# Patient Record
Sex: Male | Born: 1975
Health system: Southern US, Community
[De-identification: ages and names within clinical notes are randomized; demographics above are authoritative.]

## PROBLEM LIST (undated history)

## (undated) DIAGNOSIS — F419 Anxiety disorder, unspecified: Secondary | ICD-10-CM

## (undated) DIAGNOSIS — F32A Depression, unspecified: Secondary | ICD-10-CM

## (undated) DIAGNOSIS — R454 Irritability and anger: Secondary | ICD-10-CM

## (undated) DIAGNOSIS — K5721 Diverticulitis of large intestine with perforation and abscess with bleeding: Secondary | ICD-10-CM

## (undated) DIAGNOSIS — A6000 Herpesviral infection of urogenital system, unspecified: Secondary | ICD-10-CM

## (undated) DIAGNOSIS — E119 Type 2 diabetes mellitus without complications: Secondary | ICD-10-CM

## (undated) DIAGNOSIS — K5792 Diverticulitis of intestine, part unspecified, without perforation or abscess without bleeding: Secondary | ICD-10-CM

## (undated) DIAGNOSIS — I10 Essential (primary) hypertension: Secondary | ICD-10-CM

## (undated) HISTORY — DX: Diverticulitis of large intestine with perforation and abscess with bleeding: K57.21

## (undated) HISTORY — DX: Herpesviral infection of urogenital system, unspecified: A60.00

## (undated) HISTORY — DX: Irritability and anger: R45.4

---

## 1997-06-03 HISTORY — PX: REFRACTIVE SURGERY: SHX103

## 2005-06-26 ENCOUNTER — Ambulatory Visit: Payer: Self-pay | Admitting: General Surgery

## 2005-07-01 ENCOUNTER — Ambulatory Visit: Payer: Self-pay | Admitting: General Surgery

## 2005-07-25 ENCOUNTER — Ambulatory Visit: Payer: Self-pay | Admitting: General Surgery

## 2006-04-28 ENCOUNTER — Ambulatory Visit: Payer: Self-pay | Admitting: Internal Medicine

## 2007-03-19 ENCOUNTER — Emergency Department: Payer: Self-pay | Admitting: Emergency Medicine

## 2007-04-15 ENCOUNTER — Ambulatory Visit: Payer: Self-pay | Admitting: Gastroenterology

## 2013-10-28 ENCOUNTER — Ambulatory Visit: Payer: Self-pay | Admitting: Nurse Practitioner

## 2013-11-12 DIAGNOSIS — M7550 Bursitis of unspecified shoulder: Secondary | ICD-10-CM | POA: Insufficient documentation

## 2013-11-12 DIAGNOSIS — M7580 Other shoulder lesions, unspecified shoulder: Secondary | ICD-10-CM | POA: Insufficient documentation

## 2014-03-01 ENCOUNTER — Emergency Department: Payer: Self-pay | Admitting: Emergency Medicine

## 2014-03-01 LAB — URINALYSIS, COMPLETE
BACTERIA: NONE SEEN
Bilirubin,UR: NEGATIVE
Blood: NEGATIVE
Glucose,UR: NEGATIVE mg/dL (ref 0–75)
KETONE: NEGATIVE
LEUKOCYTE ESTERASE: NEGATIVE
NITRITE: NEGATIVE
Ph: 7 (ref 4.5–8.0)
Protein: NEGATIVE
RBC,UR: NONE SEEN /HPF (ref 0–5)
SPECIFIC GRAVITY: 1.016 (ref 1.003–1.030)
SQUAMOUS EPITHELIAL: NONE SEEN
WBC UR: NONE SEEN /HPF (ref 0–5)

## 2014-03-01 LAB — CBC WITH DIFFERENTIAL/PLATELET
Basophil #: 0 10*3/uL (ref 0.0–0.1)
Basophil %: 0.4 %
Eosinophil #: 0.1 10*3/uL (ref 0.0–0.7)
Eosinophil %: 1.2 %
HCT: 49.8 % (ref 40.0–52.0)
HGB: 15.9 g/dL (ref 13.0–18.0)
LYMPHS PCT: 12.4 %
Lymphocyte #: 1.4 10*3/uL (ref 1.0–3.6)
MCH: 29.4 pg (ref 26.0–34.0)
MCHC: 32 g/dL (ref 32.0–36.0)
MCV: 92 fL (ref 80–100)
MONO ABS: 1.3 x10 3/mm — AB (ref 0.2–1.0)
MONOS PCT: 11.6 %
NEUTROS ABS: 8.4 10*3/uL — AB (ref 1.4–6.5)
NEUTROS PCT: 74.4 %
Platelet: 338 10*3/uL (ref 150–440)
RBC: 5.42 10*6/uL (ref 4.40–5.90)
RDW: 14.2 % (ref 11.5–14.5)
WBC: 11.3 10*3/uL — ABNORMAL HIGH (ref 3.8–10.6)

## 2014-03-01 LAB — BASIC METABOLIC PANEL
Anion Gap: 7 (ref 7–16)
BUN: 12 mg/dL (ref 7–18)
CALCIUM: 9.1 mg/dL (ref 8.5–10.1)
CHLORIDE: 104 mmol/L (ref 98–107)
CREATININE: 0.65 mg/dL (ref 0.60–1.30)
Co2: 25 mmol/L (ref 21–32)
EGFR (African American): >60
GLUCOSE: 90 mg/dL (ref 65–99)
OSMOLALITY: 271 (ref 275–301)
POTASSIUM: 3.8 mmol/L (ref 3.5–5.1)
Sodium: 136 mmol/L (ref 136–145)

## 2014-03-01 LAB — TROPONIN I

## 2014-07-23 ENCOUNTER — Inpatient Hospital Stay: Payer: Self-pay | Admitting: Internal Medicine

## 2014-09-02 HISTORY — PX: COLONOSCOPY W/ POLYPECTOMY: SHX1380

## 2014-09-22 ENCOUNTER — Ambulatory Visit
Admit: 2014-09-22 | Disposition: A | Discharge status: Home / Self Care | Payer: Self-pay | Attending: Gastroenterology | Admitting: Gastroenterology

## 2014-09-22 LAB — HM COLONOSCOPY

## 2014-10-02 NOTE — Consult Note (Signed)
Brief Consult Note: Diagnosis: LGI bleed.   Comments: Pt currently in angio for embolization. I have not seen him. I've noted his multiple bloody BMs today and tagged RBC scan with splenic flexure positivity, and Hct drop. I have ordered coags and labs and we'll see him in the AM. Hopefully this procedure will be successful.  Electronic Signatures: Consuela Mimes (MD)  (Signed [phone] 17:14)  Authored: Brief Consult Note   Last Updated: 22-Feb-16 17:14 by Consuela Mimes (MD)

## 2014-10-02 NOTE — Consult Note (Signed)
Pt seen and examined. THis is his 4th bout of diverticulitis since 2008. Last colon in 2008 showed sigmoid diverticulosis with possible diverticulitis then. Avoid seeds and nuts. Starting to feel better already. On liquid diet. Still has some low abdominal tenderness. If stable, can try low residue diet by tomorrow. If tolerates solids, patient can be discharged on low residue diet x 1 week plus oral Abx. Needs repeat colonoscopy as outpt in a month or so. Pt should consider elective sigmoid colon resection as opposed to possible emergency colon surgery later in life. Thanks.  Electronic Signatures: Verdie Shire (MD)  (Signed on 20-Feb-16 12:04)  Authored  Last Updated: 20-Feb-16 12:04 by Verdie Shire (MD)

## 2014-10-02 NOTE — H&P (Signed)
PATIENT NAME:  Neil Mcintyre, BRICKLE MR#:  [id number] DATE OF BIRTH:  Nov 01, 1975  DATE OF ADMISSION:  07/23/2014  PRIMARY CARE PHYSICIAN: Derinda Late, MD   REFERRING EMERGENCY ROOM PHYSICIAN: Baird Cancer. Quale, MD  CHIEF COMPLAINT: Blood in the stool.   HISTORY OF PRESENTING ILLNESS: A 39 year old male who has a history of diverticula in the past and had bleeding, but last one was more than a few years ago. States that at 10:00 last night, he started suddenly having bleeding with his stool and he had 3 episodes like that overnight, so decided to come to the Emergency Room. Now he is in the ER with a stable hemoglobin but the episode of bleeding. The ER physician checked his CT scan, which showed diverticulitis. He WBC count is also slightly elevated, so given to hospitalist team for further management. On questioning, he denies any fever or chills. He denies any nausea or vomiting. He had slight discomfort in his abdomen on the left lower side. Denies any pain medications.   REVIEW OF SYSTEMS:  CONSTITUTIONAL: Negative for fever, fatigue, weakness, pain, or weight loss.  EYES: No blurring, double vision, discharge, or redness.  EARS, NOSE, THROAT: No tinnitus, ear pain, or hearing loss.  RESPIRATORY: No cough, wheezing, hemoptysis, or shortness of breath.  CARDIOVASCULAR: No chest pain, orthopnea, edema, arrhythmia, palpitation.  GASTROINTESTINAL: The patient does not have any nausea or vomiting, diarrhea, but had mild abdominal discomfort and some blood in the stool.  GENITOURINARY: No dysuria, hematuria, increased frequency.  ENDOCRINE: No heat or cold intolerance. No excessive sweating.  SKIN: No acne, rashes, or lesions.  MUSCULOSKELETAL: No pain or swelling in the joints.  NEUROLOGICAL: No numbness, weakness, tremor, or vertigo.  PSYCHIATRIC: No anxiety, insomnia, bipolar disorder.   PAST MEDICAL HISTORY:  1.  Hypertension.  2.  Anxiety.  3.  Herpes.   PAST SURGICAL HISTORY: Laser eye  surgery.   SOCIAL HISTORY: He does not smoke. Drinks alcohol socially. No illegal drug use.   FAMILY HISTORY: Unknown, because the patient was adopted.   HOME MEDICATIONS:  1.  Valacyclovir 500 mg oral once a day.  2.  Lorazepam 0.5 mg oral 1-2 tablets 2 times a day as needed.  3.  Escitalopram 10 mg oral tablet once a day.  4.  Amlodipine 5 mg orally once a day.   PHYSICAL EXAMINATION:  VITAL SIGNS: In ER, temperature 98.1; pulse rate is 122 on presentation, but currently 95; respiratory rate 19; blood pressure 175/114; and oxygen saturation 100% on room air.  GENERAL: The patient is fully alert and oriented to time, place, and person. Does not appear in any acute distress.  HEENT: Head and neck atraumatic. Conjunctivae pink. Oral mucosa moist.  NECK: Supple. No JVD. Thyroid nontender.  RESPIRATORY: Bilateral equal and clear air entry. No wheezing or crepitation.  CARDIOVASCULAR: S1, S2 present, regular. No murmur.  ABDOMEN: Soft, nontender. Bowel sounds present. No organomegaly.  SKIN: No acne, rashes, or lesions.  MUSCULOSKELETAL: No pain or swelling in the joints.  NEUROLOGICAL: No numbness, weakness, tremor, or vertigo.  PSYCHIATRIC: No  insomnia, bipolar disorder.  NEUROLOGICAL: Power 5/5. Follows commands. No gross abnormality. Sensation is intact. No tremor or rigidity.  JOINTS: No tenderness or swelling.   IMPORTANT LABORATORY RESULTS: Glucose 133, BUN 15, creatinine 0.73, sodium 141, potassium 4.2, chloride 108, CO2 of 26, calcium 9.1. Total protein 7.3, albumin 3.4, bilirubin 0.4, alkaline phosphate 80, SGOT 24, SGPT 28. WBC 12.8, hemoglobin 15.8, platelet count 272,000, MCV  is 90. CT scan of the abdomen and pelvis with contrast shows acute diverticulitis mild sigmoid colon, adjacent inflamed loop of small bowel may be secondary involved in adjacent inflammation. No abscess. No bowel obstruction. No extra luminal air.   ASSESSMENT AND PLAN: A 39 year old male who has history  of diverticular bleed in the past, came to hospital with complaint of rectal bleeding 3 times.  1.  Acute diverticulitis: We will give Cipro and Flagyl. We will keep him on liquid diet and call gastroenterology consult for further management.  2.  Gastrointestinal bleed: We will follow serial hemoglobin. Currently, hemoglobin is stable, so no need for a blood transfusion. Will continue monitoring. Call gastroenterology for further management.  3.  Hypertension: We will continue amlodipine.  4.  Herpes: Continue valacyclovir.  5.  Anxiety: Continue Xanax on an as-needed basis.   CODE STATUS: Full code.   TOTAL TIME SPENT ON THIS ADMISSION: 50 minutes.     ____________________________ Ceasar Lund Anselm Jungling, MD vgv:bm D: 07/23/2014 05:24:26 ET T: 07/23/2014 06:06:35 ET JOB#: [id number]  cc: Ceasar Lund. Anselm Jungling, MD, <Dictator> Derinda Late, MD Vaughan Basta MD ELECTRONICALLY SIGNED 08/15/2014 12:56

## 2014-10-02 NOTE — Consult Note (Signed)
CHIEF COMPLAINT and HISTORY:  Subjective/Chief Complaint Bleeding per rectum   History of Present Illness Patient with a history of diverticular disease admitted with significant lower GI bleeding.  His Hgb is actually down to 8.7 currently.  he has minimal discomfort.  Has N/V or constipation.  Stools are a little loose.  No inciting events and no alleviating events.  Bleeding scan was done which demonstrated positive bleeding in left colon and splenic flexure area.  I have independently reviewed this scan.  We are consulted for further evaluation and consideratio for embolization.   PAST MEDICAL/SURGICAL HISTORY:  Past Medical History:   Herpes:    anxiety:    hypertension:    diverticulosis:   ALLERGIES:  Allergies:  No Known Allergies:   HOME MEDICATIONS:  Home Medications: Medication Instructions Status  amLODIPine 5 mg oral tablet 1 tab(s) orally once a day Active  LORazepam 0.5 mg oral tablet 1-2 tab(s) orally 2 times a day, As Needed Active  valACYclovir 500 mg oral tablet 1 tab(s) orally once a day Active  Cymbalta 30 mg oral delayed release capsule 1 tab(s) orally once a day Active   Family and Social History:  Family History unknown as he was adopted   Social History negative tobacco, negative Illicit drugs, social ETOH   Review of Systems:  Subjective/Chief Complaint No TIA/stroke/seizure No heat or cold intolerance No dysuria/hematuria No blurry or double vision No tinnitus or ear pain No rashes or ulcer No suicidal ideation or psychosis No signs of easy bruising.  Bleeding per HPI No SOB/DOE, orthopnea, or sputum No palpitations or chest pain No N/V.  Bleeding per HPI No joint pain or joint swelling No fever or chills No unintentional weight loss or gain   Medications/Allergies Reviewed Medications/Allergies reviewed   Physical Exam:  GEN well developed, well nourished, no acute distress   HEENT pink conjunctivae, PERRL, hearing intact to voice    NECK No masses  trachea midline   RESP normal resp effort  no use of accessory muscles   CARD No LE edema  no JVD  tachycardic   VASCULAR ACCESS none   ABD soft  normal BS   GU no superpubic tenderness   LYMPH negative neck, negative axillae   EXTR negative cyanosis/clubbing, negative edema, pedal pulses palpable   SKIN normal to palpation, skin turgor good   NEURO cranial nerves intact, follows commands, motor/sensory function intact   PSYCH alert, A+O to time, place, person, good insight   LABS:  Laboratory Results: Hepatic:    19-Feb-16 23:21, Comprehensive Metabolic Panel  Bilirubin, Total 0.4  Alkaline Phosphatase 80  SGPT (ALT) 28  SGOT (AST) 24  Total Protein, Serum 7.3  Albumin, Serum 3.4  Routine BB:    19-Feb-16 23:21, Crossmatch 1 Unit  Crossmatch Unit 1 Issued  Result(s) reported on 25 Jul 2014 at 03:38PM.    19-Feb-16 23:21, Type and Antibody Screen  ABO Group + Rh Type   O Positive  Antibody Screen NEGATIVE  Result(s) reported on 23 Jul 2014 at 06:33AM.  Routine Micro:    20-Feb-16 06:13, Clostridium Difficile  Micro Text Report   CLOSTRIDIUM DIFFICILE    C.DIFFICILE ANTIGEN       C.DIFFICILE GDH ANTIGEN : NEGATIVE    C.DIFFICILE TOXIN A/B     C.DIFFICILE TOXINS A AND B : NEGATIVE    INTERPRETATION            Negative for C. difficile.      ANTIBIOTIC  Routine  Chem:    19-Feb-16 23:21, Comprehensive Metabolic Panel  Glucose, Serum 133  BUN 15  Creatinine (comp) 0.73  Sodium, Serum 141  Potassium, Serum 4.2  Chloride, Serum 108  CO2, Serum 26  Calcium (Total), Serum 9.1  Osmolality (calc) 284  eGFR (African American) >60  eGFR (Non-African American) >60  eGFR values <22m/min/1.73 m2 may be an indication of chronic  kidney disease (CKD).  Calculated eGFR, using the MRDR Study equation, is useful in   patients with stable renal function.  The eGFR calculation will not be reliable in acutely ill patients  when serum creatinine is  changing rapidly. It is not useful in  patients on dialysis. The eGFR calculation may not be applicable  to patients at the low and high extremes of body sizes, pregnant  women, and vegetarians.  Result Comment   POTASSIUM/BUN/AST - Slight hemolysis, interpret results with   - caution.   Result(s) reported on 22 Jul 2014 at 11:59PM.  Anion Gap 7    21-Feb-16 075:17 Basic Metabolic Panel (w/Total Calcium)  Glucose, Serum 102  BUN 12  Creatinine (comp) 0.75  Sodium, Serum 140  Potassium, Serum 3.8  Chloride, Serum 108  CO2, Serum 28  Calcium (Total), Serum 7.8  Anion Gap 4  Osmolality (calc) 279  eGFR (African American) >60  eGFR (Non-African American) >60  eGFR values <651mmin/1.73 m2 may be an indication of chronic  kidney disease (CKD).  Calculated eGFR, using the MRDR Study equation, is useful in   patients with stable renal function.  The eGFR calculation will not be reliable in acutely ill patients  when serum creatinine is changing rapidly. It is not useful in  patients on dialysis. The eGFR calculation may not be applicable  to patients at the low and high extremes of body sizes, pregnant  women, and vegetarians.    22-Feb-16 0900:17Basic Metabolic Panel (w/Total Calcium)  Glucose, Serum 103  BUN 7  Creatinine (comp) 0.79  Sodium, Serum 140  Potassium, Serum 3.9  Chloride, Serum 105  CO2, Serum 28  Calcium (Total), Serum 8.5  Anion Gap 7  Osmolality (calc) 278  eGFR (African American) >60  eGFR (Non-African American) >60  eGFR values <6095min/1.73 m2 may be an indication of chronic  kidney disease (CKD).  Calculated eGFR, using the MRDR Study equation, is useful in   patients with stable renal function.  The eGFR calculation will not be reliable in acutely ill patients  when serum creatinine is changing rapidly. It is not useful in  patients on dialysis. The eGFR calculation may not be applicable  to patients at the low and high extremes of body sizes,  pregnant  women, and vegetarians.  Routine Hem:    19-Feb-16 23:21, Hemogram, Platelet Count  WBC (CBC) 12.8  RBC (CBC) 5.32  Hemoglobin (CBC) 15.8  Hematocrit (CBC) 47.9  Platelet Count (CBC) 272  Result(s) reported on 22 Jul 2014 at 11:41PM.  MCV 90  MCH 29.7  MCHC 33.0  RDW 14.4    20-Feb-16 11:27, Hemoglobin  Hemoglobin (CBC) 12.2  Result(s) reported on 23 Jul 2014 at 11:45AM.    21-Feb-16 03:44, CBC Profile  WBC (CBC) 11.0  RBC (CBC) 3.24  Hemoglobin (CBC) 9.8  Hematocrit (CBC) 29.7  Platelet Count (CBC) 199  MCV 92  MCH 30.2  MCHC 33.0  RDW 14.3  Neutrophil % 65.9  Lymphocyte % 18.1  Monocyte % 13.5  Eosinophil % 2.1  Basophil % 0.4  Neutrophil # 7.3  Lymphocyte #  2.0  Monocyte # 1.5  Eosinophil # 0.2  Basophil # 0.0  Result(s) reported on 24 Jul 2014 at 04:11AM.    22-Feb-16 03:29, Hemoglobin  Hemoglobin (CBC) 9.6  Result(s) reported on 25 Jul 2014 at 04:11AM.    22-Feb-16 09:40, CBC Profile  WBC (CBC) 9.1  RBC (CBC) 3.34  Hemoglobin (CBC) 9.8  Hematocrit (CBC) 29.9  Platelet Count (CBC) 252  MCV 90  MCH 29.3  MCHC 32.6  RDW 14.1  Neutrophil % 69.5  Lymphocyte % 15.6  Monocyte % 13.0  Eosinophil % 1.5  Basophil % 0.4  Neutrophil # 6.3  Lymphocyte # 1.4  Monocyte # 1.2  Eosinophil # 0.1  Basophil # 0.0  Result(s) reported on 25 Jul 2014 at 09:54AM.    22-Feb-16 11:08, Hemoglobin  Hemoglobin (CBC) 8.7  Result(s) reported on 25 Jul 2014 at 11:27AM.   RADIOLOGY:  Radiology Results: LabUnknown:    28-May-15 14:56, MRI Shoulder Left Without Contrast  PACS Image    20-Feb-16 04:09, CT Abdomen and Pelvis With Contrast  PACS Image    22-Feb-16 14:14, GI Blood Loss Study - Nuc Med  PACS Image  MRI:    28-May-15 14:56, MRI Shoulder Left Without Contrast  MRI Shoulder Left Without Contrast  REASON FOR EXAM:    Possible biceps tendon rupture left  COMMENTS:       PROCEDURE: MR  - MR SHOULDER LT  WO CONTRAST  - Oct 28 2013  2:56PM      CLINICAL DATA:  biceps tendon rupture. Injury 2 months ago lifting  an object.    EXAM:  MRI OF THE LEFT SHOULDER WITHOUT CONTRAST    TECHNIQUE:  Multiplanar, multisequence MR imaging of the shoulder was performed.  No intravenous contrast was administered.  COMPARISON:  None.    FINDINGS:  Rotator cuff: Supraspinatus tendinopathy is present without tear.  Infraspinatus and teres minor tendons appear normal. The  subscapularis tendon appears normal.    Muscles:  Normal.  No atrophy or edema.    Biceps long head: Intact. Insertion on the biceps labral complex  appears normal.    Acromioclavicular Joint: Type 3 acromion. Active AC joint  osteoarthritis is present with a small effusion, appear articular  inflammation, subacromial bursitis and bone marrow edema on both  sides of the joint.    Glenohumeral Joint: Normal.    Labrum:  Normal.    Bones:No destructive osseous lesions.     IMPRESSION:  1. Intact biceps long head tendon.  2. Supraspinatus tendinopathy without tear.  3. Active AC joint osteoarthritis, with bone marrow edema on both  sides of the joint. Subacromial bursitis and type 3 acromion.    Electronically Signed    By: Dereck Ligas M.D.    On: 10/28/2013 15:17         Verified By: Melvyn Novas, M.D.,  CT:    20-Feb-16 04:09, CT Abdomen and Pelvis With Contrast  CT Abdomen and Pelvis With Contrast  REASON FOR EXAM:    (1) LLQ pain; (2) same  COMMENTS:   May transport without cardiac monitor    PROCEDURE: CT  - CT ABDOMEN / PELVIS  W  - Jul 23 2014  4:09AM     CLINICAL DATA:  Rectal bleeding. Abdominal discomfort. Leukocytosis.    EXAM:  CT ABDOMEN AND PELVIS WITH CONTRAST    TECHNIQUE:  Multidetector CT imaging of the abdomen and pelvis was performed  using the standard protocol following bolus administration of  intravenous contrast.  CONTRAST:  100 mL Omnipaque 300 intravenous    COMPARISON:  04/28/2006    FINDINGS:  There  is acute inflammatory change in the mid sigmoid colon with  appearances typical of acute diverticulitis. No extraluminal air is  evident. There is no abscess. There is very extensive diverticulosis  throughout the sigmoid and descending colon. There is a prominent  small bowel loop directly adjacent to the inflamed sigmoid. The  small bowel loop may be secondarily involved in the inflammation  there is no small bowel obstruction. Remainder of the small bowel  appears normal. The appendix is normal.    There is no ascites. There are normal appearances of the liver,  spleen, pancreas, adrenals and kidneys. There is no adenopathy. No  significant abnormalities are evident in the lower chest.     IMPRESSION:  Acute diverticulitis, mid sigmoid colon. Adjacent inflamed loop of  small bowel may be secondarily involved in the adjacent  inflammation. No abscess. No bowel obstruction. No extraluminal air.      Electronically Signed    By: Andreas Newport M.D.  On: 07/23/2014 04:29         Verified By: Andreas Newport, M.D.,  Nuclear Med:    [phone] 14:14, GI Blood Loss Study - Nuc Med  GI Blood Loss Study - Nuc Med  REASON FOR EXAM:    ACTIVE BLEEDING  COMMENTS:       PROCEDURE: NM  - NM GI BLOOD LOSS STUDY  - Jul 25 2014  2:14PM     CLINICAL DATA:  Active GI bleeding.    EXAM:  NUCLEAR MEDICINE GASTROINTESTINAL BLEEDING SCAN    TECHNIQUE:  Sequential abdominal images were obtained following intravenous  administration of Tc-62mlabeled red blood cells.    RADIOPHARMACEUTICALS:  22.0 mCi Tc-980mn-vitro labeled red cells.  COMPARISON:  Abdominal CT 05/23/2015    FINDINGS:  There is accumulation of the radiopharmaceutical in the left upper  quadrant. This activity moves into the left lower abdomen and  appears to be within the splenic flexure and descending colon. There  is expected uptake in the vascular structures and liver. There is  accumulation of the  radiopharmaceutical in the pelvis and this is  thought to represent urinary bladder rather than sigmoid colon.     IMPRESSION:  Positive GI bleeding study. The area bleeding is near the splenic  flexure and proximal descending colon.    Electronically Signed    By: AdMarkus Daft.D.    On: 07/25/2014 14:30         Verified By: ADBurman RiisM.D.,   ASSESSMENT AND PLAN:  Assessment/Admission Diagnosis lower GI bleed with positive bleeding scan, likely diverticular in nature   Plan Bleeding scan was done which demonstrated positive bleeding in left colon and splenic flexure area.  I have independently reviewed this scan.  I have discussed options including observation, embolization, or surgery.  Surgery would be most definitive, but most invasive.  Already anemic and active bleeding, so observation likely not a good option.  discussed risks and benefits of embolization.  Discussed ischemic complications and rebleeding risk.  He is interested in proceeding with embolization and will plan later today.     level 4 consult   Electronic Signatures: DeAlgernon HuxleyMD)  (Signed [phone] [id number])  Authored: Chief Complaint and History, PAST MEDICAL/SURGICAL HISTORY, ALLERGIES, HOME MEDICATIONS, Family and Social History, Review of Systems, Physical Exam, LABS, RADIOLOGY, Assessment and Plan  Last Updated: 22-Feb-16 15:56 by Algernon Huxley (MD)

## 2014-10-02 NOTE — Op Note (Signed)
PATIENT NAME:  Neil Mcintyre, Neil Mcintyre MR#:  [id number] DATE OF BIRTH:  Jun 06, 1975  DATE OF PROCEDURE:  07/25/2014  PREOPERATIVE DIAGNOSIS:  Brisk lower GI bleeding likely from diverticulosis with positive bleeding scan specific to the splenic flexure and left colon.   POSTOPERATIVE DIAGNOSIS:  Brisk lower GI bleeding likely from diverticulosis with positive bleeding scan specific to the splenic flexure and left colon.   PROCEDURES:  1.  Ultrasound guidance for vascular access to right femoral artery.  2.  Catheter placement into third order IMA branch to the splenic flexure.  3.  Aortogram and selective inferior mesenteric artery arteriogram.  4.  Microbead embolization with 300-500 micron polyvinyl alcohol beads to the splenic flexure and left colon.   SURGEON: Algernon Huxley, M.D.   ANESTHESIA: Local with moderate conscious sedation.   ESTIMATED BLOOD LOSS: 25 mL   CONTRAST: 50 mL Visipaque    INDICATION FOR PROCEDURE: This is a 39 year old gentleman who we were consulted on today for brisk lower GI bleeding. He had a bleeding scan which was positive for the splenic flexure and proximal left colon. We discussed with him options including observation, surgery and embolization. He preferred to have an embolization. Risks and benefits including ischemia, recurrent bleeding and other problems were discussed and informed consent was obtained.   DESCRIPTION OF PROCEDURE: The patient is brought to the vascular suite. Groins were shaved and prepped, and a sterile surgical field was created. The right femoral artery was visualized with ultrasound and found to be widely patent. It was then accessed under direct ultrasound guidance without difficulty with a Seldinger needle. A J-wire and 5 French sheath were placed. Pigtail catheter was placed into the aorta and RAO and steep LAO projection were required as it ended up being a very steep LAO projection that allowed [place] to see the origin of the IMA.  Given his  left colon and splenic flexure location this was the artery in question. A VS-1 catheter was used to selectively cannulate the IMA and selective imaging was then performed. This showed primary branches of the IMA going to the rectosigmoid area, sigmoid, left colon off of the left colic branch was a very superior branch going to the splenic flexure. This was essentially a third order branch of the IMA. I then used a Progreat microcatheter to navigate through the primary branch, secondary branch and into the left colic branch and up into the tertiary branch being the splenic flexure branch. Imaging was performed which showed direct opacification of the splenic flexure with collateralization of the proximal left colon and the distal transverse colon. In this location approximately 2 mL of 300-500 micron polyvinyl alcohol beads were instilled and completion angiogram showed the main vessels to be open with less brisk flow into the area after embolization. I then removed the diagnostic catheters and placed j wire. After the femoral sheath was removed StarClose closure device was used in the usual fashion with excellent hemostatic result. The patient tolerated the procedure well and was taken to the recovery room in stable condition.    ____________________________ Algernon Huxley, MD jsd:mc D: 07/25/2014 17:53:02 ET T: 07/26/2014 09:44:39 ET JOB#: [id number]  cc: Algernon Huxley, MD, <Dictator> Algernon Huxley MD ELECTRONICALLY SIGNED 08/11/2014 10:34

## 2014-10-02 NOTE — Consult Note (Signed)
Chief Complaint:  Subjective/Chief Complaint Some bleeding yesterday and this AM. Clinically looks ok. NO abdomial pain. However, significant drop in hgb.   VITAL SIGNS/ANCILLARY NOTES: **Vital Signs.:   21-Feb-16 08:21  Vital Signs Type Q 8hr  Temperature Temperature (F) 97.9  Celsius 36.6  Temperature Source oral  Pulse Pulse 79  Respirations Respirations 18  Systolic BP Systolic BP 229  Diastolic BP (mmHg) Diastolic BP (mmHg) 92  Mean BP 112  Pulse Ox % Pulse Ox % 97  Pulse Ox Activity Level  At rest  Oxygen Delivery Room Air/ 21 %   Brief Assessment:  GEN no acute distress   Cardiac Regular   Respiratory clear BS   Gastrointestinal Normal   Lab Results: Routine Chem:  21-Feb-16 03:44   Glucose, Serum  102  BUN 12  Creatinine (comp) 0.75  Sodium, Serum 140  Potassium, Serum 3.8  Chloride, Serum  108  CO2, Serum 28  Calcium (Total), Serum  7.8  Anion Gap  4  Osmolality (calc) 279  eGFR (African American) >60  eGFR (Non-African American) >60 (eGFR values <35m/min/1.73 m2 may be an indication of chronic kidney disease (CKD). Calculated eGFR, using the MRDR Study equation, is useful in  patients with stable renal function. The eGFR calculation will not be reliable in acutely ill patients when serum creatinine is changing rapidly. It is not useful in patients on dialysis. The eGFR calculation may not be applicable to patients at the low and high extremes of body sizes, pregnant women, and vegetarians.)  Routine Hem:  21-Feb-16 03:44   WBC (CBC)  11.0  RBC (CBC)  3.24  Hemoglobin (CBC)  9.8  Hematocrit (CBC)  29.7  Platelet Count (CBC) 199  MCV 92  MCH 30.2  MCHC 33.0  RDW 14.3  Neutrophil % 65.9  Lymphocyte % 18.1  Monocyte % 13.5  Eosinophil % 2.1  Basophil % 0.4  Neutrophil #  7.3  Lymphocyte # 2.0  Monocyte #  1.5  Eosinophil # 0.2  Basophil # 0.0 (Result(s) reported on 24 Jul 2014 at 04:11AM.)   Assessment/Plan:  Assessment/Plan:   Assessment Diverticulitis. Drop in hgb either from hydration and/or further rectal bleeding.   Plan Hold off advancing diet to low residue diet. Moniter hgb for next 24 hrs. If bleeding does stop, then can try low residue diet tomorrow. Pt not a candidate for urgent colonoscopy. Will check back tomorrow. thanks   Electronic Signatures: OVerdie Shire(MD)  (Signed 21-Feb-16 10:52)  Authored: Chief Complaint, VITAL SIGNS/ANCILLARY NOTES, Brief Assessment, Lab Results, Assessment/Plan   Last Updated: 21-Feb-16 10:52 by OVerdie Shire(MD)

## 2014-10-02 NOTE — Consult Note (Signed)
PATIENT NAME:  Neil Mcintyre, Neil Mcintyre MR#:  [id number] DATE OF BIRTH:  09/13/1975  DATE OF CONSULTATION:  07/23/2014  REFERRING PHYSICIAN:   CONSULTING PHYSICIAN:  Lupita Dawn. Candace Cruise, MD  REASON FOR REFERRAL: Recurrent diverticulitis.   DESCRIPTION: The patient is a 39 year old, white male, who presents with rectal bleeding and abdominal pain. A CT scan showed evidence of acute diverticulitis. Therefore, the patient was brought in for antibiotic coverage and monitoring.   According to the patient, this is his fourth episode of diverticulitis. His first episode was in 2008 when he was seen by Dr. Sonny Masters. He had a colonoscopy that showed sigmoid diverticulosis with some evidence of diverticulitis. Ever since then, he has been avoiding seeds and nuts. Unfortunately, he keeps on having recurrent diverticulitis. Fortunately, for the patient, the patient is feeling better already. He is currently on a liquid diet and tolerating that so far.   REVIEW OF SYSTEMS: Please refer to the review of symptoms dictated on admission. There are no significant changes.   PAST MEDICAL HISTORY: Notable for anxiety, herpes and hypertension.   PAST SURGICAL HISTORY: Laser surgery for eyes.   SOCIAL HISTORY: There is no tobacco. The patient does drink alcohol socially.   FAMILY HISTORY: Negative since the patient is unknown because he was adopted.   MEDICATIONS AT HOME: Include Lorazepam as needed, amlodipine 5 mg daily, valacyclovir 500 mg daily.   PHYSICAL EXAMINATION:  GENERAL: The patient is in no acute distress.  VITAL SIGNS: His vital signs are stable. He is afebrile.  HEAD AND NECK: Within normal limits.  CARDIAC: Regular rhythm and rate without murmur.  LUNGS: Clear bilaterally.  ABDOMEN: Showed normoactive bowel sounds, soft. There is mild tenderness in the lower abdomen. He has good bowel sounds. There is no rebound or guarding.  EXTREMITIES: Showed no clubbing, cyanosis, or edema.  SKIN: Negative.  NEUROLOGICAL:  Examination is nonfocal.   LABORATORIES: White count is 12.8, hemoglobin 15.8. Electrolytes are all within normal limits. Liver enzymes are within normal limits.   CT scan of the abdomen showed acute inflammatory process around sigmoid colon consistent with typical acute diverticulitis.   ASSESSMENT AND PLAN: A patient with recurrent diverticulitis. He is doing better on antibiotics. If he stays stable, we will try a low residue diet by tomorrow. If he can tolerate a low residue diet, the patient can be discharged to home on a low residue diet for the next week or so. He can also continue oral antibiotics for at least a 7-day duration. Because this keeps on happening, the patient should consider elective surgical resection to prevent another episode like this since the patient is so young, at this time. This may prevent acute emergency surgery if he ever develops diverticulitis with complications.   Thank you for the referral.    ____________________________ Lupita Dawn. Candace Cruise, MD pyo:JT D: 07/24/2014 08:26:57 ET T: 07/24/2014 11:09:26 ET JOB#: [id number]  cc: Lupita Dawn. Candace Cruise, MD, <Dictator> Lupita Dawn Eldon Zietlow MD ELECTRONICALLY SIGNED 07/27/2014 10:51

## 2014-10-02 NOTE — Discharge Summary (Signed)
PATIENT NAME:  Neil Mcintyre, Neil Mcintyre MR#:  [id number] DATE OF BIRTH:  [birth year]  DATE OF ADMISSION:  07/23/2014 DATE OF DISCHARGE:  07/28/2014  ADMITTING PHYSICIAN: Vaughan Basta, MD  DISCHARGING PHYSICIAN: [name], MD  PRIMARY CARE PHYSICIAN: Dr. Baldemar Lenis  CONSULTANTS IN HOSPITAL:  1.  Gastroenterology - Dr. Candace Cruise. 2.  Vascular - Dr. Lucky Cowboy.  3.  Surgery - Dr. Leanora Cover.   DISCHARGE DIAGNOSES: 1.  Acute anemia requiring 3 units of packed RBC transfusion secondary to gastrointestinal bleed. 2.  Lower gastrointestinal bleed with positive GI bleeding scan, status post embolization of splenic branch of inferior mesenteric artery. 3.  Hypertension.  4.  Acute sigmoid colon diverticulitis.  DISCHARGE HOME MEDICATIONS:  1.  Amlodipine 5 mg p.o. daily.  2.  Ativan 0.5 mg 1 to 2 tablets orally twice a day as needed.  3.  Valacyclovir 500 mg p.o. daily, finished a course as recommended.  4.  Cymbalta 30 mg p.o. daily.  5.  Flagyl 500 mg p.o. q. 8 hours for 5 more days.  6.  Ciprofloxacin 500 mg p.o. b.i.d. for 5 more days.  7.  Ferrous sulfate 325 mg p.o. b.i.d.   DISCHARGE DIET: Regular, low residue.  DISCHARGE ACTIVITY: As tolerated.    FOLLOWUP INSTRUCTIONS: 1.  Gastroenterology follow-up in 2 to 3 weeks.  2.  PCP follow-up in 1 week.  3.  Advised to eat iron rich foods.  DIAGNOSTIC DATA: Prior to discharge: WBC 15.4, hemoglobin 9.6, hematocrit 29.3, platelet count 289,000.   Sodium 137, potassium 3.3, chloride 103, bicarb 29, BUN 6, creatinine 0.75, glucose 88 and calcium 8.6. INR 1.2.   GI bleeding scan done on July 25, 2014 showing positive study. Area of bleeding is near the splenic flexure and proximal descending colon.  Baseline hemoglobin on admission seems to be around 15. Clostridium difficile study is negative.  CT of the abdomen and pelvis is showing acute diverticulitis in the mid sigmoid colon. Inflamed loop of small bowel may be secondarily involved in  the adjacent inflammation, but no abscess seen.   BRIEF HOSPITAL COURSE: Neil Mcintyre is a 39 year old pleasant Caucasian male with past medical history significant for hypertension, recurrent diverticulitis episodes, at least 4 including this one, presents to the hospital secondary to abdominal pain and bloody stools.  1.  Acute recurrent diverticulitis presenting with abdominal pain as well as bloody stools. Started on Cipro and Flagyl. His bleeding has spontaneously resolved. The patient started to improve until he started having ongoing bloody diarrhea again. He is being discharged on Cipro and Flagyl to finish off his course.  2.  Lower GI bleed with active bleeding. The patient actually had a syncopal episode, hypotension, transported to critical care unit, had a bleeding scan done which showed positive study with bleeding in the splenic flexure area. Seen by vascular and had embolization of the splenic branch of the IMA done with improvement in bleeding. He needed a total of 3 units of packed RBCs transfusion this admission for his acute anemia. Again, as mentioned, hemoglobin has steadily dropped from 15 to 10 to 7, and he is stable at 9.6 at the time of discharge.  3.  Hypertension. His Norvasc has been restarted prior to discharge.  His course has been otherwise uneventful in the hospital.   DISCHARGE CONDITION: Stable.   DISCHARGE DISPOSITION: Home.   TIME SPENT ON DISCHARGE: 40 minutes.   ____________________________ [name], MD rk:sb D: 07/28/2014 15:30:08 ET T: 07/29/2014 15:08:54 ET JOB#: [id number]  cc:  [name], MD, <Dictator> Derinda Late, MD [name] MD ELECTRONICALLY SIGNED 08/18/2014 17:46

## 2014-10-02 NOTE — Consult Note (Signed)
Chief Complaint:  Subjective/Chief Complaint No further bleeding. Started full liquid diet. No abdominal pain.   VITAL SIGNS/ANCILLARY NOTES: **Vital Signs.:   24-Feb-16 05:15  Vital Signs Type Routine  Temperature Temperature (F) 98.2  Celsius 36.7  Temperature Source oral  Pulse Pulse 102  Respirations Respirations 20  Systolic BP Systolic BP 235  Diastolic BP (mmHg) Diastolic BP (mmHg) 77  Mean BP 573  Systolic BP Systolic BP 220  Diastolic BP (mmHg) Diastolic BP (mmHg) 79  Systolic BP Systolic BP 254  Diastolic BP (mmHg) Diastolic BP (mmHg) 78  Systolic BP Systolic BP 77  Diastolic BP (mmHg) Diastolic BP (mmHg) 54  Pulse Ox % Pulse Ox % 100  Oxygen Delivery Room Air/ 21 %   Brief Assessment:  GEN no acute distress   Cardiac Regular   Respiratory clear BS   Gastrointestinal Normal   Lab Results:  Routine Chem:  23-Feb-16 06:30   Glucose, Serum  128  BUN 10  Creatinine (comp) 0.81  Sodium, Serum 140  Potassium, Serum 3.5  Chloride, Serum 107  CO2, Serum 26  Calcium (Total), Serum  7.8  Anion Gap 7  Osmolality (calc) 280  eGFR (African American) >60  eGFR (Non-African American) >60 (eGFR values <26m/min/1.73 m2 may be an indication of chronic kidney disease (CKD). Calculated eGFR, using the MRDR Study equation, is useful in  patients with stable renal function. The eGFR calculation will not be reliable in acutely ill patients when serum creatinine is changing rapidly. It is not useful in patients on dialysis. The eGFR calculation may not be applicable to patients at the low and high extremes of body sizes, pregnant women, and vegetarians.)  Result Comment Differential - SMEAR SCANNED  Result(s) reported on 26 Jul 2014 at 08:15AM.  Routine Coag:  23-Feb-16 06:30   Prothrombin  16.0 (11.4-15.0 NOTE: New Reference Range  07/01/14)  INR 1.3 (INR reference interval applies to patients on anticoagulant therapy. A single INR therapeutic range for coumarins  is not optimal for all indications; however, the suggested range for most indications is 2.0 - 3.0. Exceptions to the INR Reference Range may include: Prosthetic heart valves, acute myocardial infarction, prevention of myocardial infarction, and combinations of aspirin and anticoagulant. The need for a higher or lower target INR must be assessed individually. Reference: The Pharmacology and Management of the Vitamin K  antagonists: the seventh ACCP Conference on Antithrombotic and Thrombolytic Therapy. CYHCWC.3762Sept:126 (3suppl): [id number] A HCT value >55% may artifactually increase the PT.  In one study,  the increase was an average of 25%. Reference:  "Effect on Routine and Special Coagulation Testing Values of Citrate Anticoagulant Adjustment in Patients with High HCT Values." American Journal of Clinical Pathology 2006;126:400-405.)  Activated PTT (APTT) 28.8 (A HCT value >55% may artifactually increase the APTT. In one study, the increase was an average of 19%. Reference: "Effect on Routine and Special Coagulation Testing Values of Citrate Anticoagulant Adjustment in Patients with High HCT Values." American Journal of Clinical Pathology 2006;126:400-405.)  Fibrinogen 335 (A HCT value >55% may artifactually increase the Fibrinogen.  In one study, the increase was an average of 10%. Reference:  "Effect of Routine and Special Coagulation Testing Values of Citrate Anticoagulant Adjustment in Patients with High HCT Values." American Journal of Clinical Pathology 2006;126:400-405.)  Routine Hem:  23-Feb-16 06:30   WBC (CBC)  15.9  RBC (CBC)  2.62  Hemoglobin (CBC)  7.6  Hematocrit (CBC)  23.2  Platelet Count (CBC) 211  MCV 89  MCH 28.9  MCHC 32.6  RDW  15.0  Neutrophil % 75.4  Lymphocyte % 9.5  Monocyte % 14.3  Eosinophil % 0.7  Basophil % 0.1  Neutrophil #  12.0  Lymphocyte # 1.5  Monocyte #  2.3  Eosinophil # 0.1  Basophil # 0.0  24-Feb-16 05:03   WBC (CBC)  16.4   RBC (CBC)  2.69  Hemoglobin (CBC)  7.7  Hematocrit (CBC)  23.8  Platelet Count (CBC) 231 (Result(s) reported on 27 Jul 2014 at [facility].)  MCV 88  MCH 28.8  MCHC 32.5  RDW  15.5  Segmented Neutrophils 79  Lymphocytes 8  Monocytes 11  Eosinophil 2  NRBC 1  Diff Comment 1 ANISOCYTOSIS  Diff Comment 2 POLYCHROMASIA  Diff Comment 3 PLTS VARIED IN SIZE  Result(s) reported on 27 Jul 2014 at [facility]   Assessment/Plan:  Assessment/Plan:  Assessment Diverticulitis and diverticular bleeding. Stable now.   Plan If tolerates full liquid diet today, can advance to low residue diet. If tolerates solids, ok for discharge on low residue diet for few more days, then high fiber diet. Abx for 7-10 days course. Also, start Fe supplements.  I will be out of town starting tomorrow. If there are issues, contact GI on call. Have patient f/u with me as outpt. Will schedule elective colon as outpt in one month. thanks.   Electronic Signatures: Verdie Shire (MD)  (Signed 24-Feb-16 11:07)  Authored: Chief Complaint, VITAL SIGNS/ANCILLARY NOTES, Brief Assessment, Lab Results, Assessment/Plan   Last Updated: 24-Feb-16 11:07 by Verdie Shire (MD)

## 2014-10-02 NOTE — Consult Note (Signed)
Chief Complaint:  Subjective/Chief Complaint Acute LGI bleeding earlier with drop in hgb and BP, resulting in loss of consciousness. Bleeding scan positive. Still passing some blood. Pt now in ICU.   VITAL SIGNS/ANCILLARY NOTES: **Vital Signs.:   22-Feb-16 15:21  Vital Signs Type Pre-Blood  Temperature Temperature (F) 98.4  Celsius 36.8  Temperature Source oral  Pulse Pulse 143  Respirations Respirations 24  Systolic BP Systolic BP 144  Diastolic BP (mmHg) Diastolic BP (mmHg) 80  Mean BP 102  Pulse Ox % Pulse Ox % 100  Oxygen Delivery Room Air/ 21 %   Brief Assessment:  GEN no acute distress   Cardiac Regular   Respiratory clear BS   Gastrointestinal Normal   Lab Results: Routine Chem:  22-Feb-16 09:40   Glucose, Serum  103  BUN 7  Creatinine (comp) 0.79  Sodium, Serum 140  Potassium, Serum 3.9  Chloride, Serum 105  CO2, Serum 28  Calcium (Total), Serum 8.5  Anion Gap 7  Osmolality (calc) 278  eGFR (African American) >60  eGFR (Non-African American) >60 (eGFR values <22m/min/1.73 m2 may be an indication of chronic kidney disease (CKD). Calculated eGFR, using the MRDR Study equation, is useful in  patients with stable renal function. The eGFR calculation will not be reliable in acutely ill patients when serum creatinine is changing rapidly. It is not useful in patients on dialysis. The eGFR calculation may not be applicable to patients at the low and high extremes of body sizes, pregnant women, and vegetarians.)  Routine Hem:  22-Feb-16 09:40   Hemoglobin (CBC)  9.8  WBC (CBC) 9.1  RBC (CBC)  3.34  Hematocrit (CBC)  29.9  Platelet Count (CBC) 252  MCV 90  MCH 29.3  MCHC 32.6  RDW 14.1  Neutrophil % 69.5  Lymphocyte % 15.6  Monocyte % 13.0  Eosinophil % 1.5  Basophil % 0.4  Neutrophil # 6.3  Lymphocyte # 1.4  Monocyte #  1.2  Eosinophil # 0.1  Basophil # 0.0 (Result(s) reported on 25 Jul 2014 at 09:54AM.)   Radiology Results: Nuclear Med:   22-Feb-16 14:14, GI Blood Loss Study - Nuc Med  GI Blood Loss Study - Nuc Med   REASON FOR EXAM:    ACTIVE BLEEDING  COMMENTS:       PROCEDURE: NM  - NM GI BLOOD LOSS STUDY  - Jul 25 2014  2:14PM     CLINICAL DATA:  Active GI bleeding.    EXAM:  NUCLEAR MEDICINE GASTROINTESTINAL BLEEDING SCAN    TECHNIQUE:  Sequential abdominal images were obtained following intravenous  administration of Tc-962mabeled red blood cells.    RADIOPHARMACEUTICALS:  22.0 mCi Tc-9985m-vitro labeled red cells.  COMPARISON:  Abdominal CT 05/23/2015    FINDINGS:  There is accumulation of the radiopharmaceutical in the left upper  quadrant. This activity moves into the left lower abdomen and  appears to be within the splenic flexure and descending colon. There  is expected uptake in the vascular structures and liver. There is  accumulation of the radiopharmaceutical in the pelvis and this is  thought to represent urinary bladder rather than sigmoid colon.     IMPRESSION:  Positive GI bleeding study. The area bleeding is near the splenic  flexure and proximal descending colon.    Electronically Signed    By: AdaMarkus DaftD.    On: 07/25/2014 14:30         Verified By: ADABurman Riis.D.,   Assessment/Plan:  Assessment/Plan:  Assessment Diverticulitis with diverticular bleed.   Plan For angiography with possible embolization later. Continue Abx. thanks   Electronic Signatures: Verdie Shire (MD)  (Signed [phone] 15:34)  Authored: Chief Complaint, VITAL SIGNS/ANCILLARY NOTES, Brief Assessment, Lab Results, Radiology Results, Assessment/Plan   Last Updated: 22-Feb-16 15:34 by Verdie Shire (MD)

## 2014-11-03 ENCOUNTER — Encounter: Payer: Self-pay | Admitting: Emergency Medicine

## 2014-11-03 ENCOUNTER — Emergency Department: Payer: Worker's Compensation

## 2014-11-03 ENCOUNTER — Emergency Department
Admission: EM | Admit: 2014-11-03 | Discharge: 2014-11-03 | Disposition: A | Discharge status: Home / Self Care | Payer: Worker's Compensation | Attending: Emergency Medicine | Admitting: Emergency Medicine

## 2014-11-03 DIAGNOSIS — I10 Essential (primary) hypertension: Secondary | ICD-10-CM | POA: Insufficient documentation

## 2014-11-03 DIAGNOSIS — Y9389 Activity, other specified: Secondary | ICD-10-CM | POA: Diagnosis not present

## 2014-11-03 DIAGNOSIS — S99921A Unspecified injury of right foot, initial encounter: Secondary | ICD-10-CM | POA: Diagnosis present

## 2014-11-03 DIAGNOSIS — Z87891 Personal history of nicotine dependence: Secondary | ICD-10-CM | POA: Diagnosis not present

## 2014-11-03 DIAGNOSIS — W231XXA Caught, crushed, jammed, or pinched between stationary objects, initial encounter: Secondary | ICD-10-CM | POA: Diagnosis not present

## 2014-11-03 DIAGNOSIS — S9031XA Contusion of right foot, initial encounter: Secondary | ICD-10-CM

## 2014-11-03 DIAGNOSIS — Y9289 Other specified places as the place of occurrence of the external cause: Secondary | ICD-10-CM | POA: Diagnosis not present

## 2014-11-03 DIAGNOSIS — Y990 Civilian activity done for income or pay: Secondary | ICD-10-CM | POA: Insufficient documentation

## 2014-11-03 HISTORY — DX: Essential (primary) hypertension: I10

## 2014-11-03 MED ORDER — IBUPROFEN 800 MG PO TABS
800.0000 mg | ORAL_TABLET | Freq: Once | ORAL | Status: AC
  Administered 2014-11-03: 800 mg via ORAL

## 2014-11-03 MED ORDER — TRAMADOL HCL 50 MG PO TABS
50.0000 mg | ORAL_TABLET | Freq: Four times a day (QID) | ORAL | Status: DC | PRN
Start: 2014-11-03 — End: 2015-05-09

## 2014-11-03 MED ORDER — OXYCODONE-ACETAMINOPHEN 5-325 MG PO TABS
1.0000 | ORAL_TABLET | Freq: Once | ORAL | Status: AC
  Administered 2014-11-03: 1 via ORAL

## 2014-11-03 MED ORDER — IBUPROFEN 800 MG PO TABS
ORAL_TABLET | ORAL | Status: AC
  Filled 2014-11-03: qty 1

## 2014-11-03 MED ORDER — IBUPROFEN 800 MG PO TABS: 800.0000 mg | ORAL_TABLET | Freq: Three times a day (TID) | ORAL | Status: DC | PRN

## 2014-11-03 MED ORDER — OXYCODONE-ACETAMINOPHEN 5-325 MG PO TABS
ORAL_TABLET | ORAL | Status: AC
  Administered 2014-11-03: 1 via ORAL
  Filled 2014-11-03: qty 1

## 2014-11-03 NOTE — ED Notes (Signed)
Pt informed to return if any life threatening symptoms occur.  

## 2014-11-03 NOTE — Discharge Instructions (Signed)
Advised 3 day BP check due to elevate Blood Pressure today.

## 2014-11-03 NOTE — ED Notes (Signed)
Pt arrived to the ED for complaints of right foot pain after sustaining an injury at work when a loaded pallet ran over his foot. Pt sensation and circulation are intact, range of motion is limited on affected foot. Pt is AOx4 in no apparent distress complaining of 7/10 pain.

## 2014-11-03 NOTE — ED Provider Notes (Signed)
[facility] Emergency Department Provider Note  ____________________________________________  Time seen: Approximately 8:15 PM  I have reviewed the triage vital signs and the nursing notes.   HISTORY  Chief Complaint Foot Injury    HPI Neil Mcintyre is a 39 y.o. male patient complaining of right foot pain secondary to a pallor ran over his foot at work prior to arrival. Patient states pain increases with ambulation. Patient denies any loss of sensation or loss of motion. Patient is rating his pain as a 7/10.   Past Medical History  Diagnosis Date  . Hypertension     There are no active problems to display for this patient.   Past Surgical History  Procedure Laterality Date  . Colonoscopy w/ polypectomy      Current Outpatient Rx  Name  Route  Sig  Dispense  Refill  . ibuprofen (ADVIL,MOTRIN) 800 MG tablet   Oral   Take 1 tablet (800 mg total) by mouth every 8 (eight) hours as needed for moderate pain.   15 tablet   0   . traMADol (ULTRAM) 50 MG tablet   Oral   Take 1 tablet (50 mg total) by mouth every 6 (six) hours as needed for moderate pain.   12 tablet   0     Allergies Review of patient's allergies indicates no known allergies.  History reviewed. No pertinent family history.  Social History History  Substance Use Topics  . Smoking status: Former Research scientist (life sciences)  . Smokeless tobacco: Not on file  . Alcohol Use: 0.6 oz/week    1 Cans of beer per week    Review of Systems Constitutional: No fever/chills Eyes: No visual changes. ENT: No sore throat. Cardiovascular: Denies chest pain. Respiratory: Denies shortness of breath. Gastrointestinal: No abdominal pain.  No nausea, no vomiting.  No diarrhea.  No constipation. Genitourinary: Negative for dysuria. Musculoskeletal: Right foot pain Skin: Negative for rash. Neurological: Negative for headaches, focal weakness or numbness. Endocrine:Hypertension 10-point ROS otherwise  negative.  ____________________________________________   PHYSICAL EXAM:  VITAL SIGNS: ED Triage Vitals  Enc Vitals Group     BP 11/03/14 1915 165/106 mmHg     Pulse Rate 11/03/14 1915 93     Resp 11/03/14 1915 18     Temp 11/03/14 1915 98 F (36.7 C)     Temp Source 11/03/14 1915 Oral     SpO2 11/03/14 1915 99 %     Weight 11/03/14 1915 235 lb (106.595 kg)     Height 11/03/14 1915 5\' 6"  (1.676 m)     Head Cir --      Peak Flow --      Pain Score 11/03/14 1916 7     Pain Loc --      Pain Edu? --      Excl. in [place]? --     Constitutional: Alert and oriented. Well appearing and in no acute distress. Eyes: Conjunctivae are normal. PERRL. EOMI. Head: Atraumatic. Nose: No congestion/rhinnorhea. Mouth/Throat: Mucous membranes are moist.  Oropharynx non-erythematous. Neck: No stridor. No deformity nontender palpation free nuchal range of motion. Hematological/Lymphatic/Immunilogical: No cervical lymphadenopathy. Cardiovascular: Normal rate, regular rhythm. Grossly normal heart sounds.  Good peripheral circulation. Elevated blood pressure Respiratory: Normal respiratory effort.  No retractions. Lungs CTAB. Gastrointestinal: Soft and nontender. No distention. No abdominal bruits. No CVA tenderness. Musculoskeletal: No deformity of the right foot neurovascularly intact free nuchal rigidity range of motion. Patient is tender palpation dorsal and lateral aspect of the right foot.  Neurologic:  Normal speech and language. No gross focal neurologic deficits are appreciated. Speech is normal. Atypical gait favoring the right foot. Skin:  Skin is warm, dry and intact. No rash noted. Psychiatric: Mood and affect are normal. Speech and behavior are normal.  ____________________________________________   LABS (all labs ordered are listed, but only abnormal results are displayed)  Labs Reviewed - No data to  display ____________________________________________  EKG   ____________________________________________  RADIOLOGY  No acute findings ____________________________________________   PROCEDURES  Procedure(s) performed: None  Critical Care performed: No  ____________________________________________   INITIAL IMPRESSION / ASSESSMENT AND PLAN / ED COURSE  Pertinent labs & imaging results that were available during my care of the patient were reviewed by me and considered in my medical decision making (see chart for details).  Foot contusion ____________________________________________   FINAL CLINICAL IMPRESSION(S) / ED DIAGNOSES  Final diagnoses:  Foot contusion, right, initial encounter      Sable Feil, PA-C 11/03/14 2021  Wandra Arthurs, MD 11/03/14 2222

## 2015-02-21 DIAGNOSIS — F419 Anxiety disorder, unspecified: Secondary | ICD-10-CM | POA: Insufficient documentation

## 2015-02-21 DIAGNOSIS — I10 Essential (primary) hypertension: Secondary | ICD-10-CM | POA: Insufficient documentation

## 2015-05-09 ENCOUNTER — Encounter: Payer: Self-pay | Admitting: Medical Oncology

## 2015-05-09 ENCOUNTER — Emergency Department: Payer: 59

## 2015-05-09 ENCOUNTER — Inpatient Hospital Stay
Admission: EM | Admit: 2015-05-09 | Discharge: 2015-05-12 | DRG: 392 | Disposition: A | Discharge status: Home / Self Care | Payer: 59 | Attending: Surgery | Admitting: Surgery

## 2015-05-09 DIAGNOSIS — I10 Essential (primary) hypertension: Secondary | ICD-10-CM | POA: Diagnosis present

## 2015-05-09 DIAGNOSIS — K5720 Diverticulitis of large intestine with perforation and abscess without bleeding: Secondary | ICD-10-CM | POA: Diagnosis not present

## 2015-05-09 DIAGNOSIS — Z79899 Other long term (current) drug therapy: Secondary | ICD-10-CM | POA: Diagnosis not present

## 2015-05-09 DIAGNOSIS — Z87891 Personal history of nicotine dependence: Secondary | ICD-10-CM

## 2015-05-09 DIAGNOSIS — K5721 Diverticulitis of large intestine with perforation and abscess with bleeding: Secondary | ICD-10-CM | POA: Diagnosis not present

## 2015-05-09 LAB — COMPREHENSIVE METABOLIC PANEL
ALK PHOS: 54 U/L (ref 38–126)
ALT: 18 U/L (ref 17–63)
AST: 19 U/L (ref 15–41)
Albumin: 3.6 g/dL (ref 3.5–5.0)
Anion gap: 8 (ref 5–15)
BUN: 17 mg/dL (ref 6–20)
CHLORIDE: 98 mmol/L — AB (ref 101–111)
CO2: 27 mmol/L (ref 22–32)
CREATININE: 1.06 mg/dL (ref 0.61–1.24)
Calcium: 8.6 mg/dL — ABNORMAL LOW (ref 8.9–10.3)
GFR calc non Af Amer: >60 mL/min (ref >60)
Glucose, Bld: 159 mg/dL — ABNORMAL HIGH (ref 65–99)
Potassium: 2.9 mmol/L — CL (ref 3.5–5.1)
Sodium: 133 mmol/L — ABNORMAL LOW (ref 135–145)
Total Bilirubin: 1.2 mg/dL (ref 0.3–1.2)
Total Protein: 7.2 g/dL (ref 6.5–8.1)

## 2015-05-09 LAB — URINALYSIS COMPLETE WITH MICROSCOPIC (ARMC ONLY)
BILIRUBIN URINE: NEGATIVE
Bacteria, UA: NONE SEEN
GLUCOSE, UA: NEGATIVE mg/dL
Hgb urine dipstick: NEGATIVE
KETONES UR: NEGATIVE mg/dL
Leukocytes, UA: NEGATIVE
Nitrite: NEGATIVE
Protein, ur: NEGATIVE mg/dL
Specific Gravity, Urine: 1.025 (ref 1.005–1.030)
Squamous Epithelial / LPF: NONE SEEN
pH: 6 (ref 5.0–8.0)

## 2015-05-09 LAB — CBC
HCT: 43.3 % (ref 40.0–52.0)
HEMOGLOBIN: 13.9 g/dL (ref 13.0–18.0)
MCH: 28.4 pg (ref 26.0–34.0)
MCHC: 32.2 g/dL (ref 32.0–36.0)
MCV: 88.3 fL (ref 80.0–100.0)
PLATELETS: 277 10*3/uL (ref 150–440)
RBC: 4.91 MIL/uL (ref 4.40–5.90)
RDW: 14.6 % — ABNORMAL HIGH (ref 11.5–14.5)
WBC: 11.1 10*3/uL — ABNORMAL HIGH (ref 3.8–10.6)

## 2015-05-09 LAB — LIPASE, BLOOD: LIPASE: 24 U/L (ref 11–51)

## 2015-05-09 MED ORDER — ONDANSETRON HCL 4 MG/2ML IJ SOLN: 4.0000 mg | Freq: Four times a day (QID) | INTRAMUSCULAR | Status: DC | PRN

## 2015-05-09 MED ORDER — MORPHINE SULFATE (PF) 4 MG/ML IV SOLN
4.0000 mg | Freq: Once | INTRAVENOUS | Status: AC
  Administered 2015-05-09: 4 mg via INTRAVENOUS
  Filled 2015-05-09: qty 1

## 2015-05-09 MED ORDER — MORPHINE SULFATE (PF) 4 MG/ML IV SOLN: 4.0000 mg | INTRAVENOUS | Status: DC | PRN

## 2015-05-09 MED ORDER — IOHEXOL 350 MG/ML SOLN
100.0000 mL | Freq: Once | INTRAVENOUS | Status: AC | PRN
  Administered 2015-05-09: 100 mL via INTRAVENOUS

## 2015-05-09 MED ORDER — POTASSIUM CHLORIDE CRYS ER 20 MEQ PO TBCR
40.0000 meq | EXTENDED_RELEASE_TABLET | Freq: Once | ORAL | Status: AC
  Administered 2015-05-09: 40 meq via ORAL

## 2015-05-09 MED ORDER — PIPERACILLIN-TAZOBACTAM 3.375 G IVPB 30 MIN: 3.3750 g | Freq: Once | INTRAVENOUS | Status: DC

## 2015-05-09 MED ORDER — AMLODIPINE BESYLATE 5 MG PO TABS
5.0000 mg | ORAL_TABLET | Freq: Every day | ORAL | Status: DC
  Administered 2015-05-10 – 2015-05-12 (×3): 5 mg via ORAL
  Filled 2015-05-09 (×3): qty 1

## 2015-05-09 MED ORDER — DEXTROSE-NACL 5-0.9 % IV SOLN
INTRAVENOUS | Status: DC
  Administered 2015-05-09 – 2015-05-10 (×3): via INTRAVENOUS
  Administered 2015-05-11: 1000 mL via INTRAVENOUS
  Administered 2015-05-11 – 2015-05-12 (×3): via INTRAVENOUS

## 2015-05-09 MED ORDER — LOSARTAN POTASSIUM-HCTZ 100-25 MG PO TABS: 1.0000 | ORAL_TABLET | Freq: Every day | ORAL | Status: DC

## 2015-05-09 MED ORDER — HEPARIN SODIUM (PORCINE) 5000 UNIT/ML IJ SOLN
5000.0000 [IU] | Freq: Three times a day (TID) | INTRAMUSCULAR | Status: DC
  Administered 2015-05-09 – 2015-05-12 (×8): 5000 [IU] via SUBCUTANEOUS
  Filled 2015-05-09 (×8): qty 1

## 2015-05-09 MED ORDER — LOSARTAN POTASSIUM 50 MG PO TABS
100.0000 mg | ORAL_TABLET | Freq: Every day | ORAL | Status: DC
  Administered 2015-05-10 – 2015-05-12 (×3): 100 mg via ORAL
  Filled 2015-05-09 (×3): qty 2

## 2015-05-09 MED ORDER — ONDANSETRON HCL 4 MG PO TABS: 4.0000 mg | ORAL_TABLET | Freq: Four times a day (QID) | ORAL | Status: DC | PRN

## 2015-05-09 MED ORDER — LORAZEPAM 0.5 MG PO TABS
0.5000 mg | ORAL_TABLET | Freq: Every day | ORAL | Status: DC | PRN
  Administered 2015-05-09 – 2015-05-11 (×3): 0.5 mg via ORAL
  Filled 2015-05-09 (×4): qty 1

## 2015-05-09 MED ORDER — HYDROCHLOROTHIAZIDE 25 MG PO TABS
25.0000 mg | ORAL_TABLET | Freq: Every day | ORAL | Status: DC
  Administered 2015-05-10 – 2015-05-12 (×3): 25 mg via ORAL
  Filled 2015-05-09 (×3): qty 1

## 2015-05-09 MED ORDER — ONDANSETRON HCL 4 MG/2ML IJ SOLN
4.0000 mg | Freq: Once | INTRAMUSCULAR | Status: AC
  Administered 2015-05-09: 4 mg via INTRAVENOUS
  Filled 2015-05-09: qty 2

## 2015-05-09 MED ORDER — POTASSIUM CHLORIDE CRYS ER 20 MEQ PO TBCR
EXTENDED_RELEASE_TABLET | ORAL | Status: AC
  Administered 2015-05-09: 40 meq via ORAL
  Filled 2015-05-09: qty 2

## 2015-05-09 MED ORDER — BUPROPION HCL ER (XL) 150 MG PO TB24
150.0000 mg | ORAL_TABLET | Freq: Every day | ORAL | Status: DC
  Administered 2015-05-10 – 2015-05-12 (×3): 150 mg via ORAL
  Filled 2015-05-09 (×3): qty 1

## 2015-05-09 MED ORDER — PIPERACILLIN-TAZOBACTAM 3.375 G IVPB
3.3750 g | Freq: Three times a day (TID) | INTRAVENOUS | Status: DC
  Administered 2015-05-09 – 2015-05-12 (×8): 3.375 g via INTRAVENOUS
  Filled 2015-05-09 (×10): qty 50

## 2015-05-09 MED ORDER — OXYCODONE HCL 5 MG PO TABS
5.0000 mg | ORAL_TABLET | ORAL | Status: DC | PRN
  Administered 2015-05-09 – 2015-05-11 (×2): 5 mg via ORAL
  Filled 2015-05-09 (×2): qty 1

## 2015-05-09 MED ORDER — PIPERACILLIN-TAZOBACTAM 3.375 G IVPB
3.3750 g | Freq: Once | INTRAVENOUS | Status: AC
  Administered 2015-05-09: 3.375 g via INTRAVENOUS
  Filled 2015-05-09: qty 50

## 2015-05-09 MED ORDER — IOHEXOL 240 MG/ML SOLN
25.0000 mL | Freq: Once | INTRAMUSCULAR | Status: AC | PRN
  Administered 2015-05-09: 25 mL via ORAL

## 2015-05-09 MED ORDER — VALACYCLOVIR HCL 500 MG PO TABS
500.0000 mg | ORAL_TABLET | Freq: Every day | ORAL | Status: DC
  Administered 2015-05-10 – 2015-05-12 (×3): 500 mg via ORAL
  Filled 2015-05-09 (×3): qty 1

## 2015-05-09 NOTE — ED Notes (Signed)
Pt arrives with complaint of lower abd pain, states he saw his PCP yesterday and was told he had a high WBC and to come to the ER if pain persisted, MD at bedside, tendernes to lower abd on both sides, pt awake and alert, denies any nausea or vomiting

## 2015-05-09 NOTE — ED Notes (Signed)
Dr. Corky Downs notified of critical k-dur of 2.9

## 2015-05-09 NOTE — ED Notes (Signed)
Surgeon at bedside.  

## 2015-05-09 NOTE — ED Notes (Signed)
Patient transported to CT 

## 2015-05-09 NOTE — ED Notes (Signed)
Pt reports 2 nights ago he began having lower abd pain, went to pcp yesterday bc he was also having chills with the pain, blood work was done and pt was told that his WBC was elevated and if the pain continued to come to er. Reports nausea. No vomiting/diarrhea.

## 2015-05-09 NOTE — H&P (Signed)
Neil Mcintyre is an 39 y.o. male.    Chief Complaint: Lower abdominal pain  HPI: This a patient with a history of recurrent and episodic abdominal pain with a diagnosis of acute diverticulitis. In the past he required hospitalization with embolization for bleeding and symptomatic anemia. He's had no bleeding this time but has ongoing left lower and right lower quadrant and suprapubic pain with nausea but no emesis he's had no melena or hematochezia. He denies fevers or chills. He saw his physician yesterday where labs were drawn and he was notified today to come to the emergency room because his white blood cell count was elevated. He has been off and on antibiotics in the past sometimes outpatient and once admitted to the hospital. He denies dysuria.  Past Medical History  Diagnosis Date  . Hypertension     Past Surgical History  Procedure Laterality Date  . Colonoscopy w/ polypectomy      No family history on file. Social History:  reports that he has quit smoking. He does not have any smokeless tobacco history on file. He reports that he drinks about 0.6 oz of alcohol per week. He reports that he does not use illicit drugs.  Allergies: No Known Allergies   (Not in a hospital admission)   Review of Systems  Constitutional: Negative for fever, chills and diaphoresis.  HENT: Negative.   Eyes: Negative.   Respiratory: Negative.   Cardiovascular: Negative.   Gastrointestinal: Positive for abdominal pain. Negative for heartburn, nausea, vomiting, diarrhea, constipation, blood in stool and melena.  Genitourinary: Negative.   Musculoskeletal: Negative.   Skin: Negative.   Neurological: Negative.   Endo/Heme/Allergies: Negative.   Psychiatric/Behavioral: Negative.      Physical Exam:  BP 127/74 mmHg  Pulse 101  Temp(Src) 98 F (36.7 C) (Oral)  Resp 19  Ht '5\' 6"'  (1.676 m)  Wt 243 lb (110.224 kg)  BMI 39.24 kg/m2  SpO2 97%  Physical Exam  Constitutional: He is  oriented to person, place, and time and well-developed, well-nourished, and in no distress. No distress.  HENT:  Head: Normocephalic and atraumatic.  Eyes: Pupils are equal, round, and reactive to light. Right eye exhibits no discharge. Left eye exhibits no discharge. No scleral icterus.  Neck: Normal range of motion. Neck supple.  Cardiovascular: Normal rate, regular rhythm and normal heart sounds.   Pulmonary/Chest: Effort normal and breath sounds normal. No respiratory distress. He has no wheezes. He has no rales.  Abdominal: Soft. He exhibits no distension. There is tenderness. There is no rebound and no guarding.  Tender maximally left lower quadrant but also in right lower quadrant and suprapubic area to lesser degrees. No peritoneal signs.  Genitourinary: Penis normal.  Musculoskeletal: Normal range of motion. He exhibits no edema.  Neurological: He is alert and oriented to person, place, and time.  Skin: Skin is warm and dry. He is not diaphoretic. No erythema.  Multiple tattoos  Psychiatric: Affect and judgment normal.  Vitals reviewed.       Results for orders placed or performed during the hospital encounter of 05/09/15 (from the past 48 hour(s))  Lipase, blood     Status: None   Collection Time: 05/09/15  7:28 AM  Result Value Ref Range   Lipase 24 11 - 51 U/L  Comprehensive metabolic panel     Status: Abnormal   Collection Time: 05/09/15  7:28 AM  Result Value Ref Range   Sodium 133 (L) 135 - 145 mmol/L  Potassium 2.9 (LL) 3.5 - 5.1 mmol/L    Comment: CRITICAL RESULT CALLED TO, READ BACK BY AND VERIFIED WITH OLIVIA BROOMER AT 4680 05/09/15 DAS    Chloride 98 (L) 101 - 111 mmol/L   CO2 27 22 - 32 mmol/L   Glucose, Bld 159 (H) 65 - 99 mg/dL   BUN 17 6 - 20 mg/dL   Creatinine, Ser 1.06 0.61 - 1.24 mg/dL   Calcium 8.6 (L) 8.9 - 10.3 mg/dL   Total Protein 7.2 6.5 - 8.1 g/dL   Albumin 3.6 3.5 - 5.0 g/dL   AST 19 15 - 41 U/L   ALT 18 17 - 63 U/L   Alkaline  Phosphatase 54 38 - 126 U/L   Total Bilirubin 1.2 0.3 - 1.2 mg/dL   GFR calc non Af Amer >60 >60 mL/min   GFR calc Af Amer >60 >60 mL/min    Comment: (NOTE) The eGFR has been calculated using the CKD EPI equation. This calculation has not been validated in all clinical situations. eGFR's persistently <60 mL/min signify possible Chronic Kidney Disease.    Anion gap 8 5 - 15  CBC     Status: Abnormal   Collection Time: 05/09/15  7:28 AM  Result Value Ref Range   WBC 11.1 (H) 3.8 - 10.6 K/uL   RBC 4.91 4.40 - 5.90 MIL/uL   Hemoglobin 13.9 13.0 - 18.0 g/dL   HCT 43.3 40.0 - 52.0 %   MCV 88.3 80.0 - 100.0 fL   MCH 28.4 26.0 - 34.0 pg   MCHC 32.2 32.0 - 36.0 g/dL   RDW 14.6 (H) 11.5 - 14.5 %   Platelets 277 150 - 440 K/uL   Ct Abdomen Pelvis W Contrast  05/09/2015  CLINICAL DATA:  Lower abdominal pain with chills. Elevated white blood cell count. Nausea. History of diverticulitis. EXAM: CT ABDOMEN AND PELVIS WITH CONTRAST TECHNIQUE: Multidetector CT imaging of the abdomen and pelvis was performed using the standard protocol following bolus administration of intravenous contrast. Oral contrast was also administered. CONTRAST:  123m OMNIPAQUE IOHEXOL 350 MG/ML SOLN COMPARISON:  July 23, 2014 FINDINGS: Lower chest:  Lung bases are clear. Hepatobiliary: Liver is prominent, measuring 20.2 cm in length. No focal liver lesions are identified. Gallbladder wall is not appreciably thickened. There is no biliary duct dilatation. Pancreas: There is no pancreatic mass or inflammation. Spleen: No splenic lesions are identified. Adrenals/Urinary Tract: Adrenals appear normal bilaterally. Kidneys bilaterally show no mass or hydronephrosis on either side. There is no renal or ureteral calculus on either side. The urinary bladder is midline with the wall thickness within normal limits given the degree of bladder distention. Stomach/Bowel: There is extensive inflammatory change in the mid sigmoid colon  traversing across the midline and toward the right pelvis. There is diffuse wall thickening in this area with several irregular diverticula and surrounding mesenteric stranding. There is no frank abscess. There are at least one and possibly two tiny foci of extraluminal air in the right upper pelvis consistent with a mild degree of microperforation in the area of diverticulitis. Elsewhere, there are multiple diverticula in the proximal to mid descending colon without inflammation. No bowel obstruction. No demonstrable portal venous air. Vascular/Lymphatic: Aorta is nonaneurysmal. The major mesenteric vessels appear intact. No vascular lesions are appreciable. There is no appreciable adenopathy in the abdomen or pelvis. Reproductive: Prostate and seminal vesicles are normal in size and contour. Other: Appendix appears normal. There is stranding in the nearby mesenteric, felt to be  due to changes from the nearby diverticulitis as opposed to inflammation of the appendix itself. No abscess is seen in the abdomen or pelvis. There is no demonstrable ascites. Musculoskeletal: There are no blastic or lytic bone lesions. No intramuscular or abdominal wall lesions. IMPRESSION: Diverticulitis in the mid sigmoid colon. This diverticulitis is in the same location as prior diverticulitis but is more extensive compared to prior study. There is minimal evidence of microperforation in the right upper pelvis. No frank abscess appreciable. There is stranding near the appendix, but the appendix is felt to be normal; the stranding is felt to be due to the nearby diverticulitis which extends well to the right of midline. No bowel obstruction. No abscess elsewhere in the abdomen or pelvis. No renal or ureteral calculus.  No hydronephrosis. Prominent liver without focal lesion. Critical Value/emergent results were called by telephone at the time of interpretation on 05/09/2015 at 9:16 am to Dr. Lavonia Drafts , who verbally acknowledged  these results. Electronically Signed   By: Lowella Grip III M.D.   On: 05/09/2015 09:16     Assessment/Plan  CT scan is personally reviewed. Prior history was reviewed. Has recurrent diverticulitis with microperforation and requires hospitalization and IV antibiotics at this point. I discussed this with the emergency room physician as well as the patient and his family they were understanding and in agreement with this plan.  Florene Glen, MD, FACS

## 2015-05-09 NOTE — ED Provider Notes (Signed)
[facility] Emergency Department Provider Note  ____________________________________________  Time seen: On arrival  I have reviewed the triage vital signs and the nursing notes.   HISTORY  Chief Complaint Abdominal Pain and Abnormal Lab    HPI Neil Mcintyre is a 39 y.o. male who presents with lower abdominal pain for approximately 2-3 days. Saw his primary care provider yesterday who drew labs and started him on antibiotics. Does have a history of diverticulitis in the past which required hospitalization. He denies fevers chills. He reports his pain has been constant and is moderate to severe and crampy in the lower abdomen. He has been taking ciprofloxacin as prescribed. PCP called him and told him that he had an elevated white blood cell count as well     Past Medical History  Diagnosis Date  . Hypertension     There are no active problems to display for this patient.   Past Surgical History  Procedure Laterality Date  . Colonoscopy w/ polypectomy      Current Outpatient Rx  Name  Route  Sig  Dispense  Refill  . ibuprofen (ADVIL,MOTRIN) 800 MG tablet   Oral   Take 1 tablet (800 mg total) by mouth every 8 (eight) hours as needed for moderate pain.   15 tablet   0   . traMADol (ULTRAM) 50 MG tablet   Oral   Take 1 tablet (50 mg total) by mouth every 6 (six) hours as needed for moderate pain.   12 tablet   0     Allergies Review of patient's allergies indicates no known allergies.  No family history on file.  Social History Social History  Substance Use Topics  . Smoking status: Former Research scientist (life sciences)  . Smokeless tobacco: None  . Alcohol Use: 0.6 oz/week    1 Cans of beer per week    Review of Systems  Constitutional: Negative for fever. Eyes: Negative for visual changes. ENT: Negative for sore throat Cardiovascular: Negative for chest pain. Respiratory: Negative for shortness of breath. Gastrointestinal: No  vomiting Genitourinary: Negative for dysuria. Musculoskeletal: Negative for back pain. Skin: Negative for rash. Neurological: Negative for headaches Psychiatric: Mild anxiety    ____________________________________________   PHYSICAL EXAM:  VITAL SIGNS: ED Triage Vitals  Enc Vitals Group     BP 05/09/15 0713 147/69 mmHg     Pulse Rate 05/09/15 0713 122     Resp 05/09/15 0713 20     Temp 05/09/15 0713 98 F (36.7 C)     Temp Source 05/09/15 0713 Oral     SpO2 05/09/15 0713 96 %     Weight 05/09/15 0713 243 lb (110.224 kg)     Height 05/09/15 0713 5\' 6"  (1.676 m)     Head Cir --      Peak Flow --      Pain Score 05/09/15 0714 7     Pain Loc --      Pain Edu? --      Excl. in [place]? --      Constitutional: Alert and oriented. Well appearing and in no distress. Eyes: Conjunctivae are normal.  ENT   Head: Normocephalic and atraumatic.   Mouth/Throat: Mucous membranes are moist. Cardiovascular: Normal rate, regular rhythm. Normal and symmetric distal pulses are present in all extremities. No murmurs, rubs, or gallops. Respiratory: Normal respiratory effort without tachypnea nor retractions. Breath sounds are clear and equal bilaterally.  Gastrointestinal: Mild to moderate tenderness to palpation left lower quadrant and right  lower quadrant.. No distention. There is no CVA tenderness. Stool is brown but guaiac positive Genitourinary: deferred Musculoskeletal: Nontender with normal range of motion in all extremities. No lower extremity tenderness nor edema. Neurologic:  Normal speech and language. No gross focal neurologic deficits are appreciated. Skin:  Skin is warm, dry and intact. No rash noted. Psychiatric: Mood and affect are normal. Patient exhibits appropriate insight and judgment.  ____________________________________________    LABS (pertinent positives/negatives)  Labs Reviewed  LIPASE, BLOOD  COMPREHENSIVE METABOLIC PANEL  CBC  URINALYSIS COMPLETEWITH  MICROSCOPIC ([place])    ____________________________________________   EKG  None  ____________________________________________    RADIOLOGY I have personally reviewed any xrays that were ordered on this patient: Called by radiologist Dr. Jasmine December and notified of extensive diverticulitis with microperforation  ____________________________________________   PROCEDURES  Procedure(s) performed: none  Critical Care performed: yes  CRITICAL CARE Performed by: Lavonia Drafts   Total critical care time:30 minutes  Critical care time was exclusive of separately billable procedures and treating other patients.  Critical care was necessary to treat or prevent imminent or life-threatening deterioration.  Critical care was time spent personally by me on the following activities: development of treatment plan with patient and/or surrogate as well as nursing, discussions with consultants, evaluation of patient's response to treatment, examination of patient, obtaining history from patient or surrogate, ordering and performing treatments and interventions, ordering and review of laboratory studies, ordering and review of radiographic studies, pulse oximetry and re-evaluation of patient's condition.   ____________________________________________   INITIAL IMPRESSION / ASSESSMENT AND PLAN / ED COURSE  Pertinent labs & imaging results that were available during my care of the patient were reviewed by me and considered in my medical decision making (see chart for details).  Patient presents with tachycardia and reported outpatient elevated white blood cell count. He is tender in the lower abdomen possibly right greater than left. Differential includes appendicitis, diverticulitis, UTI nonspecific abdominal pain. We will give fluids, IV morphine and Zofran and obtain CT abdomen pelvis and reevaluate  Patient does have a history of diverticulosis that was severe with some extensive  bleeding that required embolization by vascular surgery but he denies bloody stools today and his hemoglobin is normal.  ----------------------------------------- 9:24 AM on 05/09/2015 -----------------------------------------  Called by radiologist to be notified of microperforation from diverticulitis. Zosyn IV ordered and I will contact general surgery  ----------------------------------------- 9:28 AM on 05/09/2015 -----------------------------------------  Discussed with Dr. Burt Knack who will admit the patient ____________________________________________   FINAL CLINICAL IMPRESSION(S) / ED DIAGNOSES  Final diagnoses:  Diverticulitis of large intestine with perforation with bleeding     Lavonia Drafts, MD 05/09/15 [phone]

## 2015-05-09 NOTE — ED Notes (Signed)
Attempted to call report to floor and they stated "can you given [place] a few more minutes, the bed has only been approved for 1 minute and then nurse is in a room", will call back in 5 minutes

## 2015-05-10 DIAGNOSIS — K5721 Diverticulitis of large intestine with perforation and abscess with bleeding: Secondary | ICD-10-CM

## 2015-05-10 LAB — BASIC METABOLIC PANEL
Anion gap: 6 (ref 5–15)
BUN: 15 mg/dL (ref 6–20)
CALCIUM: 8.3 mg/dL — AB (ref 8.9–10.3)
CO2: 29 mmol/L (ref 22–32)
CREATININE: 1.03 mg/dL (ref 0.61–1.24)
Chloride: 101 mmol/L (ref 101–111)
GFR calc Af Amer: >60 mL/min (ref >60)
GFR calc non Af Amer: >60 mL/min (ref >60)
GLUCOSE: 115 mg/dL — AB (ref 65–99)
POTASSIUM: 3 mmol/L — AB (ref 3.5–5.1)
SODIUM: 136 mmol/L (ref 135–145)

## 2015-05-10 LAB — CBC
HCT: 40.6 % (ref 40.0–52.0)
Hemoglobin: 13.4 g/dL (ref 13.0–18.0)
MCH: 29.6 pg (ref 26.0–34.0)
MCHC: 33.1 g/dL (ref 32.0–36.0)
MCV: 89.3 fL (ref 80.0–100.0)
PLATELETS: 245 10*3/uL (ref 150–440)
RBC: 4.54 MIL/uL (ref 4.40–5.90)
RDW: 14.9 % — ABNORMAL HIGH (ref 11.5–14.5)
WBC: 8 10*3/uL (ref 3.8–10.6)

## 2015-05-10 MED ORDER — POTASSIUM CHLORIDE CRYS ER 20 MEQ PO TBCR
20.0000 meq | EXTENDED_RELEASE_TABLET | Freq: Two times a day (BID) | ORAL | Status: DC
  Administered 2015-05-10 – 2015-05-12 (×4): 20 meq via ORAL
  Filled 2015-05-10 (×4): qty 1

## 2015-05-10 NOTE — Progress Notes (Signed)
CC:  Left  lower quadrant pain Subjective: She was admitted with left lower quadrant pain and a diagnosis of recurrent diverticulitis. He feels better today on IV antibiotics has no nausea or vomiting and less pain. No fevers or chills.  Objective: Vital signs in last 24 hours: Temp:  [97.5 F (36.4 C)-98.7 F (37.1 C)] 97.5 F (36.4 C) (12/07 0426) Pulse Rate:  [78-101] 78 (12/07 0426) Resp:  [18-19] 18 (12/07 0426) BP: (118-141)/(66-84) 129/66 mmHg (12/07 0426) SpO2:  [97 %-99 %] 98 % (12/07 0426) Weight:  [239 lb 6.4 oz (108.591 kg)] 239 lb 6.4 oz (108.591 kg) (12/06 1231) Last BM Date: 05/09/15  Intake/Output from previous day: 12/06 0701 - 12/07 0700 In: 2591 [P.O.:1580; I.V.:1011] Out: 900 [Urine:900] Intake/Output this shift: Total I/O In: 1706 [P.O.:360; I.V.:1278; IV Piggyback:68] Out: 0   Physical exam:  Much more comfortable appearing.  Abdomen is soft and minimally tender in the left lower quadrant without peritoneal signs nontender calves  Lab Results: CBC   Recent Labs  05/09/15 0728 05/10/15 0607  WBC 11.1* 8.0  HGB 13.9 13.4  HCT 43.3 40.6  PLT 277 245   BMET  Recent Labs  05/09/15 0728 05/10/15 0607  NA 133* 136  K 2.9* 3.0*  CL 98* 101  CO2 27 29  GLUCOSE 159* 115*  BUN 17 15  CREATININE 1.06 1.03  CALCIUM 8.6* 8.3*   PT/INR No results for input(s): LABPROT, INR in the last 72 hours. ABG No results for input(s): PHART, HCO3 in the last 72 hours.  Invalid input(s): PCO2, PO2  Studies/Results: Ct Abdomen Pelvis W Contrast  05/09/2015  CLINICAL DATA:  Lower abdominal pain with chills. Elevated white blood cell count. Nausea. History of diverticulitis. EXAM: CT ABDOMEN AND PELVIS WITH CONTRAST TECHNIQUE: Multidetector CT imaging of the abdomen and pelvis was performed using the standard protocol following bolus administration of intravenous contrast. Oral contrast was also administered. CONTRAST:  127mL OMNIPAQUE IOHEXOL 350 MG/ML SOLN  COMPARISON:  July 23, 2014 FINDINGS: Lower chest:  Lung bases are clear. Hepatobiliary: Liver is prominent, measuring 20.2 cm in length. No focal liver lesions are identified. Gallbladder wall is not appreciably thickened. There is no biliary duct dilatation. Pancreas: There is no pancreatic mass or inflammation. Spleen: No splenic lesions are identified. Adrenals/Urinary Tract: Adrenals appear normal bilaterally. Kidneys bilaterally show no mass or hydronephrosis on either side. There is no renal or ureteral calculus on either side. The urinary bladder is midline with the wall thickness within normal limits given the degree of bladder distention. Stomach/Bowel: There is extensive inflammatory change in the mid sigmoid colon traversing across the midline and toward the right pelvis. There is diffuse wall thickening in this area with several irregular diverticula and surrounding mesenteric stranding. There is no frank abscess. There are at least one and possibly two tiny foci of extraluminal air in the right upper pelvis consistent with a mild degree of microperforation in the area of diverticulitis. Elsewhere, there are multiple diverticula in the proximal to mid descending colon without inflammation. No bowel obstruction. No demonstrable portal venous air. Vascular/Lymphatic: Aorta is nonaneurysmal. The major mesenteric vessels appear intact. No vascular lesions are appreciable. There is no appreciable adenopathy in the abdomen or pelvis. Reproductive: Prostate and seminal vesicles are normal in size and contour. Other: Appendix appears normal. There is stranding in the nearby mesenteric, felt to be due to changes from the nearby diverticulitis as opposed to inflammation of the appendix itself. No abscess is seen  in the abdomen or pelvis. There is no demonstrable ascites. Musculoskeletal: There are no blastic or lytic bone lesions. No intramuscular or abdominal wall lesions. IMPRESSION: Diverticulitis in the  mid sigmoid colon. This diverticulitis is in the same location as prior diverticulitis but is more extensive compared to prior study. There is minimal evidence of microperforation in the right upper pelvis. No frank abscess appreciable. There is stranding near the appendix, but the appendix is felt to be normal; the stranding is felt to be due to the nearby diverticulitis which extends well to the right of midline. No bowel obstruction. No abscess elsewhere in the abdomen or pelvis. No renal or ureteral calculus.  No hydronephrosis. Prominent liver without focal lesion. Critical Value/emergent results were called by telephone at the time of interpretation on 05/09/2015 at 9:16 am to Dr. Lavonia Drafts , who verbally acknowledged these results. Electronically Signed   By: Lowella Grip III M.D.   On: 05/09/2015 09:16    Anti-infectives: Anti-infectives    Start     Dose/Rate Route Frequency Ordered Stop   05/09/15 2200  piperacillin-tazobactam (ZOSYN) IVPB 3.375 g     3.375 g 12.5 mL/hr over 240 Minutes Intravenous 3 times per day 05/09/15 1145     05/09/15 1200  piperacillin-tazobactam (ZOSYN) IVPB 3.375 g     3.375 g 100 mL/hr over 30 Minutes Intravenous  Once 05/09/15 1134     05/09/15 1145  valACYclovir (VALTREX) tablet 500 mg     500 mg Oral Daily 05/09/15 1135     05/09/15 0930  piperacillin-tazobactam (ZOSYN) IVPB 3.375 g     3.375 g 12.5 mL/hr over 240 Minutes Intravenous  Once 05/09/15 0917 05/09/15 1018      Assessment/Plan:  Overall improving on IV antibiotics this is a recurrent episode and he will require colon resection at some point.  Plan would be to continue IV antibiotics to avoid a colostomy via emergent surgery this is discussed with he and his wife and nursing. He is tolerating a clear liquid diet.  Florene Glen, MD, FACS  05/10/2015

## 2015-05-11 LAB — CBC WITH DIFFERENTIAL/PLATELET
BASOS ABS: 0 10*3/uL (ref 0–0.1)
BASOS PCT: 1 %
EOS PCT: 4 %
Eosinophils Absolute: 0.3 10*3/uL (ref 0–0.7)
HCT: 42.4 % (ref 40.0–52.0)
Hemoglobin: 14.3 g/dL (ref 13.0–18.0)
Lymphocytes Relative: 18 %
Lymphs Abs: 1.2 10*3/uL (ref 1.0–3.6)
MCH: 29.9 pg (ref 26.0–34.0)
MCHC: 33.7 g/dL (ref 32.0–36.0)
MCV: 88.8 fL (ref 80.0–100.0)
MONO ABS: 1.4 10*3/uL — AB (ref 0.2–1.0)
Monocytes Relative: 21 %
Neutro Abs: 3.8 10*3/uL (ref 1.4–6.5)
Neutrophils Relative %: 56 %
PLATELETS: 280 10*3/uL (ref 150–440)
RBC: 4.77 MIL/uL (ref 4.40–5.90)
RDW: 14.5 % (ref 11.5–14.5)
WBC: 6.7 10*3/uL (ref 3.8–10.6)

## 2015-05-11 LAB — BASIC METABOLIC PANEL
ANION GAP: 5 (ref 5–15)
BUN: 8 mg/dL (ref 6–20)
CALCIUM: 8.7 mg/dL — AB (ref 8.9–10.3)
CO2: 28 mmol/L (ref 22–32)
Chloride: 105 mmol/L (ref 101–111)
Creatinine, Ser: 0.76 mg/dL (ref 0.61–1.24)
GLUCOSE: 99 mg/dL (ref 65–99)
Potassium: 3.3 mmol/L — ABNORMAL LOW (ref 3.5–5.1)
SODIUM: 138 mmol/L (ref 135–145)

## 2015-05-11 NOTE — Progress Notes (Signed)
CC: Left lower quadrant pain Subjective: Patient admitted and being treated for acute diverticulitis he is slightly improved today. He is tolerating a clear liquid diet and would like to advance. Having bowel movements.  Objective: Vital signs in last 24 hours: Temp:  [97.3 F (36.3 C)-98.3 F (36.8 C)] 98.1 F (36.7 C) (12/08 1209) Pulse Rate:  [72-91] 90 (12/08 1209) Resp:  [16-20] 16 (12/08 0456) BP: (107-137)/(51-78) 135/78 mmHg (12/08 1209) SpO2:  [95 %-100 %] 95 % (12/08 1209) Last BM Date: 05/10/15  Intake/Output from previous day: 12/07 0701 - 12/08 0700 In: 6050.1 [P.O.:1840; I.V.:4111.1; IV Piggyback:99] Out: 2876 [Urine:2875; Stool:1] Intake/Output this shift: Total I/O In: 240 [P.O.:240] Out: -   Physical exam:  Comfortable alert oriented soft nontender abdomen with the exception of some minimal tenderness in the left lower quadrant with no peritoneal signs overall improved. Nontender calves  Lab Results: CBC   Recent Labs  05/10/15 0607 05/11/15 0512  WBC 8.0 6.7  HGB 13.4 14.3  HCT 40.6 42.4  PLT 245 280   BMET  Recent Labs  05/10/15 0607 05/11/15 0512  NA 136 138  K 3.0* 3.3*  CL 101 105  CO2 29 28  GLUCOSE 115* 99  BUN 15 8  CREATININE 1.03 0.76  CALCIUM 8.3* 8.7*   PT/INR No results for input(s): LABPROT, INR in the last 72 hours. ABG No results for input(s): PHART, HCO3 in the last 72 hours.  Invalid input(s): PCO2, PO2  Studies/Results: No results found.  Anti-infectives: Anti-infectives    Start     Dose/Rate Route Frequency Ordered Stop   05/09/15 2200  piperacillin-tazobactam (ZOSYN) IVPB 3.375 g     3.375 g 12.5 mL/hr over 240 Minutes Intravenous 3 times per day 05/09/15 1145     05/09/15 1200  piperacillin-tazobactam (ZOSYN) IVPB 3.375 g     3.375 g 100 mL/hr over 30 Minutes Intravenous  Once 05/09/15 1134     05/09/15 1145  valACYclovir (VALTREX) tablet 500 mg     500 mg Oral Daily 05/09/15 1135     05/09/15 0930   piperacillin-tazobactam (ZOSYN) IVPB 3.375 g     3.375 g 12.5 mL/hr over 240 Minutes Intravenous  Once 05/09/15 0917 05/09/15 1018      Assessment/Plan:  Continues to improve albeit slowly. Continue IV antibiotics possibly discharge tomorrow.  Florene Glen, MD, FACS  05/11/2015

## 2015-05-12 ENCOUNTER — Telehealth: Payer: Self-pay | Admitting: Surgery

## 2015-05-12 MED ORDER — OXYCODONE HCL 5 MG PO TABS: 5.0000 mg | ORAL_TABLET | ORAL | Status: DC | PRN

## 2015-05-12 MED ORDER — POTASSIUM CHLORIDE CRYS ER 20 MEQ PO TBCR: 20.0000 meq | EXTENDED_RELEASE_TABLET | Freq: Two times a day (BID) | ORAL | Status: DC

## 2015-05-12 MED ORDER — METRONIDAZOLE 500 MG PO TABS: 500.0000 mg | ORAL_TABLET | Freq: Three times a day (TID) | ORAL | Status: DC

## 2015-05-12 NOTE — Discharge Instructions (Signed)
Resume all home medications Take Cipro and Flagyl as prescribed Resume normal activities Follow-up with Dr. Burt Knack in 10 days

## 2015-05-12 NOTE — Discharge Summary (Signed)
Physician Discharge Summary  Patient ID: Neil Mcintyre MRN: [id number] DOB/AGE: 1976/01/27 39 y.o.  Admit date: 05/09/2015 Discharge date: 05/12/2015   Discharge Diagnoses:  Active Problems:   Diverticulitis large intestine   Diverticulitis of large intestine with perforation with bleeding   Proceduresnone  Hospital Course: This patient with recurrent acute diverticulitis on this episode he had no bleeding however. Have signs of microperforation. While in the hospital on IV antibiotics he improved considerably and is to be switched today to oral antibiotics and will be discharged on a full liquid diet follow-up in my office in 10 days he will resume home medications his job is considerable physical activity and will likely not be able to go to work this week unless he feels much improved.  Consults: none  Disposition: 01-Home or Self Care     Medication List    TAKE these medications        amLODipine 5 MG tablet  Commonly known as:  NORVASC  Take 1 tablet by mouth daily.     buPROPion 150 MG 24 hr tablet  Commonly known as:  WELLBUTRIN XL  Take 1 tablet by mouth daily.     ciprofloxacin 500 MG tablet  Commonly known as:  CIPRO  Take 1 tablet by mouth 2 (two) times daily.     LORazepam 0.5 MG tablet  Commonly known as:  ATIVAN  Take 1 tablet by mouth daily as needed.     losartan-hydrochlorothiazide 100-25 MG tablet  Commonly known as:  HYZAAR  Take 1 tablet by mouth daily.     metroNIDAZOLE 500 MG tablet  Commonly known as:  FLAGYL  Take 1 tablet (500 mg total) by mouth 3 (three) times daily.     oxyCODONE 5 MG immediate release tablet  Commonly known as:  Oxy IR/ROXICODONE  Take 1 tablet (5 mg total) by mouth every 4 (four) hours as needed for moderate pain.     potassium chloride SA 20 MEQ tablet  Commonly known as:  K-DUR,KLOR-CON  Take 1 tablet (20 mEq total) by mouth 2 (two) times daily.     valACYclovir 500 MG tablet  Commonly known as:  VALTREX   Take 1 tablet by mouth daily.           Follow-up Information    Follow up with Phoebe Perch, MD In 10 days.   Specialty:  Surgery   Why:  follow up   Contact information:   [address] [place] [address] [phone]       Florene Glen, MD, FACS

## 2015-05-12 NOTE — Progress Notes (Signed)
05/12/2015 11:21 AM  BP 141/82 mmHg  Pulse 80  Temp(Src) 97.5 F (36.4 C) (Oral)  Resp 18  Ht 5\' 6"  (1.676 m)  Wt 108.591 kg (239 lb 6.4 oz)  BMI 38.66 kg/m2  SpO2 100% Patient discharged per MD orders. Discharge instructions reviewed with patient and patient verbalized understanding. IV's removed per policy. Prescriptions discussed and given to patient. Patient instructed on how to pick up work note from Dr. Antionette Char office. Patient understanding. Discharged via wheelchair escorted by auxilary.  Almedia Balls, RN

## 2015-05-12 NOTE — Telephone Encounter (Signed)
Spoke with patient when he called in and explained to him that he does not have a diet restriction. Pt verbalizes understanding of this at this time. Answered all questions to patient's satisfaction. Will call back with any further problems.

## 2015-05-12 NOTE — Progress Notes (Signed)
Patient A/O, no noted distress. Denies pain. Patient tolerated meds. Administered prn (anxiety) regimen, effective. Staff will continue to monitor and meet needs.

## 2015-05-12 NOTE — Telephone Encounter (Signed)
Patient's work was typed, printed and left at the front desk. Patient was notified.

## 2015-05-12 NOTE — Progress Notes (Signed)
CC: Acute diverticulitis Subjective: Patient feels better today he's tolerating a full liquid diet. Fevers or chills no nausea or vomiting  Objective: Vital signs in last 24 hours: Temp:  [97.5 F (36.4 C)-98.2 F (36.8 C)] 97.5 F (36.4 C) (12/09 [id number]) Pulse Rate:  [79-90] 80 (12/09 0611) Resp:  [16-18] 18 (12/09 0611) BP: (135-147)/(77-82) 141/82 mmHg (12/09 0611) SpO2:  [95 %-100 %] 100 % (12/09 0611) Last BM Date: 05/11/15  Intake/Output from previous day: 12/08 0701 - 12/09 0700 In: 5533.5 [P.O.:840; I.V.:4543.5; IV Piggyback:150] Out: 800 [Urine:800] Intake/Output this shift:    Physical exam:  Abdomen is soft and minimally tender in the left lower quadrant with no peritoneal signs much improved over prior exam. Calves are nontender No icterus no jaundice.  Lab Results: CBC   Recent Labs  05/10/15 0607 05/11/15 0512  WBC 8.0 6.7  HGB 13.4 14.3  HCT 40.6 42.4  PLT 245 280   BMET  Recent Labs  05/10/15 0607 05/11/15 0512  NA 136 138  K 3.0* 3.3*  CL 101 105  CO2 29 28  GLUCOSE 115* 99  BUN 15 8  CREATININE 1.03 0.76  CALCIUM 8.3* 8.7*   PT/INR No results for input(s): LABPROT, INR in the last 72 hours. ABG No results for input(s): PHART, HCO3 in the last 72 hours.  Invalid input(s): PCO2, PO2  Studies/Results: No results found.  Anti-infectives: Anti-infectives    Start     Dose/Rate Route Frequency Ordered Stop   05/12/15 0000  metroNIDAZOLE (FLAGYL) 500 MG tablet     500 mg Oral 3 times daily 05/12/15 0810     05/09/15 2200  piperacillin-tazobactam (ZOSYN) IVPB 3.375 g     3.375 g 12.5 mL/hr over 240 Minutes Intravenous 3 times per day 05/09/15 1145     05/09/15 1200  piperacillin-tazobactam (ZOSYN) IVPB 3.375 g     3.375 g 100 mL/hr over 30 Minutes Intravenous  Once 05/09/15 1134     05/09/15 1145  valACYclovir (VALTREX) tablet 500 mg     500 mg Oral Daily 05/09/15 1135     05/09/15 0930  piperacillin-tazobactam (ZOSYN) IVPB 3.375  g     3.375 g 12.5 mL/hr over 240 Minutes Intravenous  Once 05/09/15 [id number] 05/09/15 1018      Assessment/Plan:   Patient doing very well will change to oral antibiotics and discharged today to follow-up in my office in 10 days he'll be sent home on Cipro and Flagyl he was taking Cipro preoperatively and I will add Flagyl and asked him to stay on until seen in the office in 10 days.  Florene Glen, MD, FACS  05/12/2015

## 2015-05-12 NOTE — Telephone Encounter (Signed)
Patient called and said he was being released from the hospital today and needs a return to work note/restriction. He knows he will pick it up in the [place] office. The hospital told him that we would write this for him. They called [name] earlier.

## 2015-05-24 ENCOUNTER — Encounter: Payer: Self-pay | Admitting: General Surgery

## 2015-05-24 ENCOUNTER — Ambulatory Visit (INDEPENDENT_AMBULATORY_CARE_PROVIDER_SITE_OTHER): Payer: 59 | Admitting: General Surgery

## 2015-05-24 ENCOUNTER — Telehealth: Payer: Self-pay

## 2015-05-24 VITALS — BP 157/86 | HR 100 | Ht 66.0 in | Wt 250.0 lb

## 2015-05-24 DIAGNOSIS — K5721 Diverticulitis of large intestine with perforation and abscess with bleeding: Secondary | ICD-10-CM

## 2015-05-24 MED ORDER — METRONIDAZOLE 500 MG PO TABS: 500.0000 mg | ORAL_TABLET | Freq: Three times a day (TID) | ORAL | Status: DC

## 2015-05-24 MED ORDER — CIPROFLOXACIN HCL 500 MG PO TABS: 500.0000 mg | ORAL_TABLET | Freq: Two times a day (BID) | ORAL | Status: DC

## 2015-05-24 NOTE — Telephone Encounter (Signed)
Patient's Disability Certificate and medical records were faxed to [phone] per patient's request.

## 2015-05-24 NOTE — Progress Notes (Signed)
Outpatient Surgical Follow Up  05/24/2015  CARR MATTINSON is an 39 y.o. male.   Chief Complaint  Patient presents with  . Follow-up    ED/Diverticulitis-Perforated    HPI: 39 year old male returns to clinic for follow-up of recent hospitalization for perforated diverticulitis. Patient continues on home oral antibiotics. He states that he has been pain free. He denies any fevers, chills, nausea, vomiting, diarrhea, constipation, chest pain, shortness of breath. Patient states this is been his fifth or sixth course of diverticulitis and he has petrified that it will reoccur. Of note patient's wife is also very pregnant with expected arrival of her next child the first or second week of February. Patient's previous diverticulitis attacks have been complicated. He's had numerous admissions for antibiotics and on admission earlier this year he required angioembolization of bleeding. Patient has no complaints and is currently and has returned to his usual baseline state of health.  Past Medical History  Diagnosis Date  . Hypertension    multiple bouts of diverticulitis.  Past Surgical History  Procedure Laterality Date  . Colonoscopy w/ polypectomy      Family History  Problem Relation Age of Onset  . Adopted: Yes    Social History:  reports that he quit smoking about 2 years ago. He does not have any smokeless tobacco history on file. He reports that he drinks about 0.6 oz of alcohol per week. He reports that he does not use illicit drugs.  Allergies: No Known Allergies  Medications reviewed.    ROS  A multipoint review of systems was completed, all pertinent positives and negatives are documented within the history of present illness and the remainder were negative.  BP 157/86 mmHg  Pulse 100  Ht 5\' 6"  (1.676 m)  Wt 113.399 kg (250 lb)  BMI 40.37 kg/m2  Physical Exam  Gen.: No acute distress Chest: Clear to auscultation Heart: Tachycardic but without murmur Abdomen:  Large, soft, nontender, nondistended Extremities: Moves all chemise well without any palpable edema.   No results found for this or any previous visit (from the past 48 hour(s)). No results found.  Assessment/Plan:  1. Diverticulitis of large intestine with perforation with bleeding 39 year old male with recurrent diverticulitis. Given the multiple episodes of diverticulitis discussed with patient the indication for an elective sigmoid colectomy. Patient is wife voiced understanding and both desire to proceed with surgery before another attack occurred. Complicating this is that his wife is expecting the birth of her next child at the approximate time that he could be ready for surgery. Discussed that it should be okay for him to wait until March or April of next year for surgery to allow his wife to recover from her planned C-section. We will provide with a paper prescription for Cipro and Flagyl so the patient can immediately restart antibiotics should he feel another flare happening prior to his follow-up. We will see patient back again next month to continue conversation about the planned surgery with anticipation of an elective sigmoid colectomy being performed in March or April 2017.     [name], MD FACS General Surgeon  05/24/2015,11:05 AM

## 2015-05-24 NOTE — Patient Instructions (Signed)
I will need you to stay regular. This includes for you to drink plenty of water, eat plenty of fiber, and take Miralax.  If you have a flare up, I need you to give [place] a call and immediately start taking the antibiotics that were provided for you.

## 2015-05-28 ENCOUNTER — Encounter: Payer: Self-pay | Admitting: Emergency Medicine

## 2015-05-28 ENCOUNTER — Observation Stay
Admission: EM | Admit: 2015-05-28 | Discharge: 2015-05-29 | Disposition: A | Discharge status: Home / Self Care | Payer: 59 | Attending: Internal Medicine | Admitting: Internal Medicine

## 2015-05-28 DIAGNOSIS — I10 Essential (primary) hypertension: Secondary | ICD-10-CM | POA: Diagnosis not present

## 2015-05-28 DIAGNOSIS — K625 Hemorrhage of anus and rectum: Secondary | ICD-10-CM | POA: Diagnosis present

## 2015-05-28 DIAGNOSIS — M7550 Bursitis of unspecified shoulder: Secondary | ICD-10-CM | POA: Diagnosis not present

## 2015-05-28 DIAGNOSIS — K5731 Diverticulosis of large intestine without perforation or abscess with bleeding: Secondary | ICD-10-CM

## 2015-05-28 DIAGNOSIS — Z87898 Personal history of other specified conditions: Secondary | ICD-10-CM | POA: Diagnosis not present

## 2015-05-28 DIAGNOSIS — F419 Anxiety disorder, unspecified: Secondary | ICD-10-CM | POA: Diagnosis present

## 2015-05-28 DIAGNOSIS — Z86010 Personal history of colonic polyps: Secondary | ICD-10-CM | POA: Diagnosis not present

## 2015-05-28 DIAGNOSIS — Z79899 Other long term (current) drug therapy: Secondary | ICD-10-CM | POA: Diagnosis not present

## 2015-05-28 DIAGNOSIS — F329 Major depressive disorder, single episode, unspecified: Secondary | ICD-10-CM | POA: Insufficient documentation

## 2015-05-28 DIAGNOSIS — R1030 Lower abdominal pain, unspecified: Secondary | ICD-10-CM | POA: Insufficient documentation

## 2015-05-28 DIAGNOSIS — K5732 Diverticulitis of large intestine without perforation or abscess without bleeding: Secondary | ICD-10-CM | POA: Diagnosis not present

## 2015-05-28 DIAGNOSIS — Z87891 Personal history of nicotine dependence: Secondary | ICD-10-CM | POA: Diagnosis not present

## 2015-05-28 DIAGNOSIS — K5721 Diverticulitis of large intestine with perforation and abscess with bleeding: Secondary | ICD-10-CM | POA: Diagnosis present

## 2015-05-28 HISTORY — DX: Diverticulitis of intestine, part unspecified, without perforation or abscess without bleeding: K57.92

## 2015-05-28 LAB — CBC
HCT: 47.5 % (ref 40.0–52.0)
Hemoglobin: 15.9 g/dL (ref 13.0–18.0)
MCH: 29.7 pg (ref 26.0–34.0)
MCHC: 33.4 g/dL (ref 32.0–36.0)
MCV: 88.9 fL (ref 80.0–100.0)
PLATELETS: 346 10*3/uL (ref 150–440)
RBC: 5.34 MIL/uL (ref 4.40–5.90)
RDW: 15.8 % — AB (ref 11.5–14.5)
WBC: 12.2 10*3/uL — AB (ref 3.8–10.6)

## 2015-05-28 LAB — URINALYSIS COMPLETE WITH MICROSCOPIC (ARMC ONLY)
Bacteria, UA: NONE SEEN
Bilirubin Urine: NEGATIVE
GLUCOSE, UA: NEGATIVE mg/dL
Hgb urine dipstick: NEGATIVE
KETONES UR: NEGATIVE mg/dL
LEUKOCYTES UA: NEGATIVE
NITRITE: NEGATIVE
PROTEIN: NEGATIVE mg/dL
SPECIFIC GRAVITY, URINE: 1.017 (ref 1.005–1.030)
Squamous Epithelial / LPF: NONE SEEN
pH: 6 (ref 5.0–8.0)

## 2015-05-28 LAB — COMPREHENSIVE METABOLIC PANEL
ALT: 22 U/L (ref 17–63)
AST: 17 U/L (ref 15–41)
Albumin: 4.2 g/dL (ref 3.5–5.0)
Alkaline Phosphatase: 55 U/L (ref 38–126)
Anion gap: 8 (ref 5–15)
BILIRUBIN TOTAL: 0.7 mg/dL (ref 0.3–1.2)
BUN: 17 mg/dL (ref 6–20)
CO2: 26 mmol/L (ref 22–32)
CREATININE: 0.68 mg/dL (ref 0.61–1.24)
Calcium: 9.6 mg/dL (ref 8.9–10.3)
Chloride: 101 mmol/L (ref 101–111)
Glucose, Bld: 104 mg/dL — ABNORMAL HIGH (ref 65–99)
POTASSIUM: 3.9 mmol/L (ref 3.5–5.1)
Sodium: 135 mmol/L (ref 135–145)
TOTAL PROTEIN: 7.8 g/dL (ref 6.5–8.1)

## 2015-05-28 LAB — LIPASE, BLOOD: LIPASE: 31 U/L (ref 11–51)

## 2015-05-28 NOTE — ED Notes (Signed)
pt was admitted on approx the 11th of december and dx with diverticulitis followed up with surgeron was instructed to come to the ER if there were any changes prior to surgery. pt reports bright red blood after a bowel movement on the toilet paper also reports lower abdominal pain

## 2015-05-29 DIAGNOSIS — K5721 Diverticulitis of large intestine with perforation and abscess with bleeding: Secondary | ICD-10-CM

## 2015-05-29 DIAGNOSIS — K625 Hemorrhage of anus and rectum: Secondary | ICD-10-CM | POA: Diagnosis not present

## 2015-05-29 LAB — CBC
HCT: 46.7 % (ref 40.0–52.0)
HEMOGLOBIN: 15.6 g/dL (ref 13.0–18.0)
MCH: 29.5 pg (ref 26.0–34.0)
MCHC: 33.3 g/dL (ref 32.0–36.0)
MCV: 88.6 fL (ref 80.0–100.0)
PLATELETS: 338 10*3/uL (ref 150–440)
RBC: 5.27 MIL/uL (ref 4.40–5.90)
RDW: 15.3 % — ABNORMAL HIGH (ref 11.5–14.5)
WBC: 11.6 10*3/uL — ABNORMAL HIGH (ref 3.8–10.6)

## 2015-05-29 MED ORDER — ACETAMINOPHEN 325 MG PO TABS: 650.0000 mg | ORAL_TABLET | Freq: Four times a day (QID) | ORAL | Status: DC | PRN

## 2015-05-29 MED ORDER — METRONIDAZOLE 500 MG PO TABS
500.0000 mg | ORAL_TABLET | Freq: Three times a day (TID) | ORAL | Status: DC
  Administered 2015-05-29 (×2): 500 mg via ORAL
  Filled 2015-05-29 (×3): qty 1

## 2015-05-29 MED ORDER — ONDANSETRON HCL 4 MG PO TABS: 4.0000 mg | ORAL_TABLET | Freq: Four times a day (QID) | ORAL | Status: DC | PRN

## 2015-05-29 MED ORDER — LOSARTAN POTASSIUM-HCTZ 100-25 MG PO TABS: 1.0000 | ORAL_TABLET | Freq: Every day | ORAL | Status: DC

## 2015-05-29 MED ORDER — AMLODIPINE BESYLATE 5 MG PO TABS
5.0000 mg | ORAL_TABLET | Freq: Every day | ORAL | Status: DC
  Administered 2015-05-29: 5 mg via ORAL
  Filled 2015-05-29: qty 1

## 2015-05-29 MED ORDER — CIPROFLOXACIN HCL 250 MG PO TABS
750.0000 mg | ORAL_TABLET | Freq: Two times a day (BID) | ORAL | Status: DC
  Administered 2015-05-29: 750 mg via ORAL
  Filled 2015-05-29 (×2): qty 1

## 2015-05-29 MED ORDER — LOSARTAN POTASSIUM 50 MG PO TABS
100.0000 mg | ORAL_TABLET | Freq: Every day | ORAL | Status: DC
  Administered 2015-05-29: 100 mg via ORAL
  Filled 2015-05-29: qty 2

## 2015-05-29 MED ORDER — BUPROPION HCL ER (XL) 150 MG PO TB24
150.0000 mg | ORAL_TABLET | Freq: Every day | ORAL | Status: DC
  Administered 2015-05-29: 150 mg via ORAL
  Filled 2015-05-29: qty 1

## 2015-05-29 MED ORDER — HYDROCHLOROTHIAZIDE 25 MG PO TABS
25.0000 mg | ORAL_TABLET | Freq: Every day | ORAL | Status: DC
  Administered 2015-05-29: 25 mg via ORAL
  Filled 2015-05-29: qty 1

## 2015-05-29 MED ORDER — ACETAMINOPHEN 650 MG RE SUPP: 650.0000 mg | Freq: Four times a day (QID) | RECTAL | Status: DC | PRN

## 2015-05-29 MED ORDER — SODIUM CHLORIDE 0.9 % IV BOLUS (SEPSIS)
500.0000 mL | Freq: Once | INTRAVENOUS | Status: AC
  Administered 2015-05-29: 500 mL via INTRAVENOUS

## 2015-05-29 MED ORDER — VALACYCLOVIR HCL 500 MG PO TABS
500.0000 mg | ORAL_TABLET | Freq: Every day | ORAL | Status: DC
  Administered 2015-05-29: 500 mg via ORAL
  Filled 2015-05-29: qty 1

## 2015-05-29 MED ORDER — LORAZEPAM 0.5 MG PO TABS: 0.5000 mg | ORAL_TABLET | Freq: Four times a day (QID) | ORAL | Status: DC | PRN

## 2015-05-29 MED ORDER — OXYCODONE HCL 5 MG PO TABS: 5.0000 mg | ORAL_TABLET | ORAL | Status: DC | PRN

## 2015-05-29 MED ORDER — ONDANSETRON HCL 4 MG/2ML IJ SOLN
4.0000 mg | Freq: Four times a day (QID) | INTRAMUSCULAR | Status: DC | PRN
Start: 2015-05-29 — End: 2015-05-29

## 2015-05-29 NOTE — Progress Notes (Signed)
[facility] NAME: Neil Mcintyre    MR#:  [id number]  DATE OF BIRTH:  09/14/1975  SUBJECTIVE:  CHIEF COMPLAINT:   Chief Complaint  Patient presents with  . Rectal Bleeding    pt was admitted on approx the 11th of december and dx with diverticulitis followed up with surgeron was instructed to come to the ER if there were any changes prior to surgery. pt reports bright red blood after a bowel movement on the toilet paper also reports lower abdominal pain   -Patient with prior history of recurrent diverticulitis and also diverticulosis presented to the hospital secondary to rectal bleed. -Hasn't had any bowel movement since admission. So no further bleeding noted. -Hemoglobin has remained stable so far.  REVIEW OF SYSTEMS:  Review of Systems  Constitutional: Negative for fever and chills.  HENT: Negative for ear discharge, ear pain and tinnitus.   Respiratory: Negative for cough, shortness of breath and wheezing.   Cardiovascular: Negative for chest pain, palpitations, orthopnea and leg swelling.  Gastrointestinal: Positive for abdominal pain and blood in stool. Negative for nausea, vomiting, diarrhea and constipation.  Genitourinary: Negative for dysuria.  Musculoskeletal: Negative for myalgias and neck pain.  Neurological: Negative for dizziness, sensory change, speech change, focal weakness, seizures, weakness and headaches.  Endo/Heme/Allergies: Does not bruise/bleed easily.  Psychiatric/Behavioral: Negative for depression.    DRUG ALLERGIES:  No Known Allergies  VITALS:  Blood pressure 147/93, pulse 96, temperature 97.1 F (36.2 C), temperature source Oral, resp. rate 17, height 5' 6.25" (1.683 m), weight 112.492 kg (248 lb), SpO2 96 %.  PHYSICAL EXAMINATION:  Physical Exam  GENERAL:  39 y.o.-year-old patient lying in the bed with no acute distress.  EYES: Pupils equal, round, reactive to light and accommodation. No  scleral icterus. Extraocular muscles intact.  HEENT: Head atraumatic, normocephalic. Oropharynx and nasopharynx clear.  NECK:  Supple, no jugular venous distention. No thyroid enlargement, no tenderness.  LUNGS: Normal breath sounds bilaterally, no wheezing, rales,rhonchi or crepitation. No use of accessory muscles of respiration.  CARDIOVASCULAR: S1, S2 normal. No murmurs, rubs, or gallops.  ABDOMEN: Soft, mild discomfort on palpation in the left lower quadrant and also minimal and right lower quadrant. No guarding or rigidity noted., nondistended. Bowel sounds present. No organomegaly or mass.  EXTREMITIES: No pedal edema, cyanosis, or clubbing.  NEUROLOGIC: Cranial nerves II through XII are intact. Muscle strength 5/5 in all extremities. Sensation intact. Gait not checked.  PSYCHIATRIC: The patient is alert and oriented x 3.  SKIN: No obvious rash, lesion, or ulcer.    LABORATORY PANEL:   CBC  Recent Labs Lab 05/28/15 1957  WBC 12.2*  HGB 15.9  HCT 47.5  PLT 346   ------------------------------------------------------------------------------------------------------------------  Chemistries   Recent Labs Lab 05/28/15 1957  NA 135  K 3.9  CL 101  CO2 26  GLUCOSE 104*  BUN 17  CREATININE 0.68  CALCIUM 9.6  AST 17  ALT 22  ALKPHOS 55  BILITOT 0.7   ------------------------------------------------------------------------------------------------------------------  Cardiac Enzymes No results for input(s): TROPONINI in the last 168 hours. ------------------------------------------------------------------------------------------------------------------  RADIOLOGY:  No results found.  EKG:   Orders placed or performed in visit on 03/01/14  . EKG 12-Lead    ASSESSMENT AND PLAN:   39y/oM with PMH of HTN, recurrent diverticulitis and rectal bleed due to diverticulosis in the past presented to the hospital secondary to rectal bleed.  #1 rectal bleed-could be  hemorrhoidal bleed versus diverticular  bleed. No further bowel movements in the hospital. -Hemoglobin has remained stable yesterday. recheck another hemoglobin today. -If stable, advance diet and he should be able to go home later today. -Surgical consult as patient has been concerned about his recurrent diverticular bleeds and also diverticulitis. He has a elective colectomy scheduled in 2 months.  #2 recurrent diverticulitis-has Cipro and Flagyl as outpatient for 7 days as needed for his lower abdominal pain and tenderness. -Follow up with [facility] as needed. -No elevated white count at this time. If able to tolerate diet, should be able to go home. -Surgical consult requested.  #3 hypertension-continue Norvasc, losartan and hydrochlorothiazide.  #4 depression-continue Wellbutrin  Possible discharge later today if able to tolerate diet.    All the records are reviewed and case discussed with Care Management/Social Workerr. Management plans discussed with the patient, family and they are in agreement.  CODE STATUS: Full Code  TOTAL TIME TAKING CARE OF THIS PATIENT: 37 minutes.   POSSIBLE D/C IN 1-2 DAYS, DEPENDING ON CLINICAL CONDITION.   [name] M.D on 05/29/2015 at 1:08 PM  Between 7am to 6pm - Pager - [phone]  After 6pm go to [email or web address] - password [email or web address] [facility]  Office  [phone]  CC: Primary care physician; BABAOFF, Caryl Bis, MD

## 2015-05-29 NOTE — H&P (Signed)
Neil Mcintyre is an 39 y.o. male.   Chief Complaint: Rectal bleeding and abdominal pain HPI: 39 yr old male well known to [name] with history of diverticular bleed(~17yrprior) and diverticulitis(2 weeks ago).  Patient states that on the 12/23 he began seeing some blood on the toilet paper after wiping.  Patient and wife state that they noticed it worsening the next day and that there was some blood in the toilet bowl. He denies seeing clots, melena, blood on outside of stool, lightheadedness, dizziness or palpatations.  Patient states that he has not being drinking as much water as normal and then began having some cramping abdominal pain as well shortly before the bleeding episode.  He states his diet has been a little different with the holidays and that he still has a hard stool about twice a day.  He states that the abdominal pain has now resolved and denies any fever, chills, nausea or vomiting, diarrhea, dysuria or hematuria.   Past Medical History  Diagnosis Date  . Hypertension   . Diverticulitis   . Herpes     Past Surgical History  Procedure Laterality Date  . Colonoscopy w/ polypectomy    . Laparoscopic abdominal exploration      Family History  Problem Relation Age of Onset  . Adopted: Yes   Social History:  reports that he quit smoking about 2 years ago. He does not have any smokeless tobacco history on file. He reports that he drinks about 0.6 oz of alcohol per week. He reports that he does not use illicit drugs.  Allergies: No Known Allergies  Medications Prior to Admission  Medication Sig Dispense Refill  . amLODipine (NORVASC) 5 MG tablet Take 1 tablet by mouth daily.    .Marland KitchenbuPROPion (WELLBUTRIN XL) 150 MG 24 hr tablet Take 1 tablet by mouth daily.    .Marland KitchenLORazepam (ATIVAN) 0.5 MG tablet Take 1 tablet by mouth daily as needed.    .Marland Kitchenlosartan-hydrochlorothiazide (HYZAAR) 100-25 MG tablet Take 1 tablet by mouth daily.  2  . oxyCODONE (OXY IR/ROXICODONE) 5 MG immediate  release tablet Take 1 tablet (5 mg total) by mouth every 4 (four) hours as needed for moderate pain. 30 tablet 0  . potassium chloride SA (K-DUR,KLOR-CON) 20 MEQ tablet Take 1 tablet (20 mEq total) by mouth 2 (two) times daily. 30 tablet 1  . valACYclovir (VALTREX) 500 MG tablet Take 1 tablet by mouth daily.  11  . ciprofloxacin (CIPRO) 500 MG tablet Take 1 tablet (500 mg total) by mouth 2 (two) times daily. (Patient not taking: Reported on 05/29/2015) 30 tablet 1  . metroNIDAZOLE (FLAGYL) 500 MG tablet Take 1 tablet (500 mg total) by mouth 3 (three) times daily. (Patient not taking: Reported on 05/29/2015) 30 tablet 1    Results for orders placed or performed during the hospital encounter of 05/28/15 (from the past 48 hour(s))  Lipase, blood     Status: None   Collection Time: 05/28/15  7:57 PM  Result Value Ref Range   Lipase 31 11 - 51 U/L  Comprehensive metabolic panel     Status: Abnormal   Collection Time: 05/28/15  7:57 PM  Result Value Ref Range   Sodium 135 135 - 145 mmol/L   Potassium 3.9 3.5 - 5.1 mmol/L   Chloride 101 101 - 111 mmol/L   CO2 26 22 - 32 mmol/L   Glucose, Bld 104 (H) 65 - 99 mg/dL   BUN 17 6 - 20  mg/dL   Creatinine, Ser 0.68 0.61 - 1.24 mg/dL   Calcium 9.6 8.9 - 10.3 mg/dL   Total Protein 7.8 6.5 - 8.1 g/dL   Albumin 4.2 3.5 - 5.0 g/dL   AST 17 15 - 41 U/L   ALT 22 17 - 63 U/L   Alkaline Phosphatase 55 38 - 126 U/L   Total Bilirubin 0.7 0.3 - 1.2 mg/dL   GFR calc non Af Amer >60 >60 mL/min   GFR calc Af Amer >60 >60 mL/min    Comment: (NOTE) The eGFR has been calculated using the CKD EPI equation. This calculation has not been validated in all clinical situations. eGFR's persistently <60 mL/min signify possible Chronic Kidney Disease.    Anion gap 8 5 - 15  CBC     Status: Abnormal   Collection Time: 05/28/15  7:57 PM  Result Value Ref Range   WBC 12.2 (H) 3.8 - 10.6 K/uL   RBC 5.34 4.40 - 5.90 MIL/uL   Hemoglobin 15.9 13.0 - 18.0 g/dL   HCT 47.5  40.0 - 52.0 %   MCV 88.9 80.0 - 100.0 fL   MCH 29.7 26.0 - 34.0 pg   MCHC 33.4 32.0 - 36.0 g/dL   RDW 15.8 (H) 11.5 - 14.5 %   Platelets 346 150 - 440 K/uL  Urinalysis complete, with microscopic ([facility] only)     Status: Abnormal   Collection Time: 05/28/15  7:57 PM  Result Value Ref Range   Color, Urine YELLOW (A) YELLOW   APPearance CLEAR (A) CLEAR   Glucose, UA NEGATIVE NEGATIVE mg/dL   Bilirubin Urine NEGATIVE NEGATIVE   Ketones, ur NEGATIVE NEGATIVE mg/dL   Specific Gravity, Urine 1.017 1.005 - 1.030   Hgb urine dipstick NEGATIVE NEGATIVE   pH 6.0 5.0 - 8.0   Protein, ur NEGATIVE NEGATIVE mg/dL   Nitrite NEGATIVE NEGATIVE   Leukocytes, UA NEGATIVE NEGATIVE   RBC / HPF 0-5 0 - 5 RBC/hpf   WBC, UA 0-5 0 - 5 WBC/hpf   Bacteria, UA NONE SEEN NONE SEEN   Squamous Epithelial / LPF NONE SEEN NONE SEEN   Mucous PRESENT    Hyaline Casts, UA PRESENT   CBC     Status: Abnormal   Collection Time: 05/29/15  1:28 PM  Result Value Ref Range   WBC 11.6 (H) 3.8 - 10.6 K/uL   RBC 5.27 4.40 - 5.90 MIL/uL   Hemoglobin 15.6 13.0 - 18.0 g/dL   HCT 46.7 40.0 - 52.0 %   MCV 88.6 80.0 - 100.0 fL   MCH 29.5 26.0 - 34.0 pg   MCHC 33.3 32.0 - 36.0 g/dL   RDW 15.3 (H) 11.5 - 14.5 %   Platelets 338 150 - 440 K/uL   No results found.  Review of Systems  Constitutional: Positive for malaise/fatigue. Negative for fever, chills, weight loss and diaphoresis.  HENT: Negative for congestion and sore throat.   Respiratory: Negative for cough, sputum production and wheezing.   Cardiovascular: Negative for chest pain, palpitations and leg swelling.  Gastrointestinal: Positive for abdominal pain, constipation and blood in stool. Negative for heartburn, nausea, vomiting, diarrhea and melena.  Genitourinary: Negative for dysuria, urgency, frequency, hematuria and flank pain.  Musculoskeletal: Negative for myalgias, back pain, joint pain and neck pain.  Skin: Negative for itching and rash.  Neurological:  Negative for dizziness, tremors, loss of consciousness and weakness.  Psychiatric/Behavioral: Negative for depression. The patient is not nervous/anxious.   All other systems reviewed and  are negative.   Blood pressure 147/93, pulse 96, temperature 97.1 F (36.2 C), temperature source Oral, resp. rate 17, height 5' 6.25" (1.683 m), weight 248 lb (112.492 kg), SpO2 96 %. Physical Exam  Vitals reviewed. Constitutional: He is oriented to person, place, and time. He appears well-developed and well-nourished. No distress.  HENT:  Head: Normocephalic and atraumatic.  Right Ear: External ear normal.  Left Ear: External ear normal.  Nose: Nose normal.  Mouth/Throat: Oropharynx is clear and moist. No oropharyngeal exudate.  Eyes: Conjunctivae and EOM are normal. Pupils are equal, round, and reactive to light. No scleral icterus.  Neck: Normal range of motion. Neck supple. No tracheal deviation present.  Cardiovascular: Normal rate, regular rhythm, normal heart sounds and intact distal pulses.  Exam reveals no gallop and no friction rub.   No murmur heard. Respiratory: Effort normal and breath sounds normal. No respiratory distress. He has no wheezes.  GI: Soft. Bowel sounds are normal. He exhibits no distension. There is no tenderness. There is no rebound.  Obese, non-distended, non-tender on exam at this time  Genitourinary:  Rectal exam: good anal wink, palpable internal hemorrhoid, small, no blood, good tone  Musculoskeletal: Normal range of motion. He exhibits no edema or tenderness.  Neurological: He is alert and oriented to person, place, and time. No cranial nerve deficit.  Skin: Skin is warm and dry. No rash noted. No erythema. No pallor.  Psychiatric: He has a normal mood and affect. His behavior is normal. Judgment and thought content normal.     Assessment/Plan 39 yr old male with known diverticular disease with some rectal bleeding, could either be from recurrent flair of  diverticulitis or from irritation to hemorrhoid from constipation.  He does not have any acute blood loss symptoms and Hgb 15.  Will place on increased water intake, fiber intake and stool softeners, will have him complete a week course of Cipro/flagyl and return to see myself, Dr. Burt Knack or Dr. Adonis Huguenin in 2 weeks to ensure improvement in symptoms.  He was told to expect some mild bleeding which should improve but if he notices an increase in bleeding, clots or develops dizziness or weakness to call the office.  Goal would still be to let the diverticulitis resolve and remove his colon in about 3 months.    Lavona Mound Shahida Schnackenberg 05/29/2015, 3:42 PM

## 2015-05-29 NOTE — Discharge Summary (Signed)
[place] [facility] [place] NAME: Neil Mcintyre    MR#:  [id number]  DATE OF BIRTH:  June 08, 1975  DATE OF ADMISSION:  05/28/2015 ADMITTING PHYSICIAN: Lance Coon, MD  DATE OF DISCHARGE: 05/29/15  PRIMARY CARE PHYSICIAN: BABAOFF, MARC E, MD    ADMISSION DIAGNOSIS:  Rectal bleeding [K62.5] Diverticulosis of large intestine with hemorrhage [K57.31]  DISCHARGE DIAGNOSIS:  Principal Problem:   BRBPR (bright red blood per rectum) Active Problems:   Diverticulitis of large intestine with perforation with bleeding   Anxiety   Benign essential HTN   SECONDARY DIAGNOSIS:   Past Medical History  Diagnosis Date  . Hypertension   . Diverticulitis   . Herpes     HOSPITAL COURSE:   39y/oM with PMH of HTN, recurrent diverticulitis and rectal bleed due to diverticulosis in the past presented to the hospital secondary to rectal bleed.  #1 Rectal bleed-could be hemorrhoidal bleed versus diverticular bleed. No further bleeding now. -Hemoglobin has remained stable. . -tolerating soft diet and encouraged to drink plenty of fluids. And fibre diet. -Surgical consult appreciated and no intervention now. He has a elective colectomy scheduled in 2 months.  #2 recurrent diverticulitis-has Cipro and Flagyl as outpatient for 7 days as needed for his lower abdominal pain and tenderness. -Follow up with [facility] as needed. -No elevated white count at this time. Tolerating diet..  #3 hypertension-continue Norvasc, losartan and hydrochlorothiazide.  #4 depression-continue Wellbutrin   Discharge home today.   DISCHARGE CONDITIONS:   Stable  CONSULTS OBTAINED:  Treatment Team:  Hubbard Robinson, MD  DRUG ALLERGIES:  No Known Allergies  DISCHARGE MEDICATIONS:   Current Discharge Medication List    CONTINUE these medications which have NOT CHANGED   Details  amLODipine (NORVASC) 5 MG tablet Take 1 tablet by mouth daily.    buPROPion (WELLBUTRIN XL) 150 MG 24 hr tablet Take 1 tablet by mouth daily.    LORazepam (ATIVAN) 0.5 MG tablet Take 1 tablet by mouth daily as needed.    losartan-hydrochlorothiazide (HYZAAR) 100-25 MG tablet Take 1 tablet by mouth daily. Refills: 2    oxyCODONE (OXY IR/ROXICODONE) 5 MG immediate release tablet Take 1 tablet (5 mg total) by mouth every 4 (four) hours as needed for moderate pain. Qty: 30 tablet, Refills: 0    potassium chloride SA (K-DUR,KLOR-CON) 20 MEQ tablet Take 1 tablet (20 mEq total) by mouth 2 (two) times daily. Qty: 30 tablet, Refills: 1    valACYclovir (VALTREX) 500 MG tablet Take 1 tablet by mouth daily. Refills: 11    ciprofloxacin (CIPRO) 500 MG tablet Take 1 tablet (500 mg total) by mouth 2 (two) times daily. Qty: 30 tablet, Refills: 1    metroNIDAZOLE (FLAGYL) 500 MG tablet Take 1 tablet (500 mg total) by mouth 3 (three) times daily. Qty: 30 tablet, Refills: 1         DISCHARGE INSTRUCTIONS:   1. PCP follow up in 2 weeks  If you experience worsening of your admission symptoms, develop shortness of breath, life threatening emergency, suicidal or homicidal thoughts you must seek medical attention immediately by calling 911 or calling your MD immediately  if symptoms less severe.  You Must read complete instructions/literature along with all the possible adverse reactions/side effects for all the Medicines you take and that have been prescribed to you. Take any new Medicines after you have completely understood and accept all the possible adverse reactions/side effects.   Please note  You  were cared for by a hospitalist during your hospital stay. If you have any questions about your discharge medications or the care you received while you were in the hospital after you are discharged, you can call the unit and asked to speak with the hospitalist on call if the hospitalist that took care of you is not available. Once you are discharged, your primary  care physician will handle any further medical issues. Please note that NO REFILLS for any discharge medications will be authorized once you are discharged, as it is imperative that you return to your primary care physician (or establish a relationship with a primary care physician if you do not have one) for your aftercare needs so that they can reassess your need for medications and monitor your lab values.    Today   CHIEF COMPLAINT:   Chief Complaint  Patient presents with  . Rectal Bleeding    pt was admitted on approx the 11th of december and dx with diverticulitis followed up with surgeron was instructed to come to the ER if there were any changes prior to surgery. pt reports bright red blood after a bowel movement on the toilet paper also reports lower abdominal pain     VITAL SIGNS:  Blood pressure 147/93, pulse 96, temperature 97.1 F (36.2 C), temperature source Oral, resp. rate 17, height 5' 6.25" (1.683 m), weight 112.492 kg (248 lb), SpO2 96 %.  I/O:   Intake/Output Summary (Last 24 hours) at 05/29/15 1502 Last data filed at 05/29/15 1300  Gross per 24 hour  Intake    720 ml  Output      0 ml  Net    720 ml    PHYSICAL EXAMINATION:   Physical Exam  GENERAL: 39 y.o.-year-old patient lying in the bed with no acute distress.  EYES: Pupils equal, round, reactive to light and accommodation. No scleral icterus. Extraocular muscles intact.  HEENT: Head atraumatic, normocephalic. Oropharynx and nasopharynx clear.  NECK: Supple, no jugular venous distention. No thyroid enlargement, no tenderness.  LUNGS: Normal breath sounds bilaterally, no wheezing, rales,rhonchi or crepitation. No use of accessory muscles of respiration.  CARDIOVASCULAR: S1, S2 normal. No murmurs, rubs, or gallops.  ABDOMEN: Soft, mild discomfort on palpation in the left lower quadrant and also minimal and right lower quadrant. No guarding or rigidity noted., nondistended. Bowel sounds present.  No organomegaly or mass.  EXTREMITIES: No pedal edema, cyanosis, or clubbing.  NEUROLOGIC: Cranial nerves II through XII are intact. Muscle strength 5/5 in all extremities. Sensation intact. Gait not checked.  PSYCHIATRIC: The patient is alert and oriented x 3.  SKIN: No obvious rash, lesion, or ulcer.  DATA REVIEW:   CBC  Recent Labs Lab 05/29/15 1328  WBC 11.6*  HGB 15.6  HCT 46.7  PLT 338    Chemistries   Recent Labs Lab 05/28/15 1957  NA 135  K 3.9  CL 101  CO2 26  GLUCOSE 104*  BUN 17  CREATININE 0.68  CALCIUM 9.6  AST 17  ALT 22  ALKPHOS 55  BILITOT 0.7    Cardiac Enzymes No results for input(s): TROPONINI in the last 168 hours.  Microbiology Results  No results found for this or any previous visit.  RADIOLOGY:  No results found.  EKG:   Orders placed or performed in visit on 03/01/14  . EKG 12-Lead      Management plans discussed with the patient, family and they are in agreement.  CODE STATUS:  Code Status Orders        Start     Ordered   05/29/15 0354  Full code   Continuous     05/29/15 0353      TOTAL TIME TAKING CARE OF THIS PATIENT: 37 minutes.    [name] M.D on 05/29/2015 at 3:02 PM  Between 7am to 6pm - Pager - [phone]  After 6pm go to [email or web address] - password [email or web address] [facility]  Office  [phone]  CC: Primary care physician; BABAOFF, Caryl Bis, MD

## 2015-05-29 NOTE — H&P (Signed)
[place] [facility] [place] NAME: Neil Mcintyre    MR#:  [id number]  DATE OF BIRTH:  Sep 15, 1975  DATE OF ADMISSION:  05/28/2015  PRIMARY CARE PHYSICIAN: BABAOFF, Caryl Bis, MD   REQUESTING/REFERRING PHYSICIAN: Dahlia Client, M.D.  CHIEF COMPLAINT:   Chief Complaint  Patient presents with  . Rectal Bleeding    pt was admitted on approx the 11th of december and dx with diverticulitis followed up with surgeron was instructed to come to the ER if there were any changes prior to surgery. pt reports bright red blood after a bowel movement on the toilet paper also reports lower abdominal pain    HISTORY OF PRESENT ILLNESS:  Neil Mcintyre  is a 39 y.o. male who presents with bright red blood per rectum. Patient states that this is not a large volume, and that his bleeding has been mostly when he wipes. He has a recurring history of diverticulitis with perforation. He has been followed by general surgery, specifically Dr. Burt Knack. He reports that he was told in his most recent admission that he likely needs surgical intervention for this. Surgery for colectomy was scheduled tentatively for an elective basis for March, 2017. However, the patient states that he was told by his surgeon that if he felt anything was "off" that he should come back in to be evaluated. He states that he has had some abdominal pain, and is felt generally unwell, similar to prior episodes of his diverticulitis. He's also had this bleeding per rectum, and so decided to come be evaluated. Hemoglobin is stable, patient is hemodynamically stable, hospitalists were called for admission.  PAST MEDICAL HISTORY:   Past Medical History  Diagnosis Date  . Hypertension   . Diverticulitis   . Herpes     PAST SURGICAL HISTORY:   Past Surgical History  Procedure Laterality Date  . Colonoscopy w/ polypectomy    . Laparoscopic abdominal exploration      SOCIAL HISTORY:   Social History   Substance Use Topics  . Smoking status: Former Smoker    Quit date: 05/23/2013  . Smokeless tobacco: Not on file  . Alcohol Use: 0.6 oz/week    1 Cans of beer per week     Comment: socially    FAMILY HISTORY:   Family History  Problem Relation Age of Onset  . Adopted: Yes    DRUG ALLERGIES:  No Known Allergies  MEDICATIONS AT HOME:   Prior to Admission medications   Medication Sig Start Date End Date Taking? Authorizing Provider  amLODipine (NORVASC) 5 MG tablet Take 1 tablet by mouth daily. 02/13/15 02/13/16 Yes Historical Provider, MD  buPROPion (WELLBUTRIN XL) 150 MG 24 hr tablet Take 1 tablet by mouth daily. 02/16/15  Yes Historical Provider, MD  LORazepam (ATIVAN) 0.5 MG tablet Take 1 tablet by mouth daily as needed. 04/07/15  Yes Historical Provider, MD  losartan-hydrochlorothiazide (HYZAAR) 100-25 MG tablet Take 1 tablet by mouth daily. 04/17/15  Yes Historical Provider, MD  oxyCODONE (OXY IR/ROXICODONE) 5 MG immediate release tablet Take 1 tablet (5 mg total) by mouth every 4 (four) hours as needed for moderate pain. 05/12/15  Yes Florene Glen, MD  potassium chloride SA (K-DUR,KLOR-CON) 20 MEQ tablet Take 1 tablet (20 mEq total) by mouth 2 (two) times daily. 05/12/15  Yes Florene Glen, MD  valACYclovir (VALTREX) 500 MG tablet Take 1 tablet by mouth daily. 04/10/15  Yes Historical Provider, MD  ciprofloxacin (CIPRO) 500 MG tablet  Take 1 tablet (500 mg total) by mouth 2 (two) times daily. Patient not taking: Reported on 05/29/2015 05/24/15   [name], MD  metroNIDAZOLE (FLAGYL) 500 MG tablet Take 1 tablet (500 mg total) by mouth 3 (three) times daily. Patient not taking: Reported on 05/29/2015 05/24/15   [name], MD    REVIEW OF SYSTEMS:  Review of Systems  Constitutional: Negative for fever, chills, weight loss and malaise/fatigue.  HENT: Negative for ear pain, hearing loss and tinnitus.   Eyes: Negative for blurred vision, double vision, pain and  redness.  Respiratory: Negative for cough, hemoptysis and shortness of breath.   Cardiovascular: Negative for chest pain, palpitations, orthopnea and leg swelling.  Gastrointestinal: Positive for abdominal pain. Negative for nausea, vomiting, diarrhea and constipation.       Bleeding per rectum, see history of present illness  Genitourinary: Negative for dysuria, frequency and hematuria.  Musculoskeletal: Negative for back pain, joint pain and neck pain.  Skin:       No acne, rash, or lesions  Neurological: Negative for dizziness, tremors, focal weakness and weakness.  Endo/Heme/Allergies: Negative for polydipsia. Does not bruise/bleed easily.  Psychiatric/Behavioral: Negative for depression. The patient is not nervous/anxious and does not have insomnia.      VITAL SIGNS:   Filed Vitals:   05/29/15 0000 05/29/15 0030 05/29/15 0100 05/29/15 0121  BP: 132/81 91/48 126/62 137/82  Pulse:    77  Temp:      TempSrc:      Resp:    16  Height:      Weight:      SpO2:    99%   Wt Readings from Last 3 Encounters:  05/28/15 112.492 kg (248 lb)  05/24/15 113.399 kg (250 lb)  05/09/15 108.591 kg (239 lb 6.4 oz)    PHYSICAL EXAMINATION:  Physical Exam  Vitals reviewed. Constitutional: He is oriented to person, place, and time. He appears well-developed and well-nourished. No distress.  HENT:  Head: Normocephalic and atraumatic.  Mouth/Throat: Oropharynx is clear and moist.  Eyes: Conjunctivae and EOM are normal. Pupils are equal, round, and reactive to light. No scleral icterus.  Neck: Normal range of motion. Neck supple. No JVD present. No thyromegaly present.  Cardiovascular: Normal rate, regular rhythm and intact distal pulses.  Exam reveals no gallop and no friction rub.   No murmur heard. Respiratory: Effort normal and breath sounds normal. No respiratory distress. He has no wheezes. He has no rales.  GI: Soft. Bowel sounds are normal. He exhibits no distension. There is  tenderness (mild, lower quadrants).  Musculoskeletal: Normal range of motion. He exhibits no edema.  No arthritis, no gout  Lymphadenopathy:    He has no cervical adenopathy.  Neurological: He is alert and oriented to person, place, and time. No cranial nerve deficit.  No dysarthria, no aphasia  Skin: Skin is warm and dry. No rash noted. No erythema.  Psychiatric: He has a normal mood and affect. His behavior is normal. Judgment and thought content normal.    LABORATORY PANEL:   CBC  Recent Labs Lab 05/28/15 1957  WBC 12.2*  HGB 15.9  HCT 47.5  PLT 346   ------------------------------------------------------------------------------------------------------------------  Chemistries   Recent Labs Lab 05/28/15 1957  NA 135  K 3.9  CL 101  CO2 26  GLUCOSE 104*  BUN 17  CREATININE 0.68  CALCIUM 9.6  AST 17  ALT 22  ALKPHOS 55  BILITOT 0.7   ------------------------------------------------------------------------------------------------------------------  Cardiac Enzymes No results  for input(s): TROPONINI in the last 168 hours. ------------------------------------------------------------------------------------------------------------------  RADIOLOGY:  No results found.  EKG:   Orders placed or performed in visit on 03/01/14  . EKG 12-Lead    IMPRESSION AND PLAN:  Principal Problem:   BRBPR (bright red blood per rectum) - not reported as large volume. Hemoglobin is stable. Continue to monitor, other treatment as below. Active Problems:   Diverticulitis of large intestine with perforation with bleeding - history of perforation the past with significant bleeding due to diverticulitis. No imaging proven diverticulitis this episode, though clinically symptoms are similar to prior episodes. Patient was given outpatient prescriptions for Cipro and Flagyl to fill if he felt like his diverticulitis was flaring up. We will start Cipro and Flagyl while here, we'll get a  surgery consult for further evaluation and recommendations.   Anxiety - continue home dose anxiolytics   Benign essential HTN - continue home antihypertensives  All the records are reviewed and case discussed with ED provider. Management plans discussed with the patient and/or family.  DVT PROPHYLAXIS: Mechanical only  GI PROPHYLAXIS: None  ADMISSION STATUS: Observation  CODE STATUS: Full  TOTAL TIME TAKING CARE OF THIS PATIENT: 35 minutes.    Chantae Soo FIELDING 05/29/2015, 3:09 AM  [facility]  Office  [phone]  CC: Primary care physician; Marcello Fennel, MD

## 2015-05-29 NOTE — ED Provider Notes (Signed)
[facility] Emergency Department Provider Note  ____________________________________________  Time seen: Approximately 2336 AM  I have reviewed the triage vital signs and the nursing notes.   HISTORY  Chief Complaint Rectal Bleeding    HPI Neil Mcintyre is a 39 y.o. male who comes into the hospital today with bright red blood per rectum and stomach cramps. The patient reports that he started having some stomach cramps and blood in his stool yesterday. The patient was here 2 weeks ago with stomach cramps and diagnosed with diverticulitis. The patient reports that he has a surgery scheduled to remove his: Due to the episodes of diverticulosis. He reports though that the surgery to be done by Dr. Burt Knack is not scheduled until March. The patient reports that he was told to come in if the pain was worse or if he had blood in his stool. He reports that it was not blood and the toilet but was blood when he wiped. He reports that he has not had a good bowel movement since he was admitted to the hospital but he denies any straining or pushing to have the stool out. He reports that a year ago he had blood in the stool and almost died due to the amount of blood. She reports that this blood is not as much as previous but he was concerned. The patient rates his pain at this time a 7 out of 10 in intensity. He is having some lower abdominal cramping on both sides.   Past Medical History  Diagnosis Date  . Hypertension   . Diverticulitis   . Herpes     Patient Active Problem List   Diagnosis Date Noted  . BRBPR (bright red blood per rectum) 05/29/2015  . Diverticulitis of large intestine with perforation with bleeding   . Diverticulitis large intestine 05/09/2015  . Anxiety 02/21/2015  . Benign essential HTN 02/21/2015  . Other shoulder lesions, unspecified shoulder 11/12/2013  . Bursitis of shoulder 11/12/2013    Past Surgical History  Procedure Laterality Date  .  Colonoscopy w/ polypectomy    . Laparoscopic abdominal exploration      Current Outpatient Rx  Name  Route  Sig  Dispense  Refill  . amLODipine (NORVASC) 5 MG tablet   Oral   Take 1 tablet by mouth daily.         Marland Kitchen buPROPion (WELLBUTRIN XL) 150 MG 24 hr tablet   Oral   Take 1 tablet by mouth daily.         . ciprofloxacin (CIPRO) 500 MG tablet   Oral   Take 1 tablet (500 mg total) by mouth 2 (two) times daily.   30 tablet   1   . LORazepam (ATIVAN) 0.5 MG tablet   Oral   Take 1 tablet by mouth daily as needed.         Marland Kitchen losartan-hydrochlorothiazide (HYZAAR) 100-25 MG tablet   Oral   Take 1 tablet by mouth daily.      2   . metroNIDAZOLE (FLAGYL) 500 MG tablet   Oral   Take 1 tablet (500 mg total) by mouth 3 (three) times daily.   30 tablet   1   . oxyCODONE (OXY IR/ROXICODONE) 5 MG immediate release tablet   Oral   Take 1 tablet (5 mg total) by mouth every 4 (four) hours as needed for moderate pain.   30 tablet   0   . potassium chloride SA (K-DUR,KLOR-CON) 20 MEQ tablet  Oral   Take 1 tablet (20 mEq total) by mouth 2 (two) times daily.   30 tablet   1   . valACYclovir (VALTREX) 500 MG tablet   Oral   Take 1 tablet by mouth daily.      11     Allergies Review of patient's allergies indicates no known allergies.  Family History  Problem Relation Age of Onset  . Adopted: Yes    Social History Social History  Substance Use Topics  . Smoking status: Former Smoker    Quit date: 05/23/2013  . Smokeless tobacco: None  . Alcohol Use: 0.6 oz/week    1 Cans of beer per week     Comment: socially    Review of Systems Constitutional: No fever/chills Eyes: No visual changes. ENT: No sore throat. Cardiovascular: Denies chest pain. Respiratory: Denies shortness of breath. Gastrointestinal:  abdominal pain.  No nausea, no vomiting.  No diarrhea.  No constipation. Genitourinary: Bright red blood per rectum Musculoskeletal: Negative for back  pain. Skin: Negative for rash. Neurological: Negative for headaches, focal weakness or numbness.  10-point ROS otherwise negative.  ____________________________________________   PHYSICAL EXAM:  VITAL SIGNS: ED Triage Vitals  Enc Vitals Group     BP 05/28/15 1944 147/95 mmHg     Pulse Rate 05/28/15 1944 114     Resp 05/28/15 1944 18     Temp 05/28/15 1944 97.6 F (36.4 C)     Temp Source 05/28/15 1944 Oral     SpO2 05/28/15 1944 99 %     Weight 05/28/15 1944 248 lb (112.492 kg)     Height 05/28/15 1944 5' 6.25" (1.683 m)     Head Cir --      Peak Flow --      Pain Score 05/28/15 1953 7     Pain Loc --      Pain Edu? --      Excl. in [place]? --     Constitutional: Alert and oriented. Well appearing and in mild distress. Eyes: Conjunctivae are normal. PERRL. EOMI. Head: Atraumatic. Nose: No congestion/rhinnorhea. Mouth/Throat: Mucous membranes are moist.  Oropharynx non-erythematous. Cardiovascular: Normal rate, regular rhythm. Grossly normal heart sounds.  Good peripheral circulation. Respiratory: Normal respiratory effort.  No retractions. Lungs CTAB. Gastrointestinal: Soft with lower abd tenderness to palpation. No distention. Positive bowel sounds Musculoskeletal: No lower extremity tenderness nor edema.   Rectal: grossly bloody, heme positive stool. Neurologic:  Normal speech and language.  Skin:  Skin is warm, dry and intact.  Psychiatric: Mood and affect are normal.   ____________________________________________   LABS (all labs ordered are listed, but only abnormal results are displayed)  Labs Reviewed  COMPREHENSIVE METABOLIC PANEL - Abnormal; Notable for the following:    Glucose, Bld 104 (*)    All other components within normal limits  CBC - Abnormal; Notable for the following:    WBC 12.2 (*)    RDW 15.8 (*)    All other components within normal limits  URINALYSIS COMPLETEWITH MICROSCOPIC ([facility] ONLY) - Abnormal; Notable for the following:    Color,  Urine YELLOW (*)    APPearance CLEAR (*)    All other components within normal limits  LIPASE, BLOOD   ____________________________________________  EKG  none ____________________________________________  RADIOLOGY  none ____________________________________________   PROCEDURES  Procedure(s) performed: None  Critical Care performed: No  ____________________________________________   INITIAL IMPRESSION / ASSESSMENT AND PLAN / ED COURSE  Pertinent labs & imaging results that were available during  my care of the patient were reviewed by me and considered in my medical decision making (see chart for details).  This is a 39 year old male who comes into the hospital today with lower abdominal pain and bright red blood per rectum. The patient has a significant history of diverticulosis and diverticulitis which has caused bleeding. The patient has a plan for a hemicolectomy versus colectomy but developed the symptoms today. Given the patient's history of feel is appropriate to admit the patient to the hospital. His vital signs are stable at this time and his H&H is normal but since he has bled significantly from his diverticulosis in the past and feel it is important for him to be admitted for further evaluation for sooner surgery. I contacted the admitting doctors and they will evaluate the patient and consult surgery to evaluate the patient. The patient's pain did improve and I will give him 500 ML's of normal saline. ____________________________________________   FINAL CLINICAL IMPRESSION(S) / ED DIAGNOSES  Final diagnoses:  Rectal bleeding  Diverticulosis of large intestine with hemorrhage      Loney Hering, MD 05/29/15 [phone]

## 2015-05-29 NOTE — Progress Notes (Signed)
05/29/2015 15:30  Neil Mcintyre to be D/C'd Home per MD order.  Discussed prescriptions and follow up appointments with the patient. Prescriptions given to patient, medication list explained in detail. Pt verbalized understanding.    Medication List    TAKE these medications        amLODipine 5 MG tablet  Commonly known as:  NORVASC  Take 1 tablet by mouth daily.     buPROPion 150 MG 24 hr tablet  Commonly known as:  WELLBUTRIN XL  Take 1 tablet by mouth daily.     ciprofloxacin 500 MG tablet  Commonly known as:  CIPRO  Take 1 tablet (500 mg total) by mouth 2 (two) times daily.     LORazepam 0.5 MG tablet  Commonly known as:  ATIVAN  Take 1 tablet by mouth daily as needed.     losartan-hydrochlorothiazide 100-25 MG tablet  Commonly known as:  HYZAAR  Take 1 tablet by mouth daily.     metroNIDAZOLE 500 MG tablet  Commonly known as:  FLAGYL  Take 1 tablet (500 mg total) by mouth 3 (three) times daily.     oxyCODONE 5 MG immediate release tablet  Commonly known as:  Oxy IR/ROXICODONE  Take 1 tablet (5 mg total) by mouth every 4 (four) hours as needed for moderate pain.     potassium chloride SA 20 MEQ tablet  Commonly known as:  K-DUR,KLOR-CON  Take 1 tablet (20 mEq total) by mouth 2 (two) times daily.     valACYclovir 500 MG tablet  Commonly known as:  VALTREX  Take 1 tablet by mouth daily.        Filed Vitals:   05/29/15 1233 05/29/15 1234  BP: 157/87 147/93  Pulse: 96 96  Temp: 97.1 F (36.2 C)   Resp: 17     Skin clean, dry and intact without evidence of skin break down, no evidence of skin tears noted. IV catheter discontinued intact. Site without signs and symptoms of complications. Dressing and pressure applied. Pt denies pain at this time. No complaints noted.  An After Visit Summary was printed and given to the patient. Patient escorted via [place], and D/C home via private auto.  Dola Argyle

## 2015-05-30 ENCOUNTER — Ambulatory Visit: Payer: 59 | Admitting: Surgery

## 2015-06-29 ENCOUNTER — Ambulatory Visit: Payer: Self-pay | Admitting: General Surgery

## 2015-07-04 ENCOUNTER — Encounter: Payer: Self-pay | Admitting: Surgery

## 2015-07-04 ENCOUNTER — Ambulatory Visit (INDEPENDENT_AMBULATORY_CARE_PROVIDER_SITE_OTHER): Payer: 59 | Admitting: Surgery

## 2015-07-04 VITALS — BP 151/94 | HR 100 | Temp 98.5°F | Ht 66.0 in | Wt 238.0 lb

## 2015-07-04 DIAGNOSIS — K645 Perianal venous thrombosis: Secondary | ICD-10-CM

## 2015-07-04 DIAGNOSIS — K5721 Diverticulitis of large intestine with perforation and abscess with bleeding: Secondary | ICD-10-CM | POA: Diagnosis not present

## 2015-07-04 MED ORDER — HYDROCORTISONE ACETATE 25 MG RE SUPP: 25.0000 mg | Freq: Two times a day (BID) | RECTAL | Status: DC

## 2015-07-04 NOTE — Patient Instructions (Addendum)
We have sent in suppositories for your hemorrhoids to your pharmacy at this time. Please use these twice daily for the next 5 days. Then switch back to your hydrocortisone cream that you have been using over the counter.  Follow-up with Dr. Adonis Huguenin as scheduled below.

## 2015-07-04 NOTE — Progress Notes (Signed)
Outpatient Surgical Follow Up  07/04/2015  Neil Mcintyre is an 40 y.o. male.   CC: hemorrhoidal pain  HPI: patient for follow-up concerning his acute diverticulitis episodes which have completely resolved he denies pain and denies fevers chills and is having normal bowel movements.  Of note though he has had several days of perianal pain and has been medicating himself with external topical Preparation H only no use of hydrocortisone and no internal use. Denies hematochezia or melena  Past Medical History  Diagnosis Date  . Hypertension   . Diverticulitis   . Herpes     Past Surgical History  Procedure Laterality Date  . Colonoscopy w/ polypectomy    . Laparoscopic abdominal exploration      Family History  Problem Relation Age of Onset  . Adopted: Yes  . Family history unknown: Yes    Social History:  reports that he quit smoking about 2 years ago. He has never used smokeless tobacco. He reports that he drinks about 0.6 oz of alcohol per week. He reports that he does not use illicit drugs.  Allergies: No Known Allergies  Medications reviewed.   Review of Systems:   Review of Systems  Constitutional: Negative.   HENT: Negative.   Eyes: Negative.   Respiratory: Negative.   Cardiovascular: Negative.   Gastrointestinal: Negative for heartburn, nausea, vomiting, abdominal pain, diarrhea, constipation, blood in stool and melena.       Perianal pain  Genitourinary: Negative.   Musculoskeletal: Negative.   Skin: Negative.   Neurological: Negative.   Endo/Heme/Allergies: Negative.   Psychiatric/Behavioral: Negative.      Physical Exam:  BP 151/94 mmHg  Pulse 100  Temp(Src) 98.5 F (36.9 C) (Oral)  Ht 5\' 6"  (1.676 m)  Wt 238 lb (107.956 kg)  BMI 38.43 kg/m2  Physical Exam  Constitutional: He is oriented to person, place, and time and well-developed, well-nourished, and in no distress. No distress.  Obese no acute distress  HENT:  Head: Normocephalic and  atraumatic.  Eyes: Pupils are equal, round, and reactive to light. Right eye exhibits no discharge. Left eye exhibits no discharge. No scleral icterus.  Neck: Normal range of motion.  Cardiovascular: Normal rate, regular rhythm and normal heart sounds.   Pulmonary/Chest: Effort normal and breath sounds normal. No respiratory distress. He has no wheezes. He has no rales.  Abdominal: Soft. He exhibits no distension. There is no tenderness. There is no rebound and no guarding.  Genitourinary: Penis normal.  Thrombosed hemorrhoid on the right lateral region without overlying skin changes and no ecchymosis is only tender  Musculoskeletal: Normal range of motion. He exhibits no edema.  Lymphadenopathy:    He has no cervical adenopathy.  Neurological: He is alert and oriented to person, place, and time.  Skin: Skin is warm and dry. He is not diaphoretic. No erythema.  Psychiatric: Mood and affect normal.  Vitals reviewed.     No results found for this or any previous visit (from the past 48 hour(s)). No results found.  Assessment/Plan:  This is a patient with a history of perforated diverticulitis. He required hospitalization. He wants to discuss surgical intervention at this point. He has a new baby do this week and would like to have his surgery performed in March. He is very interested in the laparoscopic approach and has asked for Dr. Adonis Huguenin. The procedure was described for him in brief detail so that he would have further understanding and questions were answered for him.  Also  of note he has a thrombosed hemorrhoid which has been thrombosed for several days and is no longer discolored. He's been using Preparation H as an ointment on the outside only and has not plan and placing it internally. I have given him a prescription for Anusol suppositories for 5 days and given him instructions continue serving ongoing use of Preparation H for preventative purposes or him he understood and agreed to  proceed  Florene Glen, MD, FACS

## 2015-07-05 ENCOUNTER — Telehealth: Payer: Self-pay

## 2015-07-05 ENCOUNTER — Telehealth: Payer: Self-pay | Admitting: General Surgery

## 2015-07-05 ENCOUNTER — Ambulatory Visit
Admission: RE | Admit: 2015-07-05 | Discharge: 2015-07-05 | Disposition: A | Discharge status: Home / Self Care | Payer: 59 | Source: Ambulatory Visit | Attending: Surgery | Admitting: Surgery

## 2015-07-05 ENCOUNTER — Telehealth: Payer: Self-pay | Admitting: Surgery

## 2015-07-05 DIAGNOSIS — Z0189 Encounter for other specified special examinations: Secondary | ICD-10-CM | POA: Diagnosis present

## 2015-07-05 DIAGNOSIS — R1030 Lower abdominal pain, unspecified: Secondary | ICD-10-CM | POA: Insufficient documentation

## 2015-07-05 DIAGNOSIS — Q438 Other specified congenital malformations of intestine: Secondary | ICD-10-CM | POA: Insufficient documentation

## 2015-07-05 DIAGNOSIS — K639 Disease of intestine, unspecified: Secondary | ICD-10-CM | POA: Diagnosis not present

## 2015-07-05 LAB — CBC WITH DIFFERENTIAL/PLATELET
BASOS PCT: 0 %
Basophils Absolute: 0 10*3/uL (ref 0–0.1)
EOS ABS: 0.2 10*3/uL (ref 0–0.7)
Eosinophils Relative: 2 %
HCT: 43.6 % (ref 40.0–52.0)
HEMOGLOBIN: 14.4 g/dL (ref 13.0–18.0)
LYMPHS ABS: 1.2 10*3/uL (ref 1.0–3.6)
Lymphocytes Relative: 11 %
MCH: 29.8 pg (ref 26.0–34.0)
MCHC: 33 g/dL (ref 32.0–36.0)
MCV: 90.3 fL (ref 80.0–100.0)
MONO ABS: 1.4 10*3/uL — AB (ref 0.2–1.0)
MONOS PCT: 12 %
Neutro Abs: 8.6 10*3/uL — ABNORMAL HIGH (ref 1.4–6.5)
Neutrophils Relative %: 75 %
PLATELETS: 316 10*3/uL (ref 150–440)
RBC: 4.82 MIL/uL (ref 4.40–5.90)
RDW: 15 % — ABNORMAL HIGH (ref 11.5–14.5)
WBC: 11.4 10*3/uL — ABNORMAL HIGH (ref 3.8–10.6)

## 2015-07-05 MED ORDER — IOHEXOL 300 MG/ML  SOLN
100.0000 mL | Freq: Once | INTRAMUSCULAR | Status: AC | PRN
  Administered 2015-07-05: 100 mL via INTRAVENOUS

## 2015-07-05 MED ORDER — METRONIDAZOLE 500 MG PO TABS: 500.0000 mg | ORAL_TABLET | Freq: Three times a day (TID) | ORAL | Status: DC

## 2015-07-05 MED ORDER — CIPROFLOXACIN HCL 500 MG PO TABS: 500.0000 mg | ORAL_TABLET | Freq: Two times a day (BID) | ORAL | Status: DC

## 2015-07-05 NOTE — Telephone Encounter (Signed)
Patient called and stated that he had severe abdominal pain last night and even called the on call doctor at the hospital then remember Dr. Burt Knack said to let our office know first. He hasnt had a bowel movement. Please call

## 2015-07-05 NOTE — Telephone Encounter (Signed)
BLQ abdominal pain after eating pasta with chicken and alfredo sauce last night. Pt states that pain was a 10/10 at midnight- spoke with Dr. Bary Castilla suggested patient come to ER at that time but patient did not want to come in, 5/10 abdomen intermittent still at this time occuring every 1-2 hours, 1 episode of diarrhea at midnight right after speaking with Surgeon on call, no nausea but vomited once during painful episode, and cool sweats with pain. Patient feels like this is a different pain than previous pain.  Spoke with Dr. Burt Knack at this time. He ordered CBC, STAT- CT Scan Abdomen and Pelvis, and Cipro and Flagyl. Orders placed.  Scheduled CT Scan for today, 2/1 at [facility]. They want to send over the patient now. Pt must be NPO 4 hours prior to scan.   Returned phone call to patient at this time. He is making his way to the hospital at this time for scan. Informed patient of all instructions above. I informed patient that we would call back with results of scan.

## 2015-07-05 NOTE — Telephone Encounter (Signed)
Called with unusual, severe abdominal pain x 1 hour,unlike previous episodes of pain attributed to diverticulitis.  Seen earlier today by Dr. Burt Knack re: hemorrhoids. Sense of needing to move bowels without bowel movement. Limited options at [facility]. To come to ED if he feels he needs further assessment this evening.

## 2015-07-05 NOTE — Telephone Encounter (Signed)
Reviewed results of CBC and CT Scan of Abdomen with Dr. Burt Knack. He would like patient to follow-up with Dr. Adonis Huguenin as planned and take antibiotics until they are complete.  Cipro and Flagyl have already been sent to Pharmacy earlier today.  Called patient, reviewed results over the phone and explained to patient that he should continue his follow-up with Dr. Adonis Huguenin as planned on 07/19/15 and to finish the antibiotics completely. If he begins to have severe pain, fever, nausea/vomiting, or any of the symptoms he was having prior to his admission to the hospital, he either needs to call our office or call the on call surgeon if it is after hours.  Encouraged patient to call back with any other questions or concerns.

## 2015-07-19 ENCOUNTER — Ambulatory Visit (INDEPENDENT_AMBULATORY_CARE_PROVIDER_SITE_OTHER): Payer: 59 | Admitting: General Surgery

## 2015-07-19 ENCOUNTER — Encounter: Payer: Self-pay | Admitting: General Surgery

## 2015-07-19 VITALS — BP 149/91 | HR 121 | Temp 98.3°F | Wt 236.0 lb

## 2015-07-19 DIAGNOSIS — K5720 Diverticulitis of large intestine with perforation and abscess without bleeding: Secondary | ICD-10-CM | POA: Diagnosis not present

## 2015-07-19 MED ORDER — ERYTHROMYCIN BASE 500 MG PO TABS: 500.0000 mg | ORAL_TABLET | Freq: Four times a day (QID) | ORAL | Status: DC

## 2015-07-19 MED ORDER — POLYETHYLENE GLYCOL POWD: 17.0000 g | Freq: Every day | Status: DC

## 2015-07-19 MED ORDER — BISACODYL EC 5 MG PO TBEC: DELAYED_RELEASE_TABLET | ORAL | Status: DC

## 2015-07-19 MED ORDER — NEOMYCIN SULFATE 500 MG PO TABS: 1000.0000 mg | ORAL_TABLET | Freq: Three times a day (TID) | ORAL | Status: DC

## 2015-07-19 NOTE — Patient Instructions (Signed)
Surgery will be on 08/15/2015 (Laparoscopic Sigmoid Colectomy).  Remember that you will potentially be at the hospital for 3-5 days if done Laparoscopic. However, if its done open, it will be 6-10 days.   You will be out of work 4-6 weeks.

## 2015-07-19 NOTE — Progress Notes (Signed)
  Surgical Consultation  07/19/2015  Neil Mcintyre is an 40 y.o. male.   Chief Complaint  Patient presents with  . Follow-up    Discuss Hemorrhoids, colectomy and Diverticulitis     HPI: 40 year old male returns to clinic for follow-up of diverticulitis. Patient has had multiple bouts of home oral antibiotics. He states that he has been pain free. He denies any fevers, chills, nausea, vomiting, diarrhea, constipation, chest pain, shortness of breath. Patient states this is been his fifth or sixth course of diverticulitis and he remainspetrified that it will reoccur. Of note patient's wife had delivery of her second child approximately one week ago. Patient's previous diverticulitis attacks have been complicated. He's had numerous admissions for antibiotics and on admission earlier this year he required angioembolization of bleeding and also had a recent clinic visit for external hemorrhoid disease. Patient has no complaints and is currently and has returned to his usual baseline state of health. He is interested today in scheduling his colon surgery.   Past Medical History  Diagnosis Date  . Hypertension   . Diverticulitis   . Herpes     Past Surgical History  Procedure Laterality Date  . Colonoscopy w/ polypectomy    . Laparoscopic abdominal exploration      Family History  Problem Relation Age of Onset  . Adopted: Yes  . Family history unknown: Yes    Social History:  reports that he quit smoking about 2 years ago. He has never used smokeless tobacco. He reports that he drinks about 0.6 oz of alcohol per week. He reports that he does not use illicit drugs.  Allergies: No Known Allergies  Medications reviewed.     ROS  a multipoint review of systems was completed, all pertinent positives and negatives are documented within the history of present illness and the remainder are negative.    BP 149/91 mmHg  Pulse 121  Temp(Src) 98.3 F (36.8 C) (Oral)  Wt 107.049 kg  (236 lb)  Physical Exam  Gen.: No acute distress  eyes: pupils are equal, round, reactive to light and accommodation Neck: Supple and nontender  lymph nodes: No evidence of cervical, clavicular, axillary lymphadenopathy Chest: Clear to auscultation Heart: Tachycardic but without obvious rubs or gallops  abdomen: Soft, nontender, nondistended. Large in girth but no evidence of palpable masses or tenderness on exam  GU exam deferred  extremities: Moves all surgeries well without any evidence of pitting edema   No results found for this or any previous visit (from the past 48 hour(s)). No results found.  Assessment/Plan: 1. Diverticulitis of large intestine with perforation without bleeding  40 year old male with multiple bouts of recurrent sigmoid diverticulitis. Discussed the procedure in detail to include the risks, benefits, alternatives. Patient is wife voiced understanding and wished to proceed with a laparoscopic sigmoid colectomy. Plan will be for operative intervention on her about 08/15/2015. He'll require anesthesia preop before this due to his current tachycardia and mild hypertension , also should he have a recurrence of his symptoms if surgery could either be pushed up or delayed. Patient voiced understanding and wishes to proceed.   [name], MD FACS General Surgeon dermatitis

## 2015-07-20 ENCOUNTER — Telehealth: Payer: Self-pay | Admitting: General Surgery

## 2015-07-20 NOTE — Telephone Encounter (Signed)
I have called patient, not able to leave a message on voicemail. I have also called patient's wife to advise her of patients pre op date/time and sx date, no answer. I have left a message on wife's voicemail.  Sx: 08/16/15 with Dr. Gabrielle Dare Sigmoid Colectomy. Pre op: 08/08/15 @ 7:30am--Office.

## 2015-07-20 NOTE — Telephone Encounter (Signed)
Patient has called back and was notified of all surgery information.

## 2015-08-07 ENCOUNTER — Other Ambulatory Visit: Payer: Self-pay

## 2015-08-08 ENCOUNTER — Encounter
Admission: RE | Admit: 2015-08-08 | Discharge: 2015-08-08 | Disposition: A | Discharge status: Home / Self Care | Payer: 59 | Source: Ambulatory Visit | Attending: General Surgery | Admitting: General Surgery

## 2015-08-08 ENCOUNTER — Ambulatory Visit
Admission: RE | Admit: 2015-08-08 | Discharge: 2015-08-08 | Disposition: A | Discharge status: Home / Self Care | Payer: 59 | Source: Ambulatory Visit | Attending: General Surgery | Admitting: General Surgery

## 2015-08-08 DIAGNOSIS — Z01810 Encounter for preprocedural cardiovascular examination: Secondary | ICD-10-CM | POA: Diagnosis present

## 2015-08-08 DIAGNOSIS — J9811 Atelectasis: Secondary | ICD-10-CM | POA: Insufficient documentation

## 2015-08-08 HISTORY — DX: Anxiety disorder, unspecified: F41.9

## 2015-08-08 LAB — CBC WITH DIFFERENTIAL/PLATELET
BASOS ABS: 0.1 10*3/uL (ref 0–0.1)
Basophils Relative: 1 %
Eosinophils Absolute: 0.2 10*3/uL (ref 0–0.7)
Eosinophils Relative: 2 %
HEMATOCRIT: 44.6 % (ref 40.0–52.0)
HEMOGLOBIN: 14.6 g/dL (ref 13.0–18.0)
LYMPHS PCT: 13 %
Lymphs Abs: 1.1 10*3/uL (ref 1.0–3.6)
MCH: 29.6 pg (ref 26.0–34.0)
MCHC: 32.8 g/dL (ref 32.0–36.0)
MCV: 90.2 fL (ref 80.0–100.0)
Monocytes Absolute: 1.2 10*3/uL — ABNORMAL HIGH (ref 0.2–1.0)
Monocytes Relative: 14 %
NEUTROS ABS: 6.1 10*3/uL (ref 1.4–6.5)
Neutrophils Relative %: 70 %
PLATELETS: 277 10*3/uL (ref 150–440)
RBC: 4.95 MIL/uL (ref 4.40–5.90)
RDW: 14.6 % — ABNORMAL HIGH (ref 11.5–14.5)
WBC: 8.6 10*3/uL (ref 3.8–10.6)

## 2015-08-08 LAB — COMPREHENSIVE METABOLIC PANEL
ALK PHOS: 57 U/L (ref 38–126)
ALT: 26 U/L (ref 17–63)
AST: 23 U/L (ref 15–41)
Albumin: 3.8 g/dL (ref 3.5–5.0)
Anion gap: 8 (ref 5–15)
BILIRUBIN TOTAL: 0.8 mg/dL (ref 0.3–1.2)
BUN: 16 mg/dL (ref 6–20)
CHLORIDE: 104 mmol/L (ref 101–111)
CO2: 29 mmol/L (ref 22–32)
CREATININE: 0.87 mg/dL (ref 0.61–1.24)
Calcium: 9.6 mg/dL (ref 8.9–10.3)
GFR calc Af Amer: >60 mL/min (ref >60)
Glucose, Bld: 98 mg/dL (ref 65–99)
Potassium: 3.8 mmol/L (ref 3.5–5.1)
Sodium: 141 mmol/L (ref 135–145)
Total Protein: 7.1 g/dL (ref 6.5–8.1)

## 2015-08-08 LAB — APTT: APTT: 30 s (ref 24–36)

## 2015-08-08 LAB — PROTIME-INR
INR: 1.05
Prothrombin Time: 13.9 seconds (ref 11.4–15.0)

## 2015-08-08 LAB — SURGICAL PCR SCREEN
MRSA, PCR: NEGATIVE
STAPHYLOCOCCUS AUREUS: NEGATIVE

## 2015-08-08 LAB — HEMOGLOBIN A1C: HEMOGLOBIN A1C: 5.8 % (ref 4.0–6.0)

## 2015-08-08 NOTE — Patient Instructions (Signed)
  Your procedure is scheduled on: August 16, 2015 (Wednesday) Report to Day Surgery.[facility]) Second Floor To find out your arrival time please call [phone] between 1PM - 3PM on August 15, 2015 (Tuesday).  Remember: Instructions that are not followed completely may result in serious medical risk, up to and including death, or upon the discretion of your surgeon and anesthesiologist your surgery may need to be rescheduled.    __x__ 1. Do not eat food or drink liquids after midnight. No gum chewing or hard candies.     __x__ 2. No Alcohol for 24 hours before or after surgery.   ____ 3. Bring all medications with you on the day of surgery if instructed.    __x__ 4. Notify your doctor if there is any change in your medical condition     (cold, fever, infections).     Do not wear jewelry, make-up, hairpins, clips or nail polish.  Do not wear lotions, powders, or perfumes. You may wear deodorant.  Do not shave 48 hours prior to surgery. Men may shave face and neck.  Do not bring valuables to the hospital.    [facility] is not responsible for any belongings or valuables.               Contacts, dentures or bridgework may not be worn into surgery.  Leave your suitcase in the car. After surgery it may be brought to your room.  For patients admitted to the hospital, discharge time is determined by your                treatment team.   Patients discharged the day of surgery will not be allowed to drive home.   Please read over the following fact sheets that you were given:   MRSA Information and Surgical Site Infection Prevention   __x__ Take these medicines the morning of surgery with A SIP OF WATER:    1. Amlodipine (Norvasc)  2. Wellbutrin   3. FOLLOW DR. Adonis Huguenin COLON PREP INSTRUCTIONS AS ORDERED  ____ Fleet Enema (as directed)   __x__ Use CHG Soap as directed (SAGE CLOTHS)  ____ Use inhalers on the day of surgery  ____ Stop metformin 2 days prior to surgery    ____  Take 1/2 of usual insulin dose the night before surgery and none on the morning of surgery.   __x__ Stop Coumadin/Plavix/aspirin on (N/A)  __x__ Stop Anti-inflammatories on (NO NSAIDS) TYLENOL OK TO TAKE FOR PAIN IF NEEDED   ____ Stop supplements until after surgery.    ____ Bring C-Pap to the hospital.

## 2015-08-08 NOTE — Pre-Procedure Instructions (Signed)
CXR CALLED TO DR J ADAMS AND OK TO PROCEED

## 2015-08-16 ENCOUNTER — Inpatient Hospital Stay: Payer: 59 | Admitting: Certified Registered"

## 2015-08-16 ENCOUNTER — Inpatient Hospital Stay: Payer: 59

## 2015-08-16 ENCOUNTER — Inpatient Hospital Stay
Admission: RE | Admit: 2015-08-16 | Discharge: 2015-08-19 | DRG: 330 | Disposition: A | Discharge status: Home / Self Care | Payer: 59 | Source: Ambulatory Visit | Attending: General Surgery | Admitting: General Surgery

## 2015-08-16 ENCOUNTER — Encounter
Admission: RE | Disposition: A | Discharge status: Home / Self Care | Payer: Self-pay | Source: Ambulatory Visit | Attending: General Surgery

## 2015-08-16 ENCOUNTER — Encounter: Payer: Self-pay | Admitting: Emergency Medicine

## 2015-08-16 DIAGNOSIS — G2581 Restless legs syndrome: Secondary | ICD-10-CM | POA: Diagnosis present

## 2015-08-16 DIAGNOSIS — K660 Peritoneal adhesions (postprocedural) (postinfection): Secondary | ICD-10-CM | POA: Diagnosis present

## 2015-08-16 DIAGNOSIS — I10 Essential (primary) hypertension: Secondary | ICD-10-CM | POA: Diagnosis present

## 2015-08-16 DIAGNOSIS — K5732 Diverticulitis of large intestine without perforation or abscess without bleeding: Secondary | ICD-10-CM

## 2015-08-16 DIAGNOSIS — K5720 Diverticulitis of large intestine with perforation and abscess without bleeding: Principal | ICD-10-CM | POA: Diagnosis present

## 2015-08-16 DIAGNOSIS — Z87891 Personal history of nicotine dependence: Secondary | ICD-10-CM | POA: Diagnosis not present

## 2015-08-16 HISTORY — PX: LAPAROSCOPIC SIGMOID COLECTOMY: SHX5928

## 2015-08-16 HISTORY — PX: CYSTOSCOPY W/ RETROGRADES: SHX1426

## 2015-08-16 HISTORY — PX: CYSTOSCOPY WITH STENT PLACEMENT: SHX5790

## 2015-08-16 LAB — CBC
HCT: 46.6 % (ref 40.0–52.0)
Hemoglobin: 15.5 g/dL (ref 13.0–18.0)
MCH: 29.7 pg (ref 26.0–34.0)
MCHC: 33.2 g/dL (ref 32.0–36.0)
MCV: 89.4 fL (ref 80.0–100.0)
PLATELETS: 329 10*3/uL (ref 150–440)
RBC: 5.21 MIL/uL (ref 4.40–5.90)
RDW: 14.4 % (ref 11.5–14.5)
WBC: 18.5 10*3/uL — ABNORMAL HIGH (ref 3.8–10.6)

## 2015-08-16 LAB — CREATININE, SERUM
CREATININE: 1.8 mg/dL — AB (ref 0.61–1.24)
GFR calc Af Amer: 53 mL/min — ABNORMAL LOW (ref >60)
GFR calc non Af Amer: 46 mL/min — ABNORMAL LOW (ref >60)

## 2015-08-16 SURGERY — COLECTOMY, SIGMOID, LAPAROSCOPIC
Anesthesia: General | Wound class: Clean Contaminated

## 2015-08-16 MED ORDER — BUPIVACAINE-EPINEPHRINE (PF) 0.5% -1:200000 IJ SOLN
INTRAMUSCULAR | Status: AC
  Filled 2015-08-16: qty 30

## 2015-08-16 MED ORDER — HYDROMORPHONE HCL 1 MG/ML IJ SOLN
INTRAMUSCULAR | Status: AC
  Filled 2015-08-16: qty 1

## 2015-08-16 MED ORDER — DIPHENHYDRAMINE HCL 25 MG PO CAPS: 25.0000 mg | ORAL_CAPSULE | Freq: Four times a day (QID) | ORAL | Status: DC | PRN

## 2015-08-16 MED ORDER — HYDROMORPHONE HCL 1 MG/ML IJ SOLN
INTRAMUSCULAR | Status: DC | PRN
  Administered 2015-08-16 (×2): 0.5 mg via INTRAVENOUS

## 2015-08-16 MED ORDER — DEXMEDETOMIDINE HCL IN NACL 200 MCG/50ML IV SOLN
INTRAVENOUS | Status: DC | PRN
  Administered 2015-08-16: 12 ug via INTRAVENOUS
  Administered 2015-08-16: 8 ug via INTRAVENOUS

## 2015-08-16 MED ORDER — ONDANSETRON HCL 4 MG PO TABS: 4.0000 mg | ORAL_TABLET | Freq: Four times a day (QID) | ORAL | Status: DC | PRN

## 2015-08-16 MED ORDER — BUPIVACAINE-EPINEPHRINE (PF) 0.5% -1:200000 IJ SOLN
INTRAMUSCULAR | Status: DC | PRN
  Administered 2015-08-16: 30 mL

## 2015-08-16 MED ORDER — FENTANYL CITRATE (PF) 100 MCG/2ML IJ SOLN
25.0000 ug | INTRAMUSCULAR | Status: DC | PRN
  Administered 2015-08-16 (×2): 25 ug via INTRAVENOUS

## 2015-08-16 MED ORDER — FENTANYL CITRATE (PF) 100 MCG/2ML IJ SOLN
INTRAMUSCULAR | Status: AC
  Filled 2015-08-16: qty 2

## 2015-08-16 MED ORDER — ACETAMINOPHEN 10 MG/ML IV SOLN
INTRAVENOUS | Status: DC | PRN
  Administered 2015-08-16: 1000 mg via INTRAVENOUS

## 2015-08-16 MED ORDER — DEXTROSE 5 % IV SOLN
2.0000 g | INTRAVENOUS | Status: AC
  Administered 2015-08-16: 2 g via INTRAVENOUS
  Filled 2015-08-16: qty 2

## 2015-08-16 MED ORDER — ALVIMOPAN 12 MG PO CAPS
ORAL_CAPSULE | ORAL | Status: AC
Start: 2015-08-16 — End: 2015-08-16
  Administered 2015-08-16: 12 mg via ORAL
  Filled 2015-08-16: qty 1

## 2015-08-16 MED ORDER — ONDANSETRON HCL 4 MG/2ML IJ SOLN
4.0000 mg | Freq: Once | INTRAMUSCULAR | Status: AC | PRN
  Administered 2015-08-16: 4 mg via INTRAVENOUS

## 2015-08-16 MED ORDER — CHLORHEXIDINE GLUCONATE CLOTH 2 % EX PADS: 6.0000 | MEDICATED_PAD | Freq: Once | CUTANEOUS | Status: DC

## 2015-08-16 MED ORDER — SUCCINYLCHOLINE CHLORIDE 20 MG/ML IJ SOLN
INTRAMUSCULAR | Status: DC | PRN
  Administered 2015-08-16: 100 mg via INTRAVENOUS

## 2015-08-16 MED ORDER — FENTANYL CITRATE (PF) 100 MCG/2ML IJ SOLN
INTRAMUSCULAR | Status: DC | PRN
  Administered 2015-08-16 (×2): 50 ug via INTRAVENOUS
  Administered 2015-08-16: 100 ug via INTRAVENOUS
  Administered 2015-08-16 (×6): 50 ug via INTRAVENOUS

## 2015-08-16 MED ORDER — HYDROCHLOROTHIAZIDE 25 MG PO TABS
25.0000 mg | ORAL_TABLET | Freq: Every day | ORAL | Status: DC
  Administered 2015-08-17 – 2015-08-19 (×3): 25 mg via ORAL
  Filled 2015-08-16 (×4): qty 1

## 2015-08-16 MED ORDER — PROPOFOL 10 MG/ML IV BOLUS
INTRAVENOUS | Status: DC | PRN
  Administered 2015-08-16: 200 mg via INTRAVENOUS

## 2015-08-16 MED ORDER — PHENYLEPHRINE HCL 10 MG/ML IJ SOLN
INTRAMUSCULAR | Status: DC | PRN
  Administered 2015-08-16 (×2): 100 ug via INTRAVENOUS

## 2015-08-16 MED ORDER — ALUM & MAG HYDROXIDE-SIMETH 200-200-20 MG/5ML PO SUSP: 30.0000 mL | Freq: Four times a day (QID) | ORAL | Status: DC | PRN

## 2015-08-16 MED ORDER — MORPHINE SULFATE (PF) 4 MG/ML IV SOLN
4.0000 mg | INTRAVENOUS | Status: DC | PRN
  Administered 2015-08-16 – 2015-08-18 (×4): 4 mg via INTRAVENOUS
  Filled 2015-08-16 (×4): qty 1

## 2015-08-16 MED ORDER — FAMOTIDINE 20 MG PO TABS
20.0000 mg | ORAL_TABLET | Freq: Once | ORAL | Status: AC
  Administered 2015-08-16: 20 mg via ORAL

## 2015-08-16 MED ORDER — ONDANSETRON HCL 4 MG/2ML IJ SOLN
INTRAMUSCULAR | Status: AC
  Filled 2015-08-16: qty 2

## 2015-08-16 MED ORDER — HYDROMORPHONE HCL 1 MG/ML IJ SOLN: 0.2500 mg | INTRAMUSCULAR | Status: DC | PRN

## 2015-08-16 MED ORDER — LOSARTAN POTASSIUM-HCTZ 100-25 MG PO TABS: 1.0000 | ORAL_TABLET | Freq: Every day | ORAL | Status: DC

## 2015-08-16 MED ORDER — ONDANSETRON HCL 4 MG/2ML IJ SOLN: 4.0000 mg | Freq: Four times a day (QID) | INTRAMUSCULAR | Status: DC | PRN

## 2015-08-16 MED ORDER — FAMOTIDINE 20 MG PO TABS
ORAL_TABLET | ORAL | Status: AC
  Administered 2015-08-16: 20 mg via ORAL
  Filled 2015-08-16: qty 1

## 2015-08-16 MED ORDER — METOPROLOL TARTRATE 1 MG/ML IV SOLN
INTRAVENOUS | Status: DC | PRN
  Administered 2015-08-16 (×2): 2.5 mg via INTRAVENOUS

## 2015-08-16 MED ORDER — KCL IN DEXTROSE-NACL 20-5-0.45 MEQ/L-%-% IV SOLN
INTRAVENOUS | Status: DC
  Administered 2015-08-16 – 2015-08-18 (×6): via INTRAVENOUS
  Filled 2015-08-16 (×8): qty 1000

## 2015-08-16 MED ORDER — DEXAMETHASONE SODIUM PHOSPHATE 4 MG/ML IJ SOLN
INTRAMUSCULAR | Status: DC | PRN
  Administered 2015-08-16: 10 mg via INTRAVENOUS

## 2015-08-16 MED ORDER — AMLODIPINE BESYLATE 5 MG PO TABS
5.0000 mg | ORAL_TABLET | Freq: Every day | ORAL | Status: DC
  Administered 2015-08-17 – 2015-08-19 (×3): 5 mg via ORAL
  Filled 2015-08-16 (×4): qty 1

## 2015-08-16 MED ORDER — ENOXAPARIN SODIUM 40 MG/0.4ML ~~LOC~~ SOLN
40.0000 mg | Freq: Once | SUBCUTANEOUS | Status: AC
  Administered 2015-08-16: 40 mg via SUBCUTANEOUS
  Filled 2015-08-16: qty 0.4

## 2015-08-16 MED ORDER — ENOXAPARIN SODIUM 40 MG/0.4ML ~~LOC~~ SOLN
40.0000 mg | SUBCUTANEOUS | Status: DC
  Administered 2015-08-17 – 2015-08-18 (×2): 40 mg via SUBCUTANEOUS
  Filled 2015-08-16 (×3): qty 0.4

## 2015-08-16 MED ORDER — LIDOCAINE HCL (CARDIAC) 20 MG/ML IV SOLN
INTRAVENOUS | Status: DC | PRN
  Administered 2015-08-16: 50 mg via INTRAVENOUS

## 2015-08-16 MED ORDER — ONDANSETRON HCL 4 MG/2ML IJ SOLN
INTRAMUSCULAR | Status: DC | PRN
  Administered 2015-08-16: 4 mg via INTRAVENOUS

## 2015-08-16 MED ORDER — LOSARTAN POTASSIUM 50 MG PO TABS
100.0000 mg | ORAL_TABLET | Freq: Every day | ORAL | Status: DC
  Administered 2015-08-17 – 2015-08-19 (×3): 100 mg via ORAL
  Filled 2015-08-16 (×3): qty 2

## 2015-08-16 MED ORDER — LACTATED RINGERS IV SOLN
INTRAVENOUS | Status: DC
  Administered 2015-08-16 (×3): via INTRAVENOUS

## 2015-08-16 MED ORDER — HYDROCODONE-ACETAMINOPHEN 5-325 MG PO TABS
1.0000 | ORAL_TABLET | ORAL | Status: DC | PRN
  Administered 2015-08-17 (×3): 2 via ORAL
  Administered 2015-08-17: 1 via ORAL
  Administered 2015-08-18 – 2015-08-19 (×6): 2 via ORAL
  Filled 2015-08-16: qty 1
  Filled 2015-08-16 (×9): qty 2

## 2015-08-16 MED ORDER — SUGAMMADEX SODIUM 200 MG/2ML IV SOLN
INTRAVENOUS | Status: DC | PRN
  Administered 2015-08-16: 200 mg via INTRAVENOUS

## 2015-08-16 MED ORDER — MIDAZOLAM HCL 2 MG/2ML IJ SOLN
INTRAMUSCULAR | Status: DC | PRN
  Administered 2015-08-16: 2 mg via INTRAVENOUS

## 2015-08-16 MED ORDER — ACETAMINOPHEN 10 MG/ML IV SOLN
INTRAVENOUS | Status: AC
  Filled 2015-08-16: qty 100

## 2015-08-16 MED ORDER — ALVIMOPAN 12 MG PO CAPS
12.0000 mg | ORAL_CAPSULE | Freq: Once | ORAL | Status: AC
  Administered 2015-08-16: 12 mg via ORAL

## 2015-08-16 MED ORDER — ROCURONIUM BROMIDE 100 MG/10ML IV SOLN
INTRAVENOUS | Status: DC | PRN
  Administered 2015-08-16 (×3): 10 mg via INTRAVENOUS
  Administered 2015-08-16 (×2): 20 mg via INTRAVENOUS
  Administered 2015-08-16: 30 mg via INTRAVENOUS
  Administered 2015-08-16: 10 mg via INTRAVENOUS

## 2015-08-16 MED ORDER — HYDROMORPHONE HCL 1 MG/ML IJ SOLN
0.5000 mg | INTRAMUSCULAR | Status: AC | PRN
  Administered 2015-08-16 (×4): 0.5 mg via INTRAVENOUS

## 2015-08-16 MED ORDER — DIPHENHYDRAMINE HCL 50 MG/ML IJ SOLN: 25.0000 mg | Freq: Four times a day (QID) | INTRAMUSCULAR | Status: DC | PRN

## 2015-08-16 SURGICAL SUPPLY — 84 items
ADHESIVE MASTISOL STRL (MISCELLANEOUS) ×4 IMPLANT
BACTOSHIELD CHG 4% 4OZ (MISCELLANEOUS) ×2
BAG BILE T-TUBES STRL (MISCELLANEOUS) ×8 IMPLANT
BLADE SURG SZ10 CARB STEEL (BLADE) ×4 IMPLANT
CANISTER SUCT 1200ML W/VALVE (MISCELLANEOUS) ×4 IMPLANT
CATH TRAY 16F METER LATEX (MISCELLANEOUS) IMPLANT
CATH URETL 5X70 OPEN END (CATHETERS) ×8 IMPLANT
CHLORAPREP W/TINT 26ML (MISCELLANEOUS) ×4 IMPLANT
CLIP TI LARGE 6 (CLIP) IMPLANT
CLIP TI MEDIUM 6 (CLIP) IMPLANT
CLOSURE WOUND 1/2 X4 (GAUZE/BANDAGES/DRESSINGS) ×1
CONRAY 43 FOR UROLOGY 50M (MISCELLANEOUS) ×4 IMPLANT
DEFOGGER SCOPE WARMER CLEARIFY (MISCELLANEOUS) ×4 IMPLANT
DRAPE LEGGINS SURG 28X43 STRL (DRAPES) IMPLANT
ELECT BLADE 6.5 EXT (BLADE) ×4 IMPLANT
ELECT CAUTERY BLADE 6.4 (BLADE) ×4 IMPLANT
ELECT REM PT RETURN 9FT ADLT (ELECTROSURGICAL) ×4
ELECTRODE REM PT RTRN 9FT ADLT (ELECTROSURGICAL) ×2 IMPLANT
GAUZE SPONGE 4X4 12PLY STRL (GAUZE/BANDAGES/DRESSINGS) ×4 IMPLANT
GLOVE BIO SURGEON STRL SZ7 (GLOVE) ×8 IMPLANT
GLOVE BIO SURGEON STRL SZ7.5 (GLOVE) ×20 IMPLANT
GLOVE INDICATOR 8.0 STRL GRN (GLOVE) ×8 IMPLANT
GOWN STRL REUS W/ TWL LRG LVL3 (GOWN DISPOSABLE) ×14 IMPLANT
GOWN STRL REUS W/ TWL LRG LVL4 (GOWN DISPOSABLE) ×2 IMPLANT
GOWN STRL REUS W/TWL LRG LVL3 (GOWN DISPOSABLE) ×14
GOWN STRL REUS W/TWL LRG LVL4 (GOWN DISPOSABLE) ×2
GOWN STRL REUS W/TWL XL LVL3 (GOWN DISPOSABLE) ×4 IMPLANT
HANDLE YANKAUER SUCT BULB TIP (MISCELLANEOUS) ×4 IMPLANT
IRRIGATION STRYKERFLOW (MISCELLANEOUS) ×2 IMPLANT
IRRIGATOR STRYKERFLOW (MISCELLANEOUS) ×4
IV NS 1000ML (IV SOLUTION) ×2
IV NS 1000ML BAXH (IV SOLUTION) ×2 IMPLANT
KIT RM TURNOVER CYSTO AR (KITS) ×4 IMPLANT
KIT RM TURNOVER STRD PROC AR (KITS) ×4 IMPLANT
LABEL OR SOLS (LABEL) ×4 IMPLANT
LIGASURE BLUNT 5MM 37CM (INSTRUMENTS) ×4 IMPLANT
NEEDLE HYPO 25X1 1.5 SAFETY (NEEDLE) ×4 IMPLANT
NEEDLE VERESS 14GA 120MM (NEEDLE) ×4 IMPLANT
NS IRRIG 1000ML POUR BTL (IV SOLUTION) ×4 IMPLANT
PACK COLON CLEAN CLOSURE (MISCELLANEOUS) IMPLANT
PACK CYSTO AR (MISCELLANEOUS) ×4 IMPLANT
PACK LAP CHOLECYSTECTOMY (MISCELLANEOUS) ×4 IMPLANT
PAD PREP 24X41 OB/GYN DISP (PERSONAL CARE ITEMS) ×4 IMPLANT
PENCIL ELECTRO HAND CTR (MISCELLANEOUS) ×4 IMPLANT
RELOAD BLUE (STAPLE) ×8 IMPLANT
RELOAD STAPLER BLUE 60MM (STAPLE) ×2 IMPLANT
RETRACTOR WOUND ALXS 18CM MED (MISCELLANEOUS) IMPLANT
RETRACTOR WOUND ALXS 18CM SML (MISCELLANEOUS) ×2 IMPLANT
RTRCTR WOUND ALEXIS O 18CM MED (MISCELLANEOUS)
RTRCTR WOUND ALEXIS O 18CM SML (MISCELLANEOUS) ×4
SCISSORS METZENBAUM CVD 33 (INSTRUMENTS) ×4 IMPLANT
SCRUB CHG 4% DYNA-HEX 4OZ (MISCELLANEOUS) ×2 IMPLANT
SENSORWIRE 0.038 NOT ANGLED (WIRE) ×4
SET CYSTO W/LG BORE CLAMP LF (SET/KITS/TRAYS/PACK) ×4 IMPLANT
SLEEVE ENDOPATH XCEL 5M (ENDOMECHANICALS) ×4 IMPLANT
SOL .9 NS 3000ML IRR  AL (IV SOLUTION) ×2
SOL .9 NS 3000ML IRR UROMATIC (IV SOLUTION) ×2 IMPLANT
SOL PREP PVP 2OZ (MISCELLANEOUS) ×4
SOLUTION PREP PVP 2OZ (MISCELLANEOUS) ×2 IMPLANT
SPONGE LAP 18X18 5 PK (GAUZE/BANDAGES/DRESSINGS) ×4 IMPLANT
STAPLE ECHEON FLEX 60 POW ENDO (STAPLE) ×4 IMPLANT
STAPLER ENDO ILS CVD 18 33 (STAPLE) ×4 IMPLANT
STAPLER RELOAD BLUE 60MM (STAPLE) ×4
STAPLER SKIN PROX 35W (STAPLE) IMPLANT
STENT URET 6FRX24 CONTOUR (STENTS) IMPLANT
STENT URET 6FRX26 CONTOUR (STENTS) IMPLANT
STRIP CLOSURE SKIN 1/2X4 (GAUZE/BANDAGES/DRESSINGS) ×3 IMPLANT
SURGILUBE 2OZ TUBE FLIPTOP (MISCELLANEOUS) ×8 IMPLANT
SUT ETHILON 3-0 (SUTURE) ×4 IMPLANT
SUT MNCRL 4-0 (SUTURE) ×4
SUT MNCRL 4-0 27XMFL (SUTURE) ×4
SUT SILK 2 0 (SUTURE) ×2
SUT SILK 2-0 18XBRD TIE 12 (SUTURE) ×2 IMPLANT
SUT SILK 3-0 (SUTURE) ×2
SUT SILK 3-0 SH-1 18XCR BRD (SUTURE) ×2
SUT VIC AB 2-0 CT1 27 (SUTURE) ×2
SUT VIC AB 2-0 CT1 TAPERPNT 27 (SUTURE) ×2 IMPLANT
SUTURE MNCRL 4-0 27XMF (SUTURE) ×4 IMPLANT
SUTURE SILK 3-0 SH-1 18XCR BRD (SUTURE) ×2 IMPLANT
TROCAR XCEL 12X100 BLDLESS (ENDOMECHANICALS) ×4 IMPLANT
TROCAR XCEL NON-BLD 5MMX100MML (ENDOMECHANICALS) ×4 IMPLANT
TUBING INSUFFLATOR HEATED (MISCELLANEOUS) ×4 IMPLANT
WATER STERILE IRR 1000ML POUR (IV SOLUTION) ×8 IMPLANT
WIRE SENSOR 0.038 NOT ANGLED (WIRE) ×2 IMPLANT

## 2015-08-16 NOTE — Transfer of Care (Signed)
Immediate Anesthesia Transfer of Care Note  Patient: Neil Mcintyre  Procedure(s) Performed: Procedure(s): LAPAROSCOPIC SIGMOID COLECTOMY (N/A) CYSTOSCOPY WITH RETROGRADE PYELOGRAM (Bilateral) CYSTOSCOPY WITH STENT PLACEMENT (Bilateral)  Patient Location: PACU  Anesthesia Type:General  Level of Consciousness: awake  Airway & Oxygen Therapy: Patient Spontanous Breathing and Patient connected to face mask oxygen  Post-op Assessment: Report given to RN  Post vital signs: Reviewed  Last Vitals:  Filed Vitals:   08/16/15 0608 08/16/15 1347  BP: 161/100 152/91  Pulse: 110 105  Temp: 36.5 C 37.1 C  Resp: 20 18    Complications: No apparent anesthesia complications

## 2015-08-16 NOTE — Brief Op Note (Signed)
08/16/2015  1:41 PM  PATIENT:  Neil Mcintyre  40 y.o. male  PRE-OPERATIVE DIAGNOSIS:  diverticulitis of large intestine  POST-OPERATIVE DIAGNOSIS:  diverticulitis of large intestine  PROCEDURE:  Procedure(s): LAPAROSCOPIC SIGMOID COLECTOMY (N/A) CYSTOSCOPY WITH RETROGRADE PYELOGRAM (Bilateral) CYSTOSCOPY WITH STENT PLACEMENT (Bilateral)  SURGEON:  Surgeon(s) and Role: Panel 1:    * [name], MD - Primary  Panel 2:    * [name], MD - Primary  PHYSICIAN ASSISTANT:   ASSISTANTS: Dr. Azalee Course   ANESTHESIA:   general  EBL:  Total I/O In: 2200 [I.V.:2200] Out: 150 [Blood:150]  BLOOD ADMINISTERED:none  DRAINS: none   LOCAL MEDICATIONS USED:  MARCAINE     SPECIMEN:  Source of Specimen:  sigmoid colon  DISPOSITION OF SPECIMEN:  PATHOLOGY  COUNTS:  YES  TOURNIQUET:  * No tourniquets in log *  DICTATION: .Dragon Dictation  PLAN OF CARE: Admit to inpatient   PATIENT DISPOSITION:  PACU - hemodynamically stable.   Delay start of Pharmacological VTE agent (>24hrs) due to surgical blood loss or risk of bleeding: no

## 2015-08-16 NOTE — Anesthesia Procedure Notes (Signed)
Procedure Name: Intubation Performed by: An Lannan Pre-anesthesia Checklist: Patient identified, Patient being monitored, Timeout performed, Emergency Drugs available and Suction available Patient Re-evaluated:Patient Re-evaluated prior to inductionOxygen Delivery Method: Circle system utilized Preoxygenation: Pre-oxygenation with 100% oxygen Intubation Type: IV induction and Rapid sequence Ventilation: Mask ventilation without difficulty Laryngoscope Size: Miller and 2 Grade View: Grade I Tube type: Oral Tube size: 7.5 mm Number of attempts: 1 Airway Equipment and Method: Stylet Placement Confirmation: ETT inserted through vocal cords under direct vision,  positive ETCO2 and breath sounds checked- equal and bilateral Secured at: 23 cm Tube secured with: Tape Dental Injury: Teeth and Oropharynx as per pre-operative assessment        

## 2015-08-16 NOTE — Op Note (Signed)
Pre-operative Diagnosis:recurrent sigmoid diverticulitis  Post-operative Diagnosis: same  Surgeon: [name]   Assistants: Dr Susa Griffins  Anesthesia: General endotracheal anesthesia  ASA Class: 2  Surgeon: [name], MD FACS  Anesthesia: Gen. with endotracheal tube  Assistant:Loflin  Procedure Details  The patient was seen again in the Holding Room. The benefits, complications, treatment options, and expected outcomes were discussed with the patient. The risks of bleeding, infection, recurrence of symptoms, failure to resolve symptoms,  bowel injury, any of which could require further surgery were reviewed with the patient.   The patient was taken to Operating Room, identified as Neil Mcintyre and the procedure verified.  A Time Out was held and the above information confirmed.  Prior to the induction of general anesthesia, antibiotic prophylaxis was administered. VTE prophylaxis was in place. General endotracheal anesthesia was then administered and tolerated well. After the induction, the abdomen was prepped with Chloraprep and draped in the sterile fashion. The patient was positioned in the low lithotomy position.  Prior to this procedure a cystoscopy with ureteral stent placement was obtained by urology which we documented in a separate op note.  The procedure began with the right upper quadrant Veress needle approach. The skin was localized with a 0.5% Marcaine with epinephrine solution. Incised with Lemley scalpel and a Veress needle was used to obtain a pneumoperitoneum to 15 mmHg. Then using a 5 mm Optiview trocar axis of the peritoneum was established. There is no evidence of damage to the deep returning structures from either the needle or the Optiview trocar. We then placed our trochars of an infraumbilical 5 mm a left lower quadrant 5 mm and a right lower quadrant under trocar all under direct visualization. An additional low midline 5 mm trocar was added  these were all localized prior to placement and without any difficulty.  The patient was then placed and a steep head down and rotated to the left which allowed [place] to visualize the sigmoid colon. Sigmoid colon was noted to have dense inflammatory adhesions to the anterior abdominal wall and pelvis. A meticulous and difficult dissection was undertaken to separate the colon from these inflammatory fashion. Using accommodation of blunt dissection, monopolar dissection, LigaSure bipolar dissection the tissue was eventually freed which allowed visualization of the entire sigmoid colon going to the pelvis.  With the freely mobile sigmoid colon an area proximal to the inflammation was chosen and a mesenteric window was created with the LigaSure device. The mesentery was then sequentially taken along the sigmoid colon down into the pelvis until we were distal to the area of inflammation. The colon mucosa was then skeletonized until it was able to accept a 60 mm blue load GIA stapler. A single fire was all that was required. The proximal mesentery was then visualized and an additional area was taken with the LigaSure device to allow adequate mobility for artery anastomosis.  The left lower quadrant incision was then enlarged and a small Alexis wound protector was placed and the abdomen through this incision site.the sigmoid colon was able to be removed through the wound retractor after the area had to be widened bluntly and sharply. With the sigmoid colon extracorporeal an area proximal to the the diseased colon was chosen skeletonized and excised with an additional fire of the blue load stapler. The pursestring device was brought up to our proximal staple line and using a 3-0 nylon a Keith needle a pursestring was placed and the staple line was sharply excised. Using  EEA sizers it was discovered that this portion of colon would accept a 33 EEA stapler.  The 33 green load EEA stapler brought to the field and the  anvil was placed into our proximal colon and secured with the created first string suture. This was then returned to the perineal cavity and a pneumoperitoneum was reestablished by clamping the wound protector with a Kelley clamp. My assistant then went to the exposed rectal cavity and using the EEA sizers was able to easily obtain access to our distal staple line. And the 35 EEA stapler was placed into the rectum with a visual and just above the previously previous staple line and the spike was extendedand mated with the anvil that had already been placed in the abdomen. The stapler was closed and fired with ease revealing 2 intact donuts. We then investigated our anastomosis under water with a sigmoidoscope showed no evidence of air leak.  We returned the patient to a flat position on the table in all her operative trochars removed under direct visualization after confirming no evidence of bowel leak or injury. The Ubaldo Glassing was removed and the fascia was closed with a running 2-0 Vicryl suture. The deeper tissues were then closed with interrupted 2-0 Vicryl and then the skin was approximated with a running 4-0 Monocryl. All of her laparoscopic trocar sites were closed with a subcuticular 4-0 Monocryl. The abdomen was cleaned with a wet-to-dry lap sponge and a sterile dressing of Mastisol, Steri-Strips, gauze and tape were placed over all of her operative sites.  Prior to awakening from general endotracheal anesthesia all our counts were correct and one of the 2 ureteral stents was removed. The patient tolerated procedure well, there were no immediate, complications. He was extubated in the operating room and transferred to the PACU in good condition.  Findings:dense inflammatory adhesions from recurrent diverticulitis   Estimated Blood Loss: 150 mL         Drains: none         Specimens: sigmoid colon          Complications: none                  Condition: good   [name], MD,  FACS

## 2015-08-16 NOTE — H&P (View-Only) (Signed)
  Surgical Consultation  07/19/2015  Neil Mcintyre is an 40 y.o. male.   Chief Complaint  Patient presents with  . Follow-up    Discuss Hemorrhoids, colectomy and Diverticulitis     HPI: 40 year old male returns to clinic for follow-up of diverticulitis. Patient has had multiple bouts of home oral antibiotics. He states that he has been pain free. He denies any fevers, chills, nausea, vomiting, diarrhea, constipation, chest pain, shortness of breath. Patient states this is been his fifth or sixth course of diverticulitis and he remainspetrified that it will reoccur. Of note patient's wife had delivery of her second child approximately one week ago. Patient's previous diverticulitis attacks have been complicated. He's had numerous admissions for antibiotics and on admission earlier this year he required angioembolization of bleeding and also had a recent clinic visit for external hemorrhoid disease. Patient has no complaints and is currently and has returned to his usual baseline state of health. He is interested today in scheduling his colon surgery.   Past Medical History  Diagnosis Date  . Hypertension   . Diverticulitis   . Herpes     Past Surgical History  Procedure Laterality Date  . Colonoscopy w/ polypectomy    . Laparoscopic abdominal exploration      Family History  Problem Relation Age of Onset  . Adopted: Yes  . Family history unknown: Yes    Social History:  reports that he quit smoking about 2 years ago. He has never used smokeless tobacco. He reports that he drinks about 0.6 oz of alcohol per week. He reports that he does not use illicit drugs.  Allergies: No Known Allergies  Medications reviewed.     ROS  a multipoint review of systems was completed, all pertinent positives and negatives are documented within the history of present illness and the remainder are negative.    BP 149/91 mmHg  Pulse 121  Temp(Src) 98.3 F (36.8 C) (Oral)  Wt 107.049 kg  (236 lb)  Physical Exam  Gen.: No acute distress  eyes: pupils are equal, round, reactive to light and accommodation Neck: Supple and nontender  lymph nodes: No evidence of cervical, clavicular, axillary lymphadenopathy Chest: Clear to auscultation Heart: Tachycardic but without obvious rubs or gallops  abdomen: Soft, nontender, nondistended. Large in girth but no evidence of palpable masses or tenderness on exam  GU exam deferred  extremities: Moves all surgeries well without any evidence of pitting edema   No results found for this or any previous visit (from the past 48 hour(s)). No results found.  Assessment/Plan: 1. Diverticulitis of large intestine with perforation without bleeding  40 year old male with multiple bouts of recurrent sigmoid diverticulitis. Discussed the procedure in detail to include the risks, benefits, alternatives. Patient is wife voiced understanding and wished to proceed with a laparoscopic sigmoid colectomy. Plan will be for operative intervention on her about 08/15/2015. He'll require anesthesia preop before this due to his current tachycardia and mild hypertension , also should he have a recurrence of his symptoms if surgery could either be pushed up or delayed. Patient voiced understanding and wishes to proceed.   [name], MD FACS General Surgeon dermatitis

## 2015-08-16 NOTE — Interval H&P Note (Signed)
History and Physical Interval Note:  08/16/2015 6:55 AM  Neil Mcintyre  has presented today for surgery, with the diagnosis of diverticulitis of large intestine  The various methods of treatment have been discussed with the patient and family. After consideration of risks, benefits and other options for treatment, the patient has consented to  Procedure(s): LAPAROSCOPIC SIGMOID COLECTOMY (N/A) CYSTOSCOPY WITH RETROGRADE PYELOGRAM (Bilateral) CYSTOSCOPY WITH STENT PLACEMENT (Bilateral) as a surgical intervention .  The patient's history has been reviewed, patient examined, no change in status, stable for surgery.  I have reviewed the patient's chart and labs.  Questions were answered to the patient's satisfaction.     [name]

## 2015-08-16 NOTE — Anesthesia Preprocedure Evaluation (Signed)
Anesthesia Evaluation  Patient identified by MRN, date of birth, ID band Patient awake    Reviewed: Allergy & Precautions, H&P , NPO status , Patient's Chart, lab work & pertinent test results  Airway Mallampati: II  TM Distance: >3 FB Neck ROM: full    Dental   Pulmonary former smoker,           Cardiovascular hypertension,      Neuro/Psych    GI/Hepatic   Endo/Other    Renal/GU      Musculoskeletal   Abdominal   Peds  Hematology   Anesthesia Other Findings   Reproductive/Obstetrics                             Anesthesia Physical Anesthesia Plan  ASA: III  Anesthesia Plan: General ETT   Post-op Pain Management:    Induction:   Airway Management Planned:   Additional Equipment:   Intra-op Plan:   Post-operative Plan:   Informed Consent: I have reviewed the patients History and Physical, chart, labs and discussed the procedure including the risks, benefits and alternatives for the proposed anesthesia with the patient or authorized representative who has indicated his/her understanding and acceptance.   Dental Advisory Given  Plan Discussed with:   Anesthesia Plan Comments:         Anesthesia Quick Evaluation

## 2015-08-16 NOTE — Op Note (Signed)
Date of procedure: 08/16/2015  Preoperative diagnosis:  1. Diverticulitis   Postoperative diagnosis:  1. Diverticulitis   Procedure: 1. Cystoscopy 2. Bilateral ureteral catheter placement  Surgeon: Baruch Gouty, MD  Anesthesia: General  Complications: None  Intraoperative findings: Successful placement of bilateral ureteral catheters  EBL: None  Specimens: None  Drains: Bilateral 5 French ureteral catheter and 16 French Foley catheter  Disposition: Stable to the postanesthesia care unit  Indication for procedure: The patient is a 40 y.o. male with diverticulitis she is undergoing a partial colectomy. At the request the general surgeon, I was asked to place bilateral ureteral catheters to help in ureteral identification.  After reviewing the management options for treatment, the patient elected to proceed with the above surgical procedure(s). We have discussed the potential benefits and risks of the procedure, side effects of the proposed treatment, the likelihood of the patient achieving the goals of the procedure, and any potential problems that might occur during the procedure or recuperation. Informed consent has been obtained.  Description of procedure: The patient was met in the preoperative area. All risks, benefits, and indications of the procedure were described in great detail. The patient consented to the procedure. Preoperative antibiotics were given. The patient was taken to the operative theater. General anesthesia was induced per the anesthesia service. The patient was then placed in the dorsal lithotomy position and prepped and draped in the usual sterile fashion. A preoperative timeout was called. A 21 French 30 cystoscope was inserted into the patient's bladder atraumatically per urethra. Pan cystoscopy was unremarkable. A sensor wire was placed in the left ureteral orifice to level the renal pelvis under fluoroscopy. A 5 French ureter catheter was placed up to level  renal pelvis on fluoroscopy and the sensor wire was removed. The catheter was comfortably in the renal pelvis on fluoroscopy. This process was repeated on the right side and identical fashion. The cystoscope was then withdrawn. A 60 French Foley catheter was then placed dependent drainage. The ureteral catheters secured to the catheter and also. The OR was then turned over to the general surgeon.  Plan: One ureteral catheter will be removed in the OR today at the end of the procedure. The second will be removed tomorrow by the general surgeon on the floor.  Baruch Gouty, M.D.

## 2015-08-16 NOTE — H&P (Signed)
@ENCDATE @ 7:19 AM   SHAJUAN DONE [birth year] [id number]  Referring provider: No referring provider defined for this encounter.  Diverticulitis  HPI: 40 yo male with diverticulitis who is scheduled to undergo a partial colectomy with need for ureteral catheter.     PMH: Past Medical History  Diagnosis Date  . Hypertension   . Diverticulitis   . Herpes   . Anxiety   . Anemia   . Restless leg syndrome     Surgical History: Past Surgical History  Procedure Laterality Date  . Colonoscopy w/ polypectomy    . Laparoscopic abdominal exploration  2016    [facility]  . Eye surgery      LASIK    Home Medications:    Medication List    ASK your doctor about these medications        amLODipine 5 MG tablet  Commonly known as:  NORVASC  Take 5 mg by mouth daily.     bisacodyl 5 MG EC tablet  Generic drug:  bisacodyl  Take 4 tablets at 8AM the day of your surgery.     buPROPion 150 MG 24 hr tablet  Commonly known as:  WELLBUTRIN XL  Take 150 mg by mouth daily.     erythromycin base 500 MG tablet  Commonly known as:  E-MYCIN  Take 1 tablet (500 mg total) by mouth 4 (four) times daily. Take 1000 MG three times the day before surgery (08/14/2015).     hydrocortisone 25 MG suppository  Commonly known as:  ANUSOL-HC  Place 1 suppository (25 mg total) rectally 2 (two) times daily.     LORazepam 0.5 MG tablet  Commonly known as:  ATIVAN  Take 0.5 mg by mouth daily as needed.     losartan-hydrochlorothiazide 100-25 MG tablet  Commonly known as:  HYZAAR  Take 1 tablet by mouth daily.     neomycin 500 MG tablet  Commonly known as:  MYCIFRADIN  Take 2 tablets (1,000 mg total) by mouth 3 (three) times daily. Take the day before surgery 08/14/2015.     Polyethylene Glycol Powd  Take 17 g by mouth daily. At 2:00 PM, start Miralax prep (255 gram bottle).     potassium chloride SA 20 MEQ tablet  Commonly known as:  K-DUR,KLOR-CON  Take 1 tablet (20 mEq total) by mouth 2  (two) times daily.     valACYclovir 500 MG tablet  Commonly known as:  VALTREX  Take 500 mg by mouth daily.        Allergies: No Known Allergies  Family History: Family History  Problem Relation Age of Onset  . Adopted: Yes  . Family history unknown: Yes    Social History:  reports that he quit smoking about 2 years ago. His smoking use included Cigarettes, Cigars, and Pipe. He has never used smokeless tobacco. He reports that he drinks about 0.6 oz of alcohol per week. He reports that he does not use illicit drugs.  ROS:                                        Physical Exam: BP 161/100 mmHg  Pulse 110  Temp(Src) 97.7 F (36.5 C) (Oral)  Resp 20  Ht 5\' 6"  (1.676 m)  Wt 237 lb (107.502 kg)  BMI 38.27 kg/m2  SpO2 99%  Constitutional:  Alert and oriented, No acute distress. HEENT: [place] AT, moist  mucus membranes.  Trachea midline, no masses. Cardiovascular: No clubbing, cyanosis, or edema. Respiratory: Normal respiratory effort, no increased work of breathing. GI: Abdomen is soft, nontender, nondistended, no abdominal masses GU: No CVA tenderness.  Skin: No rashes, bruises or suspicious lesions. Lymph: No cervical or inguinal adenopathy. Neurologic: Grossly intact, no focal deficits, moving all 4 extremities. Psychiatric: Normal mood and affect.  Laboratory Data: Lab Results  Component Value Date   WBC 8.6 08/08/2015   HGB 14.6 08/08/2015   HCT 44.6 08/08/2015   MCV 90.2 08/08/2015   PLT 277 08/08/2015    Lab Results  Component Value Date   CREATININE 0.87 08/08/2015    No results found for: PSA  No results found for: TESTOSTERONE  Lab Results  Component Value Date   HGBA1C 5.8 08/08/2015    Urinalysis    Component Value Date/Time   COLORURINE YELLOW* 05/28/2015 1957   COLORURINE Yellow 03/01/2014 1128   APPEARANCEUR CLEAR* 05/28/2015 1957   APPEARANCEUR Clear 03/01/2014 1128   LABSPEC 1.017 05/28/2015 1957   LABSPEC 1.016  03/01/2014 1128   PHURINE 6.0 05/28/2015 1957   PHURINE 7.0 03/01/2014 1128   GLUCOSEU NEGATIVE 05/28/2015 1957   GLUCOSEU Negative 03/01/2014 1128   HGBUR NEGATIVE 05/28/2015 1957   HGBUR Negative 03/01/2014 1128   BILIRUBINUR NEGATIVE 05/28/2015 1957   BILIRUBINUR Negative 03/01/2014 1128   KETONESUR NEGATIVE 05/28/2015 1957   KETONESUR Negative 03/01/2014 [place] 05/28/2015 1957   PROTEINUR Negative 03/01/2014 1128   NITRITE NEGATIVE 05/28/2015 1957   NITRITE Negative 03/01/2014 1128   LEUKOCYTESUR NEGATIVE 05/28/2015 1957   LEUKOCYTESUR Negative 03/01/2014 1128     Assessment & Plan:   1. Diverticulitis Cystoscopy, bilateral ureteral catheter placement  @DIAGMED @  No Follow-up on file.  [name], MD  [facility] [address] [place], [place] [address] [phone]

## 2015-08-17 DIAGNOSIS — K5732 Diverticulitis of large intestine without perforation or abscess without bleeding: Secondary | ICD-10-CM

## 2015-08-17 LAB — CBC
HCT: 40.9 % (ref 40.0–52.0)
Hemoglobin: 13.7 g/dL (ref 13.0–18.0)
MCH: 30.1 pg (ref 26.0–34.0)
MCHC: 33.6 g/dL (ref 32.0–36.0)
MCV: 89.7 fL (ref 80.0–100.0)
PLATELETS: 301 10*3/uL (ref 150–440)
RBC: 4.56 MIL/uL (ref 4.40–5.90)
RDW: 14.3 % (ref 11.5–14.5)
WBC: 15.8 10*3/uL — ABNORMAL HIGH (ref 3.8–10.6)

## 2015-08-17 LAB — PHOSPHORUS: Phosphorus: 3 mg/dL (ref 2.5–4.6)

## 2015-08-17 LAB — BASIC METABOLIC PANEL
Anion gap: 5 (ref 5–15)
BUN: 20 mg/dL (ref 6–20)
CHLORIDE: 105 mmol/L (ref 101–111)
CO2: 24 mmol/L (ref 22–32)
CREATININE: 1.53 mg/dL — AB (ref 0.61–1.24)
Calcium: 8.3 mg/dL — ABNORMAL LOW (ref 8.9–10.3)
GFR calc non Af Amer: 56 mL/min — ABNORMAL LOW (ref >60)
GLUCOSE: 146 mg/dL — AB (ref 65–99)
Potassium: 3.8 mmol/L (ref 3.5–5.1)
Sodium: 134 mmol/L — ABNORMAL LOW (ref 135–145)

## 2015-08-17 LAB — SURGICAL PATHOLOGY

## 2015-08-17 LAB — MAGNESIUM: Magnesium: 1.7 mg/dL (ref 1.7–2.4)

## 2015-08-17 NOTE — Progress Notes (Signed)
1 Day Post-Op   Subjective:  Patient is without complaints this morning. He has been tolerating his current diet and is not having any abdominal pain. He has ambulated throughout his room and then up to the chair.  Vital signs in last 24 hours: Temp:  [96.8 F (36 C)-99 F (37.2 C)] 98.2 F (36.8 C) (03/16 1243) Pulse Rate:  [91-113] 93 (03/16 1243) Resp:  [13-22] 16 (03/16 1243) BP: (136-175)/(81-105) 146/85 mmHg (03/16 1243) SpO2:  [94 %-99 %] 95 % (03/16 1243) Weight:  [107.457 kg (236 lb 14.4 oz)-111.403 kg (245 lb 9.6 oz)] 111.403 kg (245 lb 9.6 oz) (03/16 [id number]) Last BM Date: 08/16/15  Intake/Output from previous day: 03/15 0701 - 03/16 0700 In: 4371.5 [P.O.:360; I.V.:4011.5] Out: 1390 [Urine:1240; Blood:150]  Physical exam: Gen.: No acute distress Chest: Clear to auscultation Heart: Regular rate and rhythm GI: Abdomen soft, purple tender to palpation at incision sites, nondistended. Sterile dressings in place in the operating room without any evidence of drainage GU: Foley in place with ureteral stent  Lab Results:  CBC  Recent Labs  08/16/15 1647 08/17/15 0406  WBC 18.5* 15.8*  HGB 15.5 13.7  HCT 46.6 40.9  PLT 329 301   CMP     Component Value Date/Time   NA 134* 08/17/2015 0406   NA 136 03/01/2014 1026   K 3.8 08/17/2015 0406   K 3.8 03/01/2014 1026   CL 105 08/17/2015 0406   CL 104 03/01/2014 1026   CO2 24 08/17/2015 0406   CO2 25 03/01/2014 1026   GLUCOSE 146* 08/17/2015 0406   GLUCOSE 90 03/01/2014 1026   BUN 20 08/17/2015 0406   BUN 12 03/01/2014 1026   CREATININE 1.53* 08/17/2015 0406   CREATININE 0.65 03/01/2014 1026   CALCIUM 8.3* 08/17/2015 0406   CALCIUM 9.1 03/01/2014 1026   PROT 7.1 08/08/2015 0958   ALBUMIN 3.8 08/08/2015 0958   AST 23 08/08/2015 0958   ALT 26 08/08/2015 0958   ALKPHOS 57 08/08/2015 0958   BILITOT 0.8 08/08/2015 0958   GFRNONAA 56* 08/17/2015 0406   GFRNONAA >60 03/01/2014 1026   GFRAA >60 08/17/2015 0406   GFRAA >60 03/01/2014 1026   PT/INR No results for input(s): LABPROT, INR in the last 72 hours.  Studies/Results: Dg C-arm 1-60 Min-no Report  08/16/2015  CLINICAL DATA: surgery C-ARM 1-60 MINUTES Fluoroscopy was utilized by the requesting physician.  No radiographic interpretation.    Assessment/Plan: 40 year old male 1 day status post laparoscopic sigmoid colectomy. Last remaining ureteral stent removed this morning. Encourage ambulation. Await return of bowel function. Possible removal of Foley catheter later today, however may leave until tomorrow.   [name], MD FACS General Surgeon  08/17/2015

## 2015-08-17 NOTE — Progress Notes (Signed)
Foley catheter removed at 3:15 pm per Dr Adonis Huguenin.  Patient tolerated well. Will continue to monitor. Haynes Hoehn RN.

## 2015-08-18 LAB — BASIC METABOLIC PANEL
ANION GAP: 8 (ref 5–15)
BUN: 11 mg/dL (ref 6–20)
CALCIUM: 8.1 mg/dL — AB (ref 8.9–10.3)
CO2: 26 mmol/L (ref 22–32)
CREATININE: 0.91 mg/dL (ref 0.61–1.24)
Chloride: 96 mmol/L — ABNORMAL LOW (ref 101–111)
GFR calc Af Amer: >60 mL/min (ref >60)
GLUCOSE: 100 mg/dL — AB (ref 65–99)
Potassium: 3.1 mmol/L — ABNORMAL LOW (ref 3.5–5.1)
Sodium: 130 mmol/L — ABNORMAL LOW (ref 135–145)

## 2015-08-18 LAB — MAGNESIUM: Magnesium: 1.8 mg/dL (ref 1.7–2.4)

## 2015-08-18 LAB — CBC
HCT: 39.7 % — ABNORMAL LOW (ref 40.0–52.0)
Hemoglobin: 13.1 g/dL (ref 13.0–18.0)
MCH: 29.6 pg (ref 26.0–34.0)
MCHC: 33.1 g/dL (ref 32.0–36.0)
MCV: 89.3 fL (ref 80.0–100.0)
PLATELETS: 276 10*3/uL (ref 150–440)
RBC: 4.45 MIL/uL (ref 4.40–5.90)
RDW: 14.6 % — AB (ref 11.5–14.5)
WBC: 11.4 10*3/uL — AB (ref 3.8–10.6)

## 2015-08-18 LAB — PHOSPHORUS: Phosphorus: 2.7 mg/dL (ref 2.5–4.6)

## 2015-08-18 NOTE — Progress Notes (Signed)
2 Days Post-Op   Subjective:  Patient did well overnight. States he's had some pain from his operative incisions however pain medicines have helped. He has tolerated his current liquid diet and had 2 small bowel movements. He denies any nausea or vomiting.  Vital signs in last 24 hours: Temp:  [97.6 F (36.4 C)-98 F (36.7 C)] 97.6 F (36.4 C) (03/17 1300) Pulse Rate:  [79-91] 82 (03/17 1300) Resp:  [16-17] 16 (03/17 1300) BP: (140-158)/(76-92) 150/78 mmHg (03/17 1300) SpO2:  [95 %-98 %] 96 % (03/17 1300) Last BM Date: 08/18/15  Intake/Output from previous day: 03/16 0701 - 03/17 0700 In: 3506.6 [P.O.:1080; I.V.:2426.6] Out: 2675 [Urine:2675]  Physical exam: Gen.: No acute distress Chest: Clear to auscultation Heart: Regular rhythm GI: Abdomen soft, nondistended. Well approximated laparoscopic surgical sites that are appropriate tender to palpation. Steri-Strips are in place with minimal setting was drainage from his most lateral right lower quadrant incision. No evidence of erythema or peritonitis.  Lab Results:  CBC  Recent Labs  08/17/15 0406 08/18/15 0417  WBC 15.8* 11.4*  HGB 13.7 13.1  HCT 40.9 39.7*  PLT 301 276   CMP     Component Value Date/Time   NA 130* 08/18/2015 0417   NA 136 03/01/2014 1026   K 3.1* 08/18/2015 0417   K 3.8 03/01/2014 1026   CL 96* 08/18/2015 0417   CL 104 03/01/2014 1026   CO2 26 08/18/2015 0417   CO2 25 03/01/2014 1026   GLUCOSE 100* 08/18/2015 0417   GLUCOSE 90 03/01/2014 1026   BUN 11 08/18/2015 0417   BUN 12 03/01/2014 1026   CREATININE 0.91 08/18/2015 0417   CREATININE 0.65 03/01/2014 1026   CALCIUM 8.1* 08/18/2015 0417   CALCIUM 9.1 03/01/2014 1026   PROT 7.1 08/08/2015 0958   ALBUMIN 3.8 08/08/2015 0958   AST 23 08/08/2015 0958   ALT 26 08/08/2015 0958   ALKPHOS 57 08/08/2015 0958   BILITOT 0.8 08/08/2015 0958   GFRNONAA >60 08/18/2015 0417   GFRNONAA >60 03/01/2014 1026   GFRAA >60 08/18/2015 0417   GFRAA >60  03/01/2014 1026   PT/INR No results for input(s): LABPROT, INR in the last 72 hours.  Studies/Results: No results found.  Assessment/Plan: 40 year old male 2 days status post laparoscopic sigmoid colectomy. Doing well. We'll advance diet to regular and attempt to control patient on oral medications today. Possible discharge home tomorrow. Encourage ambulation, incentive spirometer usage.   [name], MD FACS General Surgeon  08/18/2015

## 2015-08-18 NOTE — Anesthesia Postprocedure Evaluation (Signed)
Anesthesia Post Note  Patient: Neil Mcintyre  Procedure(s) Performed: Procedure(s) (LRB): LAPAROSCOPIC SIGMOID COLECTOMY (N/A) CYSTOSCOPY WITH RETROGRADE PYELOGRAM (Bilateral) CYSTOSCOPY WITH STENT PLACEMENT (Bilateral)  Patient location during evaluation: PACU Anesthesia Type: General Level of consciousness: awake and alert Pain management: pain level controlled Vital Signs Assessment: post-procedure vital signs reviewed and stable Respiratory status: spontaneous breathing, nonlabored ventilation, respiratory function stable and patient connected to nasal cannula oxygen Cardiovascular status: blood pressure returned to baseline and stable Postop Assessment: no signs of nausea or vomiting Anesthetic complications: no    Last Vitals:  Filed Vitals:   08/18/15 0902 08/18/15 1300  BP: 158/92 150/78  Pulse: 91 82  Temp:  36.4 C  Resp:  16    Last Pain:  Filed Vitals:   08/18/15 1731  PainSc: 0-No pain                 Molli Barrows

## 2015-08-19 MED ORDER — HYDROCODONE-ACETAMINOPHEN 5-325 MG PO TABS: 1.0000 | ORAL_TABLET | ORAL | Status: DC | PRN

## 2015-08-19 NOTE — Final Progress Note (Signed)
3 Days Post-Op   Subjective:  Patient without complaints. Tolerating his diet, passing flatus, ambulating with ease.  Vital signs in last 24 hours: Temp:  [98 F (36.7 C)-98.4 F (36.9 C)] 98 F (36.7 C) (03/18 0448) Pulse Rate:  [77-88] 77 (03/18 0448) Resp:  [16] 16 (03/18 0448) BP: (135-155)/(78-86) 155/78 mmHg (03/18 0916) SpO2:  [94 %-95 %] 95 % (03/18 0448) Last BM Date: 08/19/15  Intake/Output from previous day: 03/17 0701 - 03/18 0700 In: 2382 [P.O.:1720; I.V.:662] Out: 2 [Urine:1; Stool:1]  GI: Abdomen soft, appropriately tender to palpation at incision sites, nondistended. Dressings are replaced without any evidence of erythema or drainage to any as laparoscopic incisions.  Lab Results:  CBC  Recent Labs  08/17/15 0406 08/18/15 0417  WBC 15.8* 11.4*  HGB 13.7 13.1  HCT 40.9 39.7*  PLT 301 276   CMP     Component Value Date/Time   NA 130* 08/18/2015 0417   NA 136 03/01/2014 1026   K 3.1* 08/18/2015 0417   K 3.8 03/01/2014 1026   CL 96* 08/18/2015 0417   CL 104 03/01/2014 1026   CO2 26 08/18/2015 0417   CO2 25 03/01/2014 1026   GLUCOSE 100* 08/18/2015 0417   GLUCOSE 90 03/01/2014 1026   BUN 11 08/18/2015 0417   BUN 12 03/01/2014 1026   CREATININE 0.91 08/18/2015 0417   CREATININE 0.65 03/01/2014 1026   CALCIUM 8.1* 08/18/2015 0417   CALCIUM 9.1 03/01/2014 1026   PROT 7.1 08/08/2015 0958   ALBUMIN 3.8 08/08/2015 0958   AST 23 08/08/2015 0958   ALT 26 08/08/2015 0958   ALKPHOS 57 08/08/2015 0958   BILITOT 0.8 08/08/2015 0958   GFRNONAA >60 08/18/2015 0417   GFRNONAA >60 03/01/2014 1026   GFRAA >60 08/18/2015 0417   GFRAA >60 03/01/2014 1026   PT/INR No results for input(s): LABPROT, INR in the last 72 hours.  Studies/Results: No results found.  Assessment/Plan: 40 year old male who days status post laparoscopic sigmoid colectomy for recurrent diverticulitis. Doing well. We'll discharge home and he will follow-up in clinic in approximately  2 weeks.   [name], MD FACS General Surgeon  08/19/2015

## 2015-08-19 NOTE — Progress Notes (Signed)
3 Days Post-Op   Subjective:  Patient reports feeling well this morning did have some pain control issues overnight and required a sickle dose of IV pain medications but otherwise well controlled on oral medications. Tolerating his regular diet and passing flatus. Has not had a bowel movement since starting regular diet.  Vital signs in last 24 hours: Temp:  [97.6 F (36.4 C)-98.4 F (36.9 C)] 98 F (36.7 C) (03/18 0448) Pulse Rate:  [77-88] 77 (03/18 0448) Resp:  [16] 16 (03/18 0448) BP: (135-155)/(78-86) 155/78 mmHg (03/18 0916) SpO2:  [94 %-96 %] 95 % (03/18 0448) Last BM Date: 08/19/15  Intake/Output from previous day: 03/17 0701 - 03/18 0700 In: 2382 [P.O.:1720; I.V.:662] Out: 2 [Urine:1; Stool:1]  GI: Abdomen is soft, appropriately tender to palpation his incision sites, nondistended. Laparoscopic incisions well approximated without evidence of erythema or purulent drainage. Minimal serous drainage from his right lower quadrant site.  Lab Results:  CBC  Recent Labs  08/17/15 0406 08/18/15 0417  WBC 15.8* 11.4*  HGB 13.7 13.1  HCT 40.9 39.7*  PLT 301 276   CMP     Component Value Date/Time   NA 130* 08/18/2015 0417   NA 136 03/01/2014 1026   K 3.1* 08/18/2015 0417   K 3.8 03/01/2014 1026   CL 96* 08/18/2015 0417   CL 104 03/01/2014 1026   CO2 26 08/18/2015 0417   CO2 25 03/01/2014 1026   GLUCOSE 100* 08/18/2015 0417   GLUCOSE 90 03/01/2014 1026   BUN 11 08/18/2015 0417   BUN 12 03/01/2014 1026   CREATININE 0.91 08/18/2015 0417   CREATININE 0.65 03/01/2014 1026   CALCIUM 8.1* 08/18/2015 0417   CALCIUM 9.1 03/01/2014 1026   PROT 7.1 08/08/2015 0958   ALBUMIN 3.8 08/08/2015 0958   AST 23 08/08/2015 0958   ALT 26 08/08/2015 0958   ALKPHOS 57 08/08/2015 0958   BILITOT 0.8 08/08/2015 0958   GFRNONAA >60 08/18/2015 0417   GFRNONAA >60 03/01/2014 1026   GFRAA >60 08/18/2015 0417   GFRAA >60 03/01/2014 1026   PT/INR No results for input(s): LABPROT, INR  in the last 72 hours.  Studies/Results: No results found.  Assessment/Plan: 40 year old male 3 days status post laparoscopic sigmoid colectomy. Doing well. Encourage ambulation, incentive spirometer usage, oral intake. Likely discharge home later today.   [name], MD FACS General Surgeon  08/19/2015

## 2015-08-19 NOTE — Discharge Summary (Signed)
Patient ID: Neil Mcintyre MRN: [id number] DOB/AGE: [birth year] 39 y.o.  Admit date: 08/16/2015 Discharge date: 08/19/2015  Discharge Diagnoses:  Recurrent sigmoid diverticulitis  Procedures Performed: Laparoscopic sigmoid colectomy  Discharged Condition: good  Hospital Course: Patient underwent an elective laparoscopic sigmoid colectomy. Had an uneventful postoperative course. He was able tolerate a diet, having bowel function, pain controlled with oral medications prior to discharge home.  Discharge Orders:  discharge home  Disposition: 01-Home or Self Care  Discharge Medications:    Medication List    STOP taking these medications        erythromycin base 500 MG tablet  Commonly known as:  E-MYCIN     neomycin 500 MG tablet  Commonly known as:  MYCIFRADIN      TAKE these medications        amLODipine 5 MG tablet  Commonly known as:  NORVASC  Take 5 mg by mouth daily.     bisacodyl 5 MG EC tablet  Generic drug:  bisacodyl  Take 4 tablets at 8AM the day of your surgery.     buPROPion 150 MG 24 hr tablet  Commonly known as:  WELLBUTRIN XL  Take 150 mg by mouth daily.     HYDROcodone-acetaminophen 5-325 MG tablet  Commonly known as:  NORCO/VICODIN  Take 1-2 tablets by mouth every 4 (four) hours as needed for moderate pain.     hydrocortisone 25 MG suppository  Commonly known as:  ANUSOL-HC  Place 1 suppository (25 mg total) rectally 2 (two) times daily.     LORazepam 0.5 MG tablet  Commonly known as:  ATIVAN  Take 0.5 mg by mouth daily as needed.     losartan-hydrochlorothiazide 100-25 MG tablet  Commonly known as:  HYZAAR  Take 1 tablet by mouth daily.     Polyethylene Glycol Powd  Take 17 g by mouth daily. At 2:00 PM, start Miralax prep (255 gram bottle).     potassium chloride SA 20 MEQ tablet  Commonly known as:  K-DUR,KLOR-CON  Take 1 tablet (20 mEq total) by mouth 2 (two) times daily.     valACYclovir 500 MG tablet  Commonly known as:   VALTREX  Take 500 mg by mouth daily.         Follwup: Follow-up Information    Follow up with [facility]. Schedule an appointment as soon as possible for a visit in 2 weeks.   Specialty:  General Surgery   Why:  postop   Contact information:   [address] [place]      Signed: [name] 08/19/2015, 1:09 PM

## 2015-08-19 NOTE — Progress Notes (Signed)
Pt ambulated in hall and had an additional bowel movement this morning, tolerating diet well. Dischaged to home. Concerns addressed. Prescription and discharge summary given to pt. IV site removed.

## 2015-08-19 NOTE — Discharge Instructions (Signed)
Laparoscopic Colectomy, Care After °Refer to this sheet in the next few weeks. These instructions provide you with information on caring for yourself after your procedure. Your health care provider may also give you more specific instructions. Your treatment has been planned according to current medical practices, but problems sometimes occur. Call your health care provider if you have any problems or questions after your procedure. °WHAT TO EXPECT AFTER THE PROCEDURE °After your procedure, it is typical to have the following: °· Pain in your abdomen, especially at the incision sites. You will be given pain medicine to control the pain. °· Tiredness. This is a normal part of the recovery process. Your energy level will return to normal over the next several weeks. °· Constipation. You may be given stool softeners to prevent this. °HOME CARE INSTRUCTIONS  °· Only take over-the-counter or prescription medicines as directed by your health care provider. °· Ask your health care provider whether you may take a shower when you go home. °· Remove or change any bandages (dressings) as directed. °· You may resume a normal diet and activities as directed. Eat plenty of fruits and vegetables to help prevent constipation. °· Drink enough fluids to keep your urine clear or pale yellow. This also helps prevent constipation. °· Take rest breaks during the day as needed. °· Avoid lifting anything heavier than 25 pounds (11.3 kg) or driving for 4 weeks or until your health care provider says it is okay. °· Follow up with your health care provider as directed. Ask your health care provider when to make an appointment to get your stitches or staples removed. °SEEK MEDICAL CARE IF:  °· You have increased bleeding from the incision areas. °· You have redness, swelling, or increasing pain in the wounds. °· You see pus coming from a wound. °· You have a fever. °· You notice a foul smell coming from the wound or dressing. °· Your wound is  breaking open (edges not staying together) after sutures or staples have been removed. °SEEK IMMEDIATE MEDICAL CARE IF: °· You develop a rash. °· You have chest pain or difficulty breathing. °· You have pain or swelling in your legs. °· You have lightheadedness or feel faint. °· Your abdomen becomes larger (distended). °· You have nausea or vomiting. °· You have blood in your stools. °  °This information is not intended to replace advice given to you by your health care provider. Make sure you discuss any questions you have with your health care provider. °  °Document Released: 12/07/2004 Document Revised: 03/10/2013 Document Reviewed: 12/30/2012 °Elsevier Interactive Patient Education ©2016 [employer] ° °

## 2015-08-21 ENCOUNTER — Telehealth: Payer: Self-pay | Admitting: General Surgery

## 2015-08-21 NOTE — Telephone Encounter (Signed)
Called patient back in reference to his concern. Patient stated that he noticed a bruise on his left thigh that wasn't there after surgery. I asked him if he had noticed any other bruising and he stated that he didn't. I told him that it was normal for him to notice bruising after surgery and that through time the color of it will change. I told him not to be scared since this is normal. Patient agreed.  I told patient that if he had questions or concerns, to call us back.

## 2015-08-21 NOTE — Telephone Encounter (Signed)
Noticed bruises on left side on his hip bone. No pain but tender. Please call

## 2015-08-26 ENCOUNTER — Telehealth: Payer: Self-pay | Admitting: Surgery

## 2015-08-26 NOTE — Telephone Encounter (Signed)
See note about hemorrhoids. Recommended agiainst suppositories given his recent surgery. instructions given about medical Rx.

## 2015-09-04 ENCOUNTER — Encounter: Payer: Self-pay | Admitting: General Surgery

## 2015-09-07 ENCOUNTER — Encounter: Payer: Self-pay | Admitting: General Surgery

## 2015-09-07 ENCOUNTER — Ambulatory Visit (INDEPENDENT_AMBULATORY_CARE_PROVIDER_SITE_OTHER): Payer: 59 | Admitting: General Surgery

## 2015-09-07 VITALS — BP 139/82 | HR 86 | Temp 97.3°F | Ht 66.0 in | Wt 239.0 lb

## 2015-09-07 DIAGNOSIS — Z4889 Encounter for other specified surgical aftercare: Secondary | ICD-10-CM

## 2015-09-07 MED ORDER — DOCUSATE SODIUM 100 MG PO CAPS: 100.0000 mg | ORAL_CAPSULE | Freq: Two times a day (BID) | ORAL | Status: DC

## 2015-09-07 MED ORDER — POLYETHYLENE GLYCOL POWD: 17.0000 g | Freq: Every day | Status: DC

## 2015-09-07 NOTE — Progress Notes (Signed)
Outpatient Surgical Follow Up  09/07/2015  Neil Mcintyre is an 40 y.o. male.   Chief Complaint  Patient presents with  . Routine Post Op    Laparoscopic Sigmoid Colectomy (08/16/15)- Dr. Adonis Huguenin    HPI: 40 year old male returns to clinic 3 weeks status post laparoscopic sigmoid colectomy for recurrent diverticulitis. Patient reports doing well. He is eating well, having no nausea, vomiting, chest pain, shortness of breath, diarrhea. He is continued to have occasional hard stools. He is not doing anything for this. His only complaints are of a burning sensation around his infraumbilical and suprapubic laparoscopic incisions. Otherwise he has been without pain medications for many days and desires to return to driving and activities.  Past Medical History  Diagnosis Date  . Hypertension   . Diverticulitis   . Herpes   . Anxiety   . Anemia   . Restless leg syndrome     Past Surgical History  Procedure Laterality Date  . Colonoscopy w/ polypectomy    . Laparoscopic abdominal exploration  2016    [facility]  . Eye surgery      LASIK  . Laparoscopic sigmoid colectomy N/A 08/16/2015    Procedure: LAPAROSCOPIC SIGMOID COLECTOMY;  Surgeon: [name], MD;  Location: [facility] ORS;  Service: General;  Laterality: N/A;  . Cystoscopy w/ retrogrades Bilateral 08/16/2015    Procedure: CYSTOSCOPY WITH RETROGRADE PYELOGRAM;  Surgeon: [name], MD;  Location: [facility] ORS;  Service: Urology;  Laterality: Bilateral;  . Cystoscopy with stent placement Bilateral 08/16/2015    Procedure: CYSTOSCOPY WITH STENT PLACEMENT;  Surgeon: [name], MD;  Location: [facility] ORS;  Service: Urology;  Laterality: Bilateral;    Family History  Problem Relation Age of Onset  . Adopted: Yes  . Family history unknown: Yes    Social History:  reports that he quit smoking about 2 years ago. His smoking use included Cigarettes, Cigars, and Pipe. He has never used smokeless tobacco. He reports that he drinks  about 0.6 oz of alcohol per week. He reports that he does not use illicit drugs.  Allergies: No Known Allergies  Medications reviewed.    ROS A multipoint review of systems was completed, all pertinent positives and negatives are documented in the history of present illness and remainder are negative.   BP 139/82 mmHg  Pulse 86  Temp(Src) 97.3 F (36.3 C) (Oral)  Ht 5\' 6"  (1.676 m)  Wt 108.41 kg (239 lb)  BMI 38.59 kg/m2  Physical Exam Gen.: No acute distress Chest: Clear Heart: Regular rhythm Abdomen: Soft, nontender, nondistended. Well approximately lap scopic incisions without evidence of erythema or drainage. Single spitting Monocryl stitch removed from the lower suprapubic incision site.    No results found for this or any previous visit (from the past 48 hour(s)). No results found.  Assessment/Plan:  1. Aftercare following surgery 40 year old male 3 weeks status post sigmoid colectomy. Doing well. Discussed signs and symptoms of infection or herniation and return to clinic medially should there occur. Also discussed appropriate time line for returning to normal activities of which patient voiced understanding. Otherwise patient will follow up on an as-needed basis.     [name], MD FACS General Surgeon  09/07/2015,10:35 AM

## 2015-09-07 NOTE — Patient Instructions (Signed)
You may return to work without restrictions in 2 weeks if you are pain free.  Please call our office with any questions or concerns.  Please do not submerge in a tub, hot tub, or pool until incisions are completely sealed.  Use sun block to incision area over the next year if this area will be exposed to sun. This helps decrease scarring.  You may now resume your normal activities. Listen to your body when lifting, if you have pain when lifting, stop and then try again in a few days.  If you develop redness, drainage, or pain at incision sites- call our office immediately and speak with a nurse.

## 2015-09-20 ENCOUNTER — Telehealth: Payer: Self-pay

## 2015-09-20 ENCOUNTER — Telehealth: Payer: Self-pay | Admitting: General Surgery

## 2015-09-20 NOTE — Telephone Encounter (Signed)
Called patient to let him know that his letter was written and left at the front desk.

## 2015-09-20 NOTE — Telephone Encounter (Signed)
Patient called and stated he needed a return to work note. He is a Museum/gallery conservator. He thought Dr. Adonis Huguenin told him the lifting restrictions would be until 4/24. He would also like to pick it up in [place].

## 2015-09-20 NOTE — Telephone Encounter (Signed)
error 

## 2016-07-03 ENCOUNTER — Encounter: Payer: Self-pay | Admitting: Emergency Medicine

## 2016-07-03 ENCOUNTER — Emergency Department
Admission: EM | Admit: 2016-07-03 | Discharge: 2016-07-03 | Disposition: A | Discharge status: Home / Self Care | Payer: 59 | Attending: Emergency Medicine | Admitting: Emergency Medicine

## 2016-07-03 DIAGNOSIS — Z87891 Personal history of nicotine dependence: Secondary | ICD-10-CM | POA: Insufficient documentation

## 2016-07-03 DIAGNOSIS — R110 Nausea: Secondary | ICD-10-CM | POA: Insufficient documentation

## 2016-07-03 DIAGNOSIS — I10 Essential (primary) hypertension: Secondary | ICD-10-CM | POA: Diagnosis not present

## 2016-07-03 DIAGNOSIS — R109 Unspecified abdominal pain: Secondary | ICD-10-CM | POA: Diagnosis present

## 2016-07-03 DIAGNOSIS — R197 Diarrhea, unspecified: Secondary | ICD-10-CM | POA: Diagnosis not present

## 2016-07-03 DIAGNOSIS — Z79899 Other long term (current) drug therapy: Secondary | ICD-10-CM | POA: Insufficient documentation

## 2016-07-03 LAB — COMPREHENSIVE METABOLIC PANEL
ALT: 26 U/L (ref 17–63)
AST: 22 U/L (ref 15–41)
Albumin: 4 g/dL (ref 3.5–5.0)
Alkaline Phosphatase: 72 U/L (ref 38–126)
Anion gap: 7 (ref 5–15)
BILIRUBIN TOTAL: 0.7 mg/dL (ref 0.3–1.2)
BUN: 15 mg/dL (ref 6–20)
CHLORIDE: 103 mmol/L (ref 101–111)
CO2: 25 mmol/L (ref 22–32)
CREATININE: 0.92 mg/dL (ref 0.61–1.24)
Calcium: 8.4 mg/dL — ABNORMAL LOW (ref 8.9–10.3)
Glucose, Bld: 98 mg/dL (ref 65–99)
Potassium: 3.5 mmol/L (ref 3.5–5.1)
Sodium: 135 mmol/L (ref 135–145)
TOTAL PROTEIN: 7.6 g/dL (ref 6.5–8.1)

## 2016-07-03 LAB — CBC
HCT: 48.3 % (ref 40.0–52.0)
Hemoglobin: 15.9 g/dL (ref 13.0–18.0)
MCH: 29.3 pg (ref 26.0–34.0)
MCHC: 32.9 g/dL (ref 32.0–36.0)
MCV: 88.9 fL (ref 80.0–100.0)
PLATELETS: 283 10*3/uL (ref 150–440)
RBC: 5.43 MIL/uL (ref 4.40–5.90)
RDW: 15.1 % — AB (ref 11.5–14.5)
WBC: 9.8 10*3/uL (ref 3.8–10.6)

## 2016-07-03 LAB — LIPASE, BLOOD: LIPASE: 42 U/L (ref 11–51)

## 2016-07-03 MED ORDER — ONDANSETRON 4 MG PO TBDP: ORAL_TABLET | ORAL | 0 refills | Status: DC

## 2016-07-03 NOTE — ED Triage Notes (Signed)
Pt to ED via POV with c/o abd pain with nausea and diarrhea since last night. Pt states aprox 2 episodes in the past 24hrs.  Pt c/o body aches,  denies any fevers. Pt A&Ox4, VS stable

## 2016-07-03 NOTE — ED Notes (Signed)
Pt c/o diarrhea and body aches  x2 the past 24hrs. Pt A&Ox4. Denies any fevers

## 2016-07-03 NOTE — ED Provider Notes (Signed)
[facility] Emergency Department Provider Note  ____________________________________________   First MD Initiated Contact with Patient 07/03/16 1524     (approximate)  I have reviewed the triage vital signs and the nursing notes.   HISTORY  Chief Complaint Diarrhea and Abdominal Pain    HPI Neil Mcintyre is a 41 y.o. 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  The symptoms started relatively acutely over the last 24 hours or so.  He describes symptoms as severe although he has had only the 3 episodes of loose stools.  He describes the abdominal pain as being a feeling of fullness, cramping, and "rumbling".  He has had severe nausea but does not tend to vomit even when he is very sick to his stomach and states he cannot remember the last time he vomited.  He denies fever/chills, chest pain, shortness of breath, dysuria, myalgias, URI symptoms. Nothing makes his symptoms better nor worse.   Past Medical History:  Diagnosis Date  . Anemia   . Anxiety   . Diverticulitis   . Herpes   . Hypertension   . Restless leg syndrome     Patient Active Problem List   Diagnosis Date Noted  . Diverticulitis 08/16/2015  . BRBPR (bright red blood per rectum) 05/29/2015  . Diverticulitis of large intestine with perforation with bleeding   . Diverticulitis large intestine 05/09/2015  . Anxiety 02/21/2015  . Benign essential HTN 02/21/2015  . Other shoulder lesions, unspecified shoulder 11/12/2013  . Bursitis of shoulder 11/12/2013    Past Surgical History:  Procedure Laterality Date  . COLONOSCOPY W/ POLYPECTOMY    . CYSTOSCOPY W/ RETROGRADES Bilateral 08/16/2015   Procedure: CYSTOSCOPY WITH RETROGRADE PYELOGRAM;  Surgeon: [name], MD;  Location: [facility] ORS;  Service: Urology;  Laterality:  Bilateral;  . CYSTOSCOPY WITH STENT PLACEMENT Bilateral 08/16/2015   Procedure: CYSTOSCOPY WITH STENT PLACEMENT;  Surgeon: [name], MD;  Location: [facility] ORS;  Service: Urology;  Laterality: Bilateral;  . EYE SURGERY     LASIK  . LAPAROSCOPIC ABDOMINAL EXPLORATION  2016   [facility]  . LAPAROSCOPIC SIGMOID COLECTOMY N/A 08/16/2015   Procedure: LAPAROSCOPIC SIGMOID COLECTOMY;  Surgeon: [name], MD;  Location: [facility] ORS;  Service: General;  Laterality: N/A;    Prior to Admission medications   Medication Sig Start Date End Date Taking? Authorizing Provider  amLODipine (NORVASC) 5 MG tablet Take 5 mg by mouth daily.  02/13/15 02/13/16  Historical Provider, MD  buPROPion (WELLBUTRIN XL) 150 MG 24 hr tablet Take 150 mg by mouth daily.  02/16/15   Historical Provider, MD  docusate sodium (COLACE) 100 MG capsule Take 1 capsule (100 mg total) by mouth 2 (two) times daily. 09/07/15   [name], MD  HYDROcodone-acetaminophen (NORCO/VICODIN) 5-325 MG tablet Take 1-2 tablets by mouth every 4 (four) hours as needed for moderate pain. Patient not taking: Reported on 09/07/2015 08/19/15   [name], MD  LORazepam (ATIVAN) 0.5 MG tablet Take 0.5 mg by mouth daily as needed.  04/07/15   Historical Provider, MD  losartan-hydrochlorothiazide (HYZAAR) 100-25 MG tablet Take 1 tablet by mouth daily. 04/17/15   Historical Provider, MD  ondansetron (ZOFRAN ODT) 4 MG disintegrating tablet Allow 1-2 tablets to dissolve in your mouth every 8 hours as needed for nausea/vomiting 07/03/16   Hinda Kehr, MD  Polyethylene Glycol POWD  Take 17 g by mouth daily. 09/07/15   [name], MD  valACYclovir (VALTREX) 500 MG tablet Take 500 mg by mouth daily.  04/10/15   Historical Provider, MD    Allergies Patient has no known allergies.  Family History  Problem Relation Age of Onset  . Adopted: Yes  . Family history unknown: Yes    Social History Social History  Substance Use Topics  . Smoking status:  Former Smoker    Types: Cigarettes, Cigars, Pipe    Quit date: 05/23/2013  . Smokeless tobacco: Never Used  . Alcohol use 0.6 oz/week    1 Cans of beer per week     Comment: socially    Review of Systems Constitutional: No fever/chills Eyes: No visual changes. ENT: No sore throat. Cardiovascular: Denies chest pain. Respiratory: Denies shortness of breath. Gastrointestinal: No abdominal pain.  No nausea, no vomiting.  No diarrhea.  No constipation. Genitourinary: Negative for dysuria. Musculoskeletal: Negative for back pain. Skin: Negative for rash. Neurological: Negative for headaches, focal weakness or numbness.  10-point ROS otherwise negative.  ____________________________________________   PHYSICAL EXAM:  VITAL SIGNS: ED Triage Vitals  Enc Vitals Group     BP 07/03/16 1332 140/89     Pulse Rate 07/03/16 1332 97     Resp 07/03/16 1332 16     Temp 07/03/16 1332 98.4 F (36.9 C)     Temp Source 07/03/16 1332 Oral     SpO2 07/03/16 1332 99 %     Weight 07/03/16 1333 238 lb (108 kg)     Height 07/03/16 1333 5\' 6"  (1.676 m)     Head Circumference --      Peak Flow --      Pain Score 07/03/16 1333 4     Pain Loc --      Pain Edu? --      Excl. in [place]? --     Constitutional: Alert and oriented. Well appearing and in no acute distress. Eyes: Conjunctivae are normal. PERRL. EOMI. Head: Atraumatic. Nose: No congestion/rhinnorhea. Mouth/Throat: Mucous membranes are moist.  Oropharynx non-erythematous. Neck: No stridor.  No meningeal signs.   Cardiovascular: Normal rate, regular rhythm. Good peripheral circulation. Grossly normal heart sounds. Respiratory: Normal respiratory effort.  No retractions. Lungs CTAB. Gastrointestinal: Soft and nontender. Possibly some distention. Musculoskeletal: No lower extremity tenderness nor edema. No gross deformities of extremities. Neurologic:  Normal speech and language. No gross focal neurologic deficits are appreciated.  Skin:   Skin is warm, dry and intact. No rash noted. Psychiatric: Mood and affect are normal. Speech and behavior are normal.  ____________________________________________   LABS (all labs ordered are listed, but only abnormal results are displayed)  Labs Reviewed  COMPREHENSIVE METABOLIC PANEL - Abnormal; Notable for the following:       Result Value   Calcium 8.4 (*)    All other components within normal limits  CBC - Abnormal; Notable for the following:    RDW 15.1 (*)    All other components within normal limits  LIPASE, BLOOD  URINALYSIS, COMPLETE (UACMP) WITH MICROSCOPIC   ____________________________________________  EKG  None - EKG not ordered by ED physician ____________________________________________  RADIOLOGY   No results found.  ____________________________________________   PROCEDURES  Procedure(s) performed:   Procedures   Critical Care performed: No ____________________________________________   INITIAL IMPRESSION / ASSESSMENT AND PLAN / ED COURSE  Pertinent labs & imaging results that were available during my care of the patient were reviewed by me and considered  in my medical decision making (see chart for details).  The patient is well-appearing and in no acute distress with normal vital signs including being afebrile.  His labs are all unremarkable.  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  I discussed with him the plan for symptomatic treatment and have my usual customary recommendations and return precautions.  He understands and agrees with the plan.  He has no abdominal tenderness to palpation which is reassuring and there is no indication for imaging.      ____________________________________________  FINAL CLINICAL IMPRESSION(S) / ED DIAGNOSES  Final diagnoses:  Diarrhea of presumed infectious origin  Nausea     MEDICATIONS GIVEN DURING THIS  VISIT:  Medications - No data to display   NEW OUTPATIENT MEDICATIONS STARTED DURING THIS VISIT:  New Prescriptions   ONDANSETRON (ZOFRAN ODT) 4 MG DISINTEGRATING TABLET    Allow 1-2 tablets to dissolve in your mouth every 8 hours as needed for nausea/vomiting    Modified Medications   No medications on file    Discontinued Medications   No medications on file     Note:  This document was prepared using Dragon voice recognition software and may include unintentional dictation errors.    Hinda Kehr, MD 07/03/16 [phone]

## 2016-07-03 NOTE — Discharge Instructions (Signed)

## 2016-07-06 ENCOUNTER — Emergency Department
Admission: EM | Admit: 2016-07-06 | Discharge: 2016-07-07 | Disposition: A | Discharge status: Home / Self Care | Payer: 59 | Attending: Emergency Medicine | Admitting: Emergency Medicine

## 2016-07-06 DIAGNOSIS — Z5321 Procedure and treatment not carried out due to patient leaving prior to being seen by health care provider: Secondary | ICD-10-CM | POA: Diagnosis not present

## 2016-07-06 DIAGNOSIS — I10 Essential (primary) hypertension: Secondary | ICD-10-CM | POA: Insufficient documentation

## 2016-07-06 DIAGNOSIS — Z79899 Other long term (current) drug therapy: Secondary | ICD-10-CM | POA: Diagnosis not present

## 2016-07-06 DIAGNOSIS — Z7721 Contact with and (suspected) exposure to potentially hazardous body fluids: Secondary | ICD-10-CM | POA: Diagnosis not present

## 2016-07-06 DIAGNOSIS — Z87891 Personal history of nicotine dependence: Secondary | ICD-10-CM | POA: Insufficient documentation

## 2016-07-06 NOTE — ED Notes (Signed)
WC completed and delivered to lab for courier p/u. 

## 2016-07-29 ENCOUNTER — Ambulatory Visit (INDEPENDENT_AMBULATORY_CARE_PROVIDER_SITE_OTHER): Payer: 59 | Admitting: Family Medicine

## 2016-07-29 ENCOUNTER — Encounter: Payer: Self-pay | Admitting: Family Medicine

## 2016-07-29 VITALS — BP 134/80 | HR 104 | Temp 98.3°F | Ht 67.0 in | Wt 235.5 lb

## 2016-07-29 DIAGNOSIS — K5721 Diverticulitis of large intestine with perforation and abscess with bleeding: Secondary | ICD-10-CM

## 2016-07-29 DIAGNOSIS — E669 Obesity, unspecified: Secondary | ICD-10-CM | POA: Diagnosis not present

## 2016-07-29 DIAGNOSIS — I10 Essential (primary) hypertension: Secondary | ICD-10-CM

## 2016-07-29 DIAGNOSIS — F419 Anxiety disorder, unspecified: Secondary | ICD-10-CM | POA: Diagnosis not present

## 2016-07-29 DIAGNOSIS — IMO0001 Reserved for inherently not codable concepts without codable children: Secondary | ICD-10-CM

## 2016-07-29 NOTE — Assessment & Plan Note (Signed)
S/p hemicolectomy 2017 - no recurrence since.

## 2016-07-29 NOTE — Assessment & Plan Note (Signed)
Chronic, stable. Continue current regimen. Pt declines med refills at this time.

## 2016-07-29 NOTE — Patient Instructions (Addendum)
Good to meet you today, call us with questions. Check on last tetanus shot.  Continue current medications.  Return as needed or in 2-3 months for physical.

## 2016-07-29 NOTE — Progress Notes (Signed)
Pre visit review using our clinic review tool, if applicable. No additional management support is needed unless otherwise documented below in the visit note. 

## 2016-07-29 NOTE — Progress Notes (Signed)
BP 134/80   Pulse (!) 104   Temp 98.3 F (36.8 C) (Oral)   Ht 5\' 7"  (1.702 m)   Wt 235 lb 8 oz (106.8 kg)   BMI 36.88 kg/m    CC: new pt to establish care Subjective:    Patient ID: Neil Mcintyre, male    DOB: July 02, 1975, 41 y.o.   MRN: [id number]  HPI: Neil Mcintyre is a 41 y.o. male presenting on 07/29/2016 for Establish Care   Bad experience with prior MD.   H/o diverticulitis s/p sigmoid colectomy.   Anxiety - on wellbutrin XL 150mg  daily and lorazepam 0.5mg  QD PRN. Previously prescribed by psychiatrist. Takes lorazepam mainly for sleeping. This was prescribed by prior PCP.   HTN - Compliant with current antihypertensive regimen of amlodipine 5mg  daily, hyzaar 100/25mg  daily. Does not check blood pressures at home. No low blood pressure readings or symptoms of dizziness/syncope. Denies HA, vision changes, CP/tightness, SOB, leg swelling.   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 Interim medical history since our last visit reviewed. Allergies and medications reviewed and updated. Outpatient Medications Prior to Visit  Medication Sig Dispense Refill  . amLODipine (NORVASC) 5 MG tablet Take 5 mg by mouth daily.     Marland Kitchen buPROPion (WELLBUTRIN XL) 150 MG 24 hr tablet Take 150 mg by mouth daily.     Marland Kitchen LORazepam (ATIVAN) 0.5 MG tablet Take 0.5 mg by mouth daily as needed.     Marland Kitchen losartan-hydrochlorothiazide (HYZAAR) 100-25 MG tablet Take 1 tablet by mouth daily.  2  . valACYclovir (VALTREX) 500 MG tablet Take 500 mg by mouth daily.   11  . docusate sodium (COLACE) 100 MG capsule Take 1 capsule (100 mg total) by mouth 2 (two) times daily. (Patient not taking: Reported on 07/29/2016) 10 capsule 0  .  HYDROcodone-acetaminophen (NORCO/VICODIN) 5-325 MG tablet Take 1-2 tablets by mouth every 4 (four) hours as needed for moderate pain. (Patient not taking: Reported on 09/07/2015) 30 tablet 0  . ondansetron (ZOFRAN ODT) 4 MG disintegrating tablet Allow 1-2 tablets to dissolve in your mouth every 8 hours as needed for nausea/vomiting 30 tablet 0  . Polyethylene Glycol POWD Take 17 g by mouth daily. 500 g 1   No facility-administered medications prior to visit.      Per HPI unless specifically indicated in ROS section below Review of Systems     Objective:    BP 134/80   Pulse (!) 104   Temp 98.3 F (36.8 C) (Oral)   Ht 5\' 7"  (1.702 m)   Wt 235 lb 8 oz (106.8 kg)   BMI 36.88 kg/m   Wt Readings from Last 3 Encounters:  07/29/16 235 lb 8 oz (106.8 kg)  07/03/16 238 lb (108 kg)  09/07/15 239 lb (108.4 kg)    Physical Exam  Constitutional: He is oriented to person, place, and time. He appears well-developed and well-nourished. No distress.  HENT:  Head: Normocephalic and atraumatic.  Mouth/Throat: Oropharynx is clear and moist. No oropharyngeal exudate.  Eyes: Conjunctivae and EOM are normal. Pupils are equal, round, and reactive to light. No scleral icterus.  Neck: Normal range of motion. Neck supple. No thyromegaly present.  Cardiovascular: Normal rate, regular rhythm, normal heart sounds and  intact distal pulses.   No murmur heard. Pulmonary/Chest: Effort normal and breath sounds normal. No respiratory distress. He has no wheezes. He has no rales.  Musculoskeletal: He exhibits no edema.  Lymphadenopathy:    He has no cervical adenopathy.  Neurological: He is alert and oriented to person, place, and time.  Skin: Skin is warm and dry. No rash noted.  Psychiatric: He has a normal mood and affect.  Nursing note and vitals reviewed.  Results for orders placed or performed during the hospital encounter of 07/03/16  Lipase, blood  Result Value Ref Range   Lipase 42 11 - 51 U/L    Comprehensive metabolic panel  Result Value Ref Range   Sodium 135 135 - 145 mmol/L   Potassium 3.5 3.5 - 5.1 mmol/L   Chloride 103 101 - 111 mmol/L   CO2 25 22 - 32 mmol/L   Glucose, Bld 98 65 - 99 mg/dL   BUN 15 6 - 20 mg/dL   Creatinine, Ser 0.92 0.61 - 1.24 mg/dL   Calcium 8.4 (L) 8.9 - 10.3 mg/dL   Total Protein 7.6 6.5 - 8.1 g/dL   Albumin 4.0 3.5 - 5.0 g/dL   AST 22 15 - 41 U/L   ALT 26 17 - 63 U/L   Alkaline Phosphatase 72 38 - 126 U/L   Total Bilirubin 0.7 0.3 - 1.2 mg/dL   GFR calc non Af Amer >60 >60 mL/min   GFR calc Af Amer >60 >60 mL/min   Anion gap 7 5 - 15  CBC  Result Value Ref Range   WBC 9.8 3.8 - 10.6 K/uL   RBC 5.43 4.40 - 5.90 MIL/uL   Hemoglobin 15.9 13.0 - 18.0 g/dL   HCT 48.3 40.0 - 52.0 %   MCV 88.9 80.0 - 100.0 fL   MCH 29.3 26.0 - 34.0 pg   MCHC 32.9 32.0 - 36.0 g/dL   RDW 15.1 (H) 11.5 - 14.5 %   Platelets 283 150 - 440 K/uL      Assessment & Plan:   Problem List Items Addressed This Visit    Anxiety    Chronic, stable. Continue wellbutrin and lorazepam PRN.       Benign essential HTN - Primary    Chronic, stable. Continue current regimen. Pt declines med refills at this time.       Diverticulitis of large intestine with perforation with bleeding    S/p hemicolectomy 2017 - no recurrence since.      Obesity, Class II, BMI 35-39.9, with comorbidity    Discussed healthy diet and lifestyle changes to affect sustainable weight loss.           Follow up plan: Return in about 3 months (around 10/26/2016) for annual exam, prior fasting for blood work.  Ria Bush, MD

## 2016-07-29 NOTE — Assessment & Plan Note (Signed)
Chronic, stable. Continue wellbutrin and lorazepam PRN.

## 2016-07-29 NOTE — Assessment & Plan Note (Signed)
Discussed healthy diet and lifestyle changes to affect sustainable weight loss  

## 2016-08-02 ENCOUNTER — Encounter: Payer: Self-pay | Admitting: Family Medicine

## 2016-08-08 ENCOUNTER — Ambulatory Visit: Payer: Self-pay | Admitting: Family Medicine

## 2016-09-16 ENCOUNTER — Encounter: Payer: Self-pay | Admitting: Family Medicine

## 2016-10-19 ENCOUNTER — Other Ambulatory Visit: Payer: Self-pay | Admitting: Family Medicine

## 2016-10-19 DIAGNOSIS — R7303 Prediabetes: Secondary | ICD-10-CM

## 2016-10-19 DIAGNOSIS — IMO0001 Reserved for inherently not codable concepts without codable children: Secondary | ICD-10-CM

## 2016-10-19 DIAGNOSIS — I10 Essential (primary) hypertension: Secondary | ICD-10-CM

## 2016-10-22 ENCOUNTER — Other Ambulatory Visit: Payer: 59

## 2016-10-24 ENCOUNTER — Other Ambulatory Visit (INDEPENDENT_AMBULATORY_CARE_PROVIDER_SITE_OTHER): Payer: 59

## 2016-10-24 DIAGNOSIS — E669 Obesity, unspecified: Secondary | ICD-10-CM | POA: Diagnosis not present

## 2016-10-24 DIAGNOSIS — R7303 Prediabetes: Secondary | ICD-10-CM | POA: Diagnosis not present

## 2016-10-24 DIAGNOSIS — IMO0001 Reserved for inherently not codable concepts without codable children: Secondary | ICD-10-CM

## 2016-10-24 DIAGNOSIS — I10 Essential (primary) hypertension: Secondary | ICD-10-CM

## 2016-10-24 LAB — BASIC METABOLIC PANEL
BUN: 19 mg/dL (ref 6–23)
CALCIUM: 9.7 mg/dL (ref 8.4–10.5)
CHLORIDE: 97 meq/L (ref 96–112)
CO2: 30 meq/L (ref 19–32)
CREATININE: 0.9 mg/dL (ref 0.40–1.50)
GFR: 99.38 mL/min (ref >60.00)
GLUCOSE: 105 mg/dL — AB (ref 70–99)
Potassium: 3.3 mEq/L — ABNORMAL LOW (ref 3.5–5.1)
SODIUM: 134 meq/L — AB (ref 135–145)

## 2016-10-24 LAB — LIPID PANEL
CHOLESTEROL: 199 mg/dL (ref 0–200)
HDL: 38.3 mg/dL — ABNORMAL LOW (ref >39.00)
LDL CALC: 143 mg/dL — AB (ref 0–99)
NonHDL: 160.23
TRIGLYCERIDES: 86 mg/dL (ref 0.0–149.0)
Total CHOL/HDL Ratio: 5
VLDL: 17.2 mg/dL (ref 0.0–40.0)

## 2016-10-24 LAB — TSH: TSH: 1.27 u[IU]/mL (ref 0.35–4.50)

## 2016-10-24 LAB — HEMOGLOBIN A1C: Hgb A1c MFr Bld: 5.8 % (ref 4.6–6.5)

## 2016-10-29 ENCOUNTER — Encounter: Payer: 59 | Admitting: Family Medicine

## 2016-10-30 ENCOUNTER — Ambulatory Visit (INDEPENDENT_AMBULATORY_CARE_PROVIDER_SITE_OTHER): Payer: 59 | Admitting: Family Medicine

## 2016-10-30 ENCOUNTER — Encounter: Payer: Self-pay | Admitting: Family Medicine

## 2016-10-30 VITALS — BP 134/70 | HR 85 | Temp 97.9°F | Ht 65.75 in | Wt 217.5 lb

## 2016-10-30 DIAGNOSIS — Z0000 Encounter for general adult medical examination without abnormal findings: Secondary | ICD-10-CM | POA: Insufficient documentation

## 2016-10-30 DIAGNOSIS — E669 Obesity, unspecified: Secondary | ICD-10-CM

## 2016-10-30 DIAGNOSIS — I10 Essential (primary) hypertension: Secondary | ICD-10-CM | POA: Diagnosis not present

## 2016-10-30 DIAGNOSIS — E1169 Type 2 diabetes mellitus with other specified complication: Secondary | ICD-10-CM | POA: Insufficient documentation

## 2016-10-30 DIAGNOSIS — E785 Hyperlipidemia, unspecified: Secondary | ICD-10-CM | POA: Insufficient documentation

## 2016-10-30 DIAGNOSIS — F419 Anxiety disorder, unspecified: Secondary | ICD-10-CM

## 2016-10-30 DIAGNOSIS — M79671 Pain in right foot: Secondary | ICD-10-CM

## 2016-10-30 DIAGNOSIS — R7303 Prediabetes: Secondary | ICD-10-CM | POA: Diagnosis not present

## 2016-10-30 DIAGNOSIS — IMO0001 Reserved for inherently not codable concepts without codable children: Secondary | ICD-10-CM

## 2016-10-30 MED ORDER — POTASSIUM 99 MG PO TABS: 1.0000 | ORAL_TABLET | Freq: Every day | ORAL | Status: DC

## 2016-10-30 NOTE — Patient Instructions (Addendum)
Potassium remaining a bit low - start OTC potassium supplement daily while we're on hydrochlorothiazide.  Labs are all stable. Continue ketogenic diet.  You are doing well today.  Return as needed or in 6 months for follow up visit.   Health Maintenance, Male A healthy lifestyle and preventive care is important for your health and wellness. Ask your health care provider about what schedule of regular examinations is right for you. What should I know about weight and diet?  Eat a Healthy Diet  Eat plenty of vegetables, fruits, whole grains, low-fat dairy products, and lean protein.  Do not eat a lot of foods high in solid fats, added sugars, or salt. Maintain a Healthy Weight  Regular exercise can help you achieve or maintain a healthy weight. You should:  Do at least 150 minutes of exercise each week. The exercise should increase your heart rate and make you sweat (moderate-intensity exercise).  Do strength-training exercises at least twice a week. Watch Your Levels of Cholesterol and Blood Lipids  Have your blood tested for lipids and cholesterol every 5 years starting at 40 years of age. If you are at high risk for heart disease, you should start having your blood tested when you are 40 years old. You may need to have your cholesterol levels checked more often if:  Your lipid or cholesterol levels are high.  You are older than 40 years of age.  You are at high risk for heart disease. What should I know about cancer screening? Many types of cancers can be detected early and may often be prevented. Lung Cancer  You should be screened every year for lung cancer if:  You are a current smoker who has smoked for at least 30 years.  You are a former smoker who has quit within the past 15 years.  Talk to your health care provider about your screening options, when you should start screening, and how often you should be screened. Colorectal Cancer  Routine colorectal cancer  screening usually begins at 40 years of age and should be repeated every 5-10 years until you are 40 years old. You may need to be screened more often if early forms of precancerous polyps or small growths are found. Your health care provider may recommend screening at an earlier age if you have risk factors for colon cancer.  Your health care provider may recommend using home test kits to check for hidden blood in the stool.  A small camera at the end of a tube can be used to examine your colon (sigmoidoscopy or colonoscopy). This checks for the earliest forms of colorectal cancer. Prostate and Testicular Cancer  Depending on your age and overall health, your health care provider may do certain tests to screen for prostate and testicular cancer.  Talk to your health care provider about any symptoms or concerns you have about testicular or prostate cancer. Skin Cancer  Check your skin from head to toe regularly.  Tell your health care provider about any new moles or changes in moles, especially if:  There is a change in a mole's size, shape, or color.  You have a mole that is larger than a pencil eraser.  Always use sunscreen. Apply sunscreen liberally and repeat throughout the day.  Protect yourself by wearing long sleeves, pants, a wide-brimmed hat, and sunglasses when outside. What should I know about heart disease, diabetes, and high blood pressure?  If you are 35-44 years of age, have your blood pressure checked  every 3-5 years. If you are 39 years of age or older, have your blood pressure checked every year. You should have your blood pressure measured twice-once when you are at a hospital or clinic, and once when you are not at a hospital or clinic. Record the average of the two measurements. To check your blood pressure when you are not at a hospital or clinic, you can use:  An automated blood pressure machine at a pharmacy.  A home blood pressure monitor.  Talk to your health  care provider about your target blood pressure.  If you are between 15-6 years old, ask your health care provider if you should take aspirin to prevent heart disease.  Have regular diabetes screenings by checking your fasting blood sugar level.  If you are at a normal weight and have a low risk for diabetes, have this test once every three years after the age of 81.  If you are overweight and have a high risk for diabetes, consider being tested at a younger age or more often.  A one-time screening for abdominal aortic aneurysm (AAA) by ultrasound is recommended for men aged 67-75 years who are current or former smokers. What should I know about preventing infection? Hepatitis B  If you have a higher risk for hepatitis B, you should be screened for this virus. Talk with your health care provider to find out if you are at risk for hepatitis B infection. Hepatitis C  Blood testing is recommended for:  Everyone born from [birth year] through 1964.  Anyone with known risk factors for hepatitis C. Sexually Transmitted Diseases (STDs)  You should be screened each year for STDs including gonorrhea and chlamydia if:  You are sexually active and are younger than 40 years of age.  You are older than 40 years of age and your health care provider tells you that you are at risk for this type of infection.  Your sexual activity has changed since you were last screened and you are at an increased risk for chlamydia or gonorrhea. Ask your health care provider if you are at risk.  Talk with your health care provider about whether you are at high risk of being infected with HIV. Your health care provider may recommend a prescription medicine to help prevent HIV infection. What else can I do?  Schedule regular health, dental, and eye exams.  Stay current with your vaccines (immunizations).  Do not use any tobacco products, such as cigarettes, chewing tobacco, and e-cigarettes. If you need help quitting,  ask your health care provider.  Limit alcohol intake to no more than 2 drinks per day. One drink equals 12 ounces of beer, 5 ounces of wine, or 1 ounces of hard liquor.  Do not use street drugs.  Do not share needles.  Ask your health care provider for help if you need support or information about quitting drugs.  Tell your health care provider if you often feel depressed.  Tell your health care provider if you have ever been abused or do not feel safe at home. This information is not intended to replace advice given to you by your health care provider. Make sure you discuss any questions you have with your health care provider. Document Released: 11/16/2007 Document Revised: 01/17/2016 Document Reviewed: 02/21/2015 Elsevier Interactive Patient Education  2017 [employer].

## 2016-10-30 NOTE — Assessment & Plan Note (Signed)
Anticipate plantar fasciitis, less likely heel spur. Exercises from [facility] pt advisor provided, discussed continued NSAID use, frozen water bottle massages, stretching, and consider trial heel lift cushion. Update if not improving, consider podiatry referral.

## 2016-10-30 NOTE — Assessment & Plan Note (Signed)
Chronic, stable on wellbutrin XL and lorazepam PRN.

## 2016-10-30 NOTE — Assessment & Plan Note (Addendum)
Has started keto diet. Congratulated on weight loss to date. Feels this is sustainable.

## 2016-10-30 NOTE — Progress Notes (Addendum)
BP 134/70   Pulse 85   Temp 97.9 F (36.6 C) (Oral)   Ht 5' 5.75" (1.67 m)   Wt 217 lb 8 oz (98.7 kg)   SpO2 97%   BMI 35.37 kg/m    CC: CPE Subjective:    Patient ID: Neil Mcintyre, male    DOB: Oct 04, 1975, 41 y.o.   MRN: [id number]  HPI: Neil Mcintyre is a 41 y.o. male presenting on 10/30/2016 for Annual Exam and Foot Pain (R heel)   20 lb weight loss since he started keto diet 3 months ago.   R heel/sole pain ongoing over last few weeks. More painful at night, improved with activity. noted pain first few steps in the morning. Compression socks have helped. Treating with ibuprofen as well which has helped.   Preventative: Family history not known (adopted) Colonoscopy 2017 and hemicolectomy for severe diverticulitis.  Flu shot yearly Tetanus unsure Seat belt use discussed Sunscreen use discussed. 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 Interim medical history since our last visit reviewed. Allergies and medications reviewed and updated. Outpatient Medications Prior to Visit  Medication Sig Dispense Refill  . amLODipine (NORVASC) 5 MG tablet Take 5 mg by mouth daily.     Marland Kitchen buPROPion (WELLBUTRIN XL) 150 MG 24 hr tablet Take 150 mg by mouth daily.     Marland Kitchen LORazepam (ATIVAN) 0.5 MG tablet Take 0.5 mg by mouth daily as needed.     Marland Kitchen losartan-hydrochlorothiazide (HYZAAR) 100-25 MG tablet Take 1 tablet by mouth daily.  2  . valACYclovir (VALTREX) 500 MG tablet Take 500 mg by mouth daily.   11   No facility-administered medications prior to visit.      Per HPI unless specifically indicated in ROS section below Review of Systems  Constitutional: Negative for activity change, appetite change, chills, fatigue, fever and unexpected  weight change.  HENT: Negative for hearing loss.   Eyes: Negative for visual disturbance ( ).  Respiratory: Negative for cough, chest tightness, shortness of breath and wheezing.   Cardiovascular: Negative for chest pain, palpitations and leg swelling.  Gastrointestinal: Negative for abdominal distention, abdominal pain, blood in stool, constipation, diarrhea, nausea and vomiting.  Genitourinary: Negative for difficulty urinating and hematuria.  Musculoskeletal: Negative for arthralgias, myalgias and neck pain.  Skin: Negative for rash.  Neurological: Negative for dizziness, seizures, syncope and headaches.  Hematological: Negative for adenopathy. Does not bruise/bleed easily.  Psychiatric/Behavioral: Negative for dysphoric mood. The patient is not nervous/anxious.        Objective:    BP 134/70   Pulse 85   Temp 97.9 F (36.6 C) (Oral)   Ht 5' 5.75" (1.67 m)   Wt 217 lb 8 oz (98.7 kg)   SpO2 97%   BMI 35.37 kg/m   Wt Readings from Last 3 Encounters:  10/30/16 217 lb 8 oz (98.7 kg)  07/29/16 235 lb 8 oz (106.8 kg)  07/03/16 238 lb (108 kg)    Physical Exam  Constitutional: He is oriented to person, place, and time. He appears well-developed and well-nourished. No distress.  HENT:  Head: Normocephalic and atraumatic.  Right Ear: Hearing, tympanic membrane, external ear and ear canal normal.  Left Ear: Hearing, tympanic membrane, external ear and ear  canal normal.  Nose: Nose normal.  Mouth/Throat: Uvula is midline, oropharynx is clear and moist and mucous membranes are normal. No oropharyngeal exudate, posterior oropharyngeal edema or posterior oropharyngeal erythema.  Eyes: Conjunctivae and EOM are normal. Pupils are equal, round, and reactive to light. No scleral icterus.  Neck: Normal range of motion. Neck supple. No thyromegaly present.  Cardiovascular: Normal rate, regular rhythm, normal heart sounds and intact distal pulses.   No murmur heard. Pulses:      Radial  pulses are 2+ on the right side, and 2+ on the left side.  Pulmonary/Chest: Effort normal and breath sounds normal. No respiratory distress. He has no wheezes. He has no rales.  Abdominal: Soft. Bowel sounds are normal. He exhibits no distension and no mass. There is no tenderness. There is no rebound and no guarding.  Musculoskeletal: Normal range of motion. He exhibits no edema.  R mid heel tenderness to palpation No pain with calcaneal squeeze No ankle ligament laxity  Lymphadenopathy:    He has no cervical adenopathy.  Neurological: He is alert and oriented to person, place, and time.  CN grossly intact, station and gait intact  Skin: Skin is warm and dry. No rash noted.  Psychiatric: He has a normal mood and affect. His behavior is normal. Judgment and thought content normal.  Nursing note and vitals reviewed.  Results for orders placed or performed in visit on 10/24/16  Lipid panel  Result Value Ref Range   Cholesterol 199 0 - 200 mg/dL   Triglycerides 86.0 0.0 - 149.0 mg/dL   HDL 38.30 (L) >39.00 mg/dL   VLDL 17.2 0.0 - 40.0 mg/dL   LDL Cholesterol 143 (H) 0 - 99 mg/dL   Total CHOL/HDL Ratio 5    NonHDL 160.23   Hemoglobin A1c  Result Value Ref Range   Hgb A1c MFr Bld 5.8 4.6 - 6.5 %  Basic metabolic panel  Result Value Ref Range   Sodium 134 (L) 135 - 145 mEq/L   Potassium 3.3 (L) 3.5 - 5.1 mEq/L   Chloride 97 96 - 112 mEq/L   CO2 30 19 - 32 mEq/L   Glucose, Bld 105 (H) 70 - 99 mg/dL   BUN 19 6 - 23 mg/dL   Creatinine, Ser 0.90 0.40 - 1.50 mg/dL   Calcium 9.7 8.4 - 10.5 mg/dL   GFR 99.38 >60.00 mL/min  TSH  Result Value Ref Range   TSH 1.27 0.35 - 4.50 uIU/mL      Assessment & Plan:   Problem List Items Addressed This Visit    Anxiety    Chronic, stable on wellbutrin XL and lorazepam PRN.       Benign essential HTN    Chronic, stable. Continue current regimen.  Potassium low - rec start OTC potassium supplement while on HCTZ.       Dyslipidemia    Mild  off meds. Continue to monitor.       Health maintenance examination - Primary    Preventative protocols reviewed and updated unless pt declined. Discussed healthy diet and lifestyle.       Obesity, Class II, BMI 35-39.9, with comorbidity    Has started keto diet. Congratulated on weight loss to date. Feels this is sustainable.       Pain of right heel    Anticipate plantar fasciitis, less likely heel spur. Exercises from [facility] pt advisor provided, discussed continued NSAID use, frozen water bottle massages, stretching, and consider trial heel lift cushion. Update if  not improving, consider podiatry referral.       Prediabetes    Discussed labs, encouraged continued low carb diet.           Follow up plan: Return in about 6 months (around 05/02/2017) for follow up visit.  Ria Bush, MD

## 2016-10-30 NOTE — Assessment & Plan Note (Addendum)
Chronic, stable. Continue current regimen.  Potassium low - rec start OTC potassium supplement while on HCTZ.

## 2016-10-30 NOTE — Assessment & Plan Note (Signed)
Mild off meds. Continue to monitor.  

## 2016-10-30 NOTE — Assessment & Plan Note (Signed)
Preventative protocols reviewed and updated unless pt declined. Discussed healthy diet and lifestyle.  

## 2016-10-30 NOTE — Assessment & Plan Note (Signed)
Discussed labs, encouraged continued low carb diet.

## 2016-11-07 IMAGING — CT CT ABD-PELV W/ CM
1 of 3 series · 13 of 32 positions shown, 18 images · IV contrast (omnipaque)
Comparison: July 23, 2014

CLINICAL DATA: Lower abdominal pain with chills. Elevated white
blood cell count. Nausea. History of diverticulitis.

EXAM:
CT ABDOMEN AND PELVIS WITH CONTRAST
TECHNIQUE: Multidetector CT imaging of the abdomen and pelvis was performed
using the standard protocol following bolus administration of
intravenous contrast. Oral contrast was also administered.
CONTRAST:  100mL OMNIPAQUE IOHEXOL 350 MG/ML SOLN

[Series 2: routine abd pel with · axial · 0.87mm/px · z∈[-572,-142]mm · 13 of 98 slices shown, 18 images]
[im 6/98  soft-tissue]
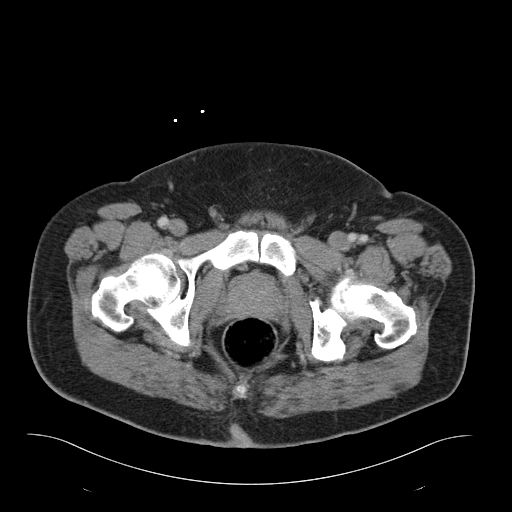
[im 6/98  bone]
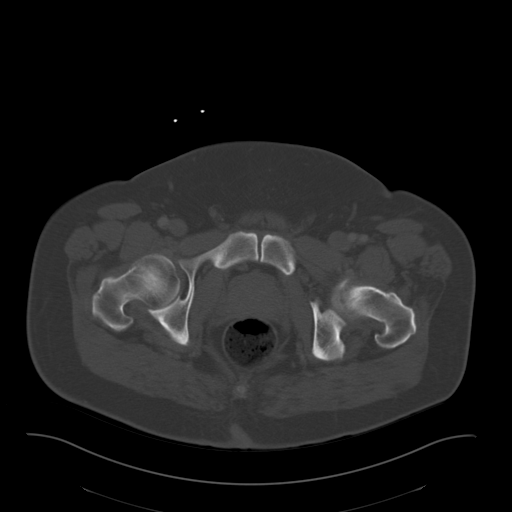
[im 16/98  soft-tissue]
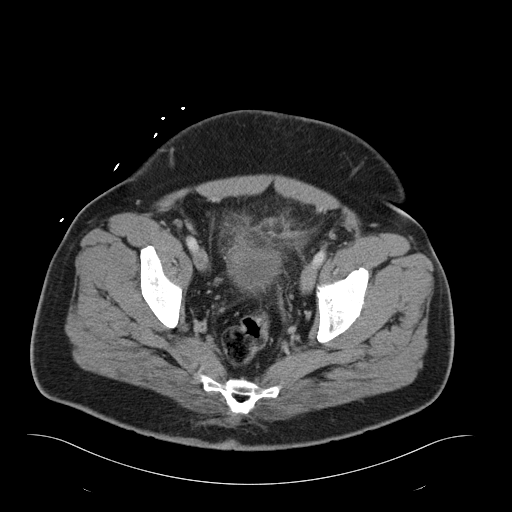
[im 21/98  soft-tissue]
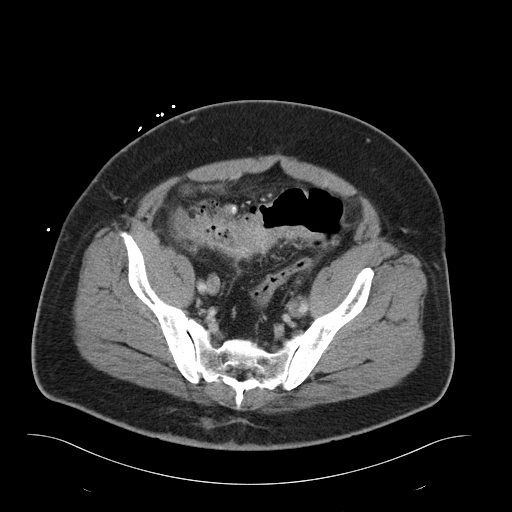
[im 31/98  soft-tissue]
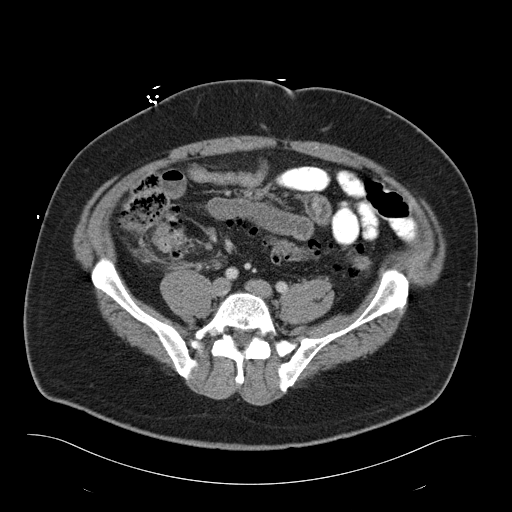
[im 36/98  soft-tissue]
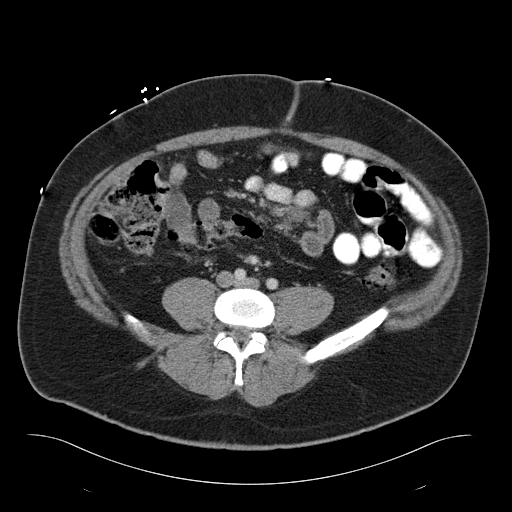
[im 46/98  soft-tissue]
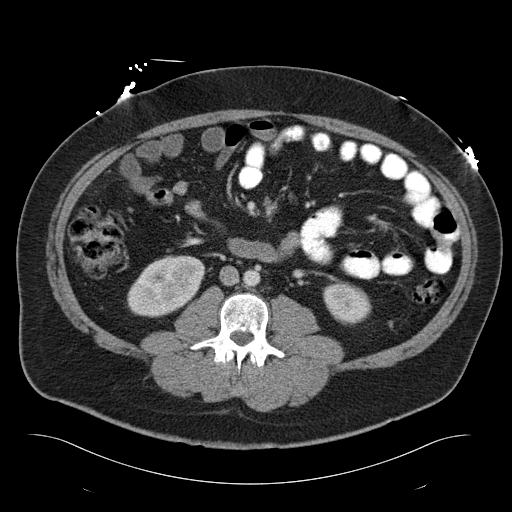
[im 52/98  soft-tissue]
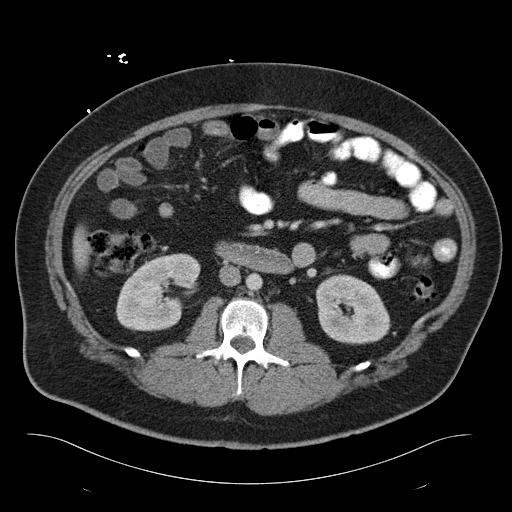
[im 62/98  soft-tissue]
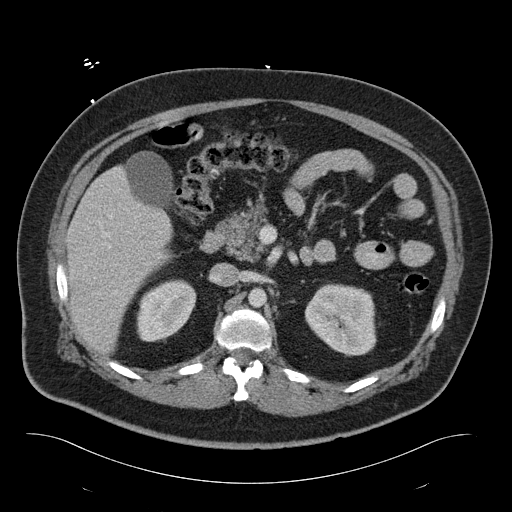
[im 67/98  soft-tissue]
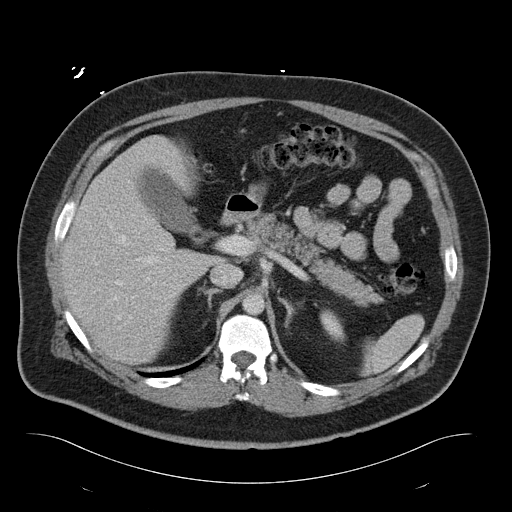
[im 67/98  bone]
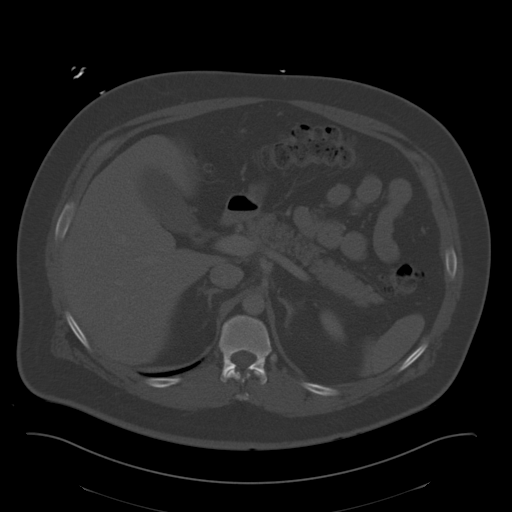
[im 77/98  soft-tissue]
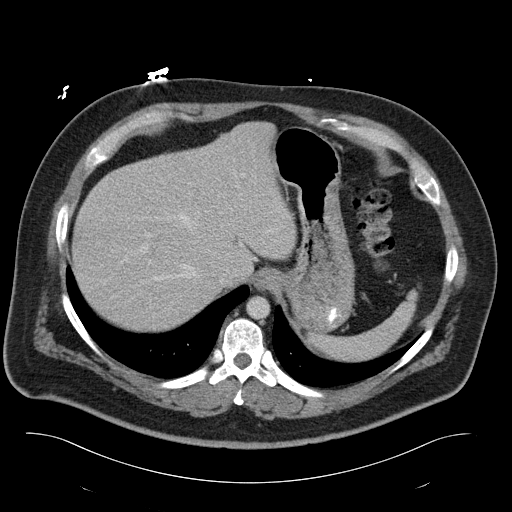
[im 77/98  lung]
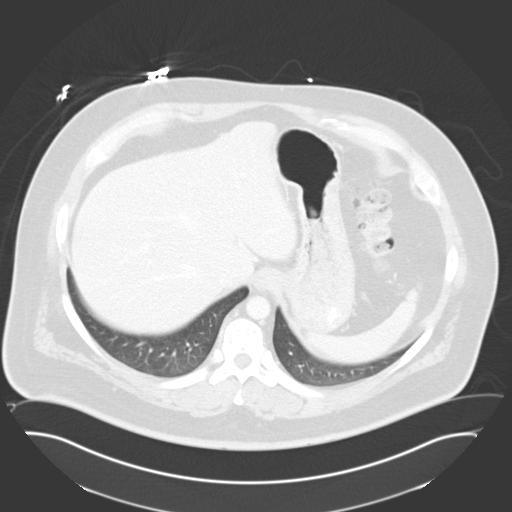
[im 82/98  soft-tissue]
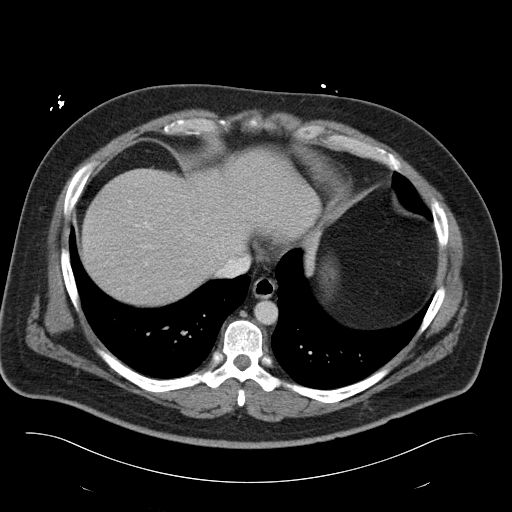
[im 82/98  lung]
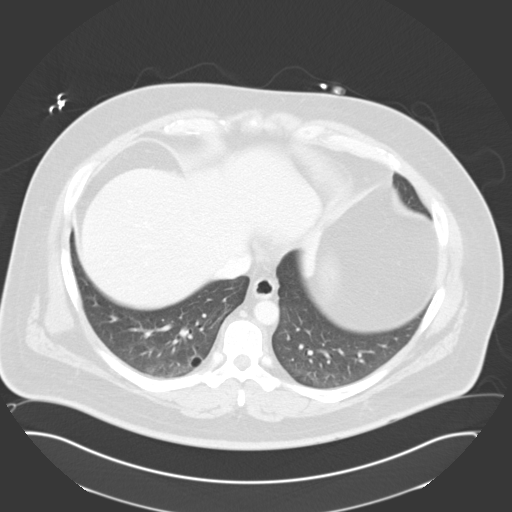
[im 87/98  lung]
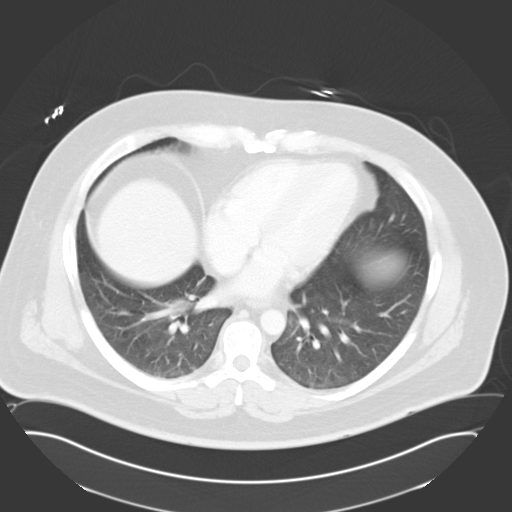
[im 92/98  soft-tissue]
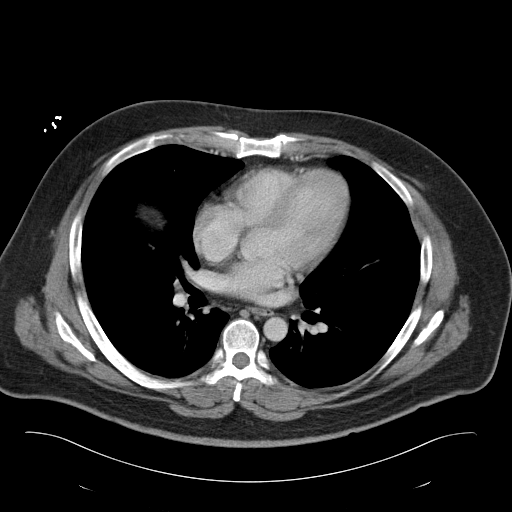
[im 92/98  lung]
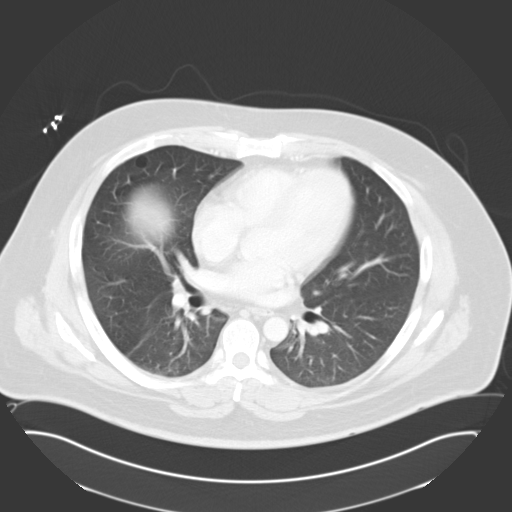

[13 of 32 positions shown; findings below may reference images not displayed]

FINDINGS: Lower chest:  Lung bases are clear.

Hepatobiliary: Liver is prominent, measuring 20.2 cm in length. No
focal liver lesions are identified. Gallbladder wall is not
appreciably thickened. There is no biliary duct dilatation.

Pancreas: There is no pancreatic mass or inflammation.

Spleen: No splenic lesions are identified.

Adrenals/Urinary Tract: Adrenals appear normal bilaterally. Kidneys
bilaterally show no mass or hydronephrosis on either side. There is
no renal or ureteral calculus on either side. The urinary bladder is
midline with the wall thickness within normal limits given the
degree of bladder distention.

Stomach/Bowel: There is extensive inflammatory change in the mid
sigmoid colon traversing across the midline and toward the right
pelvis. There is diffuse wall thickening in this area with several
irregular diverticula and surrounding mesenteric stranding. There is
no frank abscess. There are at least one and possibly two tiny foci
of extraluminal air in the right upper pelvis consistent with a mild
degree of microperforation in the area of diverticulitis.

Elsewhere, there are multiple diverticula in the proximal to mid
descending colon without inflammation. No bowel obstruction. No
demonstrable portal venous air.

Vascular/Lymphatic: Aorta is nonaneurysmal. The major mesenteric
vessels appear intact. No vascular lesions are appreciable. There is
no appreciable adenopathy in the abdomen or pelvis.

Reproductive: Prostate and seminal vesicles are normal in size and
contour.

Other: Appendix appears normal. There is stranding in the nearby
mesenteric, felt to be due to changes from the nearby diverticulitis
as opposed to inflammation of the appendix itself. No abscess is
seen in the abdomen or pelvis. There is no demonstrable ascites.

Musculoskeletal: There are no blastic or lytic bone lesions. No
intramuscular or abdominal wall lesions.
IMPRESSION: Diverticulitis in the mid sigmoid colon. This diverticulitis is in
the same location as prior diverticulitis but is more extensive
compared to prior study. There is minimal evidence of
microperforation in the right upper pelvis. No frank abscess
appreciable. There is stranding near the appendix, but the appendix
is felt to be normal; the stranding is felt to be due to the nearby
diverticulitis which extends well to the right of midline.

No bowel obstruction. No abscess elsewhere in the abdomen or pelvis.

No renal or ureteral calculus.  No hydronephrosis.

Prominent liver without focal lesion.

Critical Value/emergent results were called by telephone at the time
of interpretation on 05/09/2015 at [DATE] to Dr. LESYA AUJLA , who
verbally acknowledged these results.

## 2016-11-22 ENCOUNTER — Other Ambulatory Visit: Payer: Self-pay

## 2016-11-22 NOTE — Telephone Encounter (Signed)
Last refill I don't see that it was refilled before.  Last OV 10/30/16 Ok to refill?

## 2016-11-25 MED ORDER — LORAZEPAM 0.5 MG PO TABS: 0.5000 mg | ORAL_TABLET | Freq: Every day | ORAL | 0 refills | Status: DC | PRN

## 2016-11-25 NOTE — Telephone Encounter (Signed)
Rx called in to requested pharmacy 

## 2016-11-25 NOTE — Telephone Encounter (Signed)
plz phone in. 

## 2017-01-02 LAB — LIPID PANEL
Cholesterol: 256 — AB (ref 0–200)
HDL: 62 (ref 35–70)
LDL Cholesterol: 177
Triglycerides: 1 — AB (ref 40–160)

## 2017-01-02 LAB — BASIC METABOLIC PANEL: Creatinine: 0.8 (ref 0.6–1.3)

## 2017-01-07 ENCOUNTER — Telehealth: Payer: Self-pay

## 2017-01-07 NOTE — Telephone Encounter (Signed)
Pt left v/m; pt just got results on health screening and cholesterol level was 277. Pt is presently doing the keto diet and wonders with high cholesterol is the keto diet OK. Pt request cb with what to do to get cholesterol in better range. Last annual 10/30/16.

## 2017-01-08 NOTE — Telephone Encounter (Signed)
He has valid concerns - we will likely need to monitor chol levels more closely in keto diet. Would prefer just a low carb low sugar diet rather than keto diet. May mail low chol diet handout.

## 2017-01-08 NOTE — Telephone Encounter (Signed)
Spoke to pt and advised per DrG. Lipid lowering info mailed as requested

## 2017-02-05 ENCOUNTER — Ambulatory Visit (INDEPENDENT_AMBULATORY_CARE_PROVIDER_SITE_OTHER): Payer: 59 | Admitting: Family Medicine

## 2017-02-05 ENCOUNTER — Encounter: Payer: Self-pay | Admitting: Family Medicine

## 2017-02-05 ENCOUNTER — Ambulatory Visit: Payer: Self-pay | Admitting: Family Medicine

## 2017-02-05 VITALS — BP 122/78 | HR 90 | Temp 97.8°F | Wt 226.5 lb

## 2017-02-05 DIAGNOSIS — E669 Obesity, unspecified: Secondary | ICD-10-CM | POA: Diagnosis not present

## 2017-02-05 DIAGNOSIS — E785 Hyperlipidemia, unspecified: Secondary | ICD-10-CM

## 2017-02-05 DIAGNOSIS — IMO0001 Reserved for inherently not codable concepts without codable children: Secondary | ICD-10-CM

## 2017-02-05 NOTE — Progress Notes (Signed)
BP 122/78   Pulse 90   Temp 97.8 F (36.6 C) (Oral)   Wt 226 lb 8 oz (102.7 kg)   SpO2 97%   BMI 36.84 kg/m    CC: discuss appeal form Subjective:    Patient ID: Neil Mcintyre, male    DOB: 1975-11-01, 41 y.o.   MRN: [id number]  HPI: Neil Mcintyre is a 41 y.o. male presenting on 02/05/2017 for Follow-up (paperwork for BMI)   Was following keto-diet with resultant 20 lb weight loss however when cholesterol levels returned elevated, he changed to low cholesterol diet, low carb diet with resultant weight gain of 9 lbs. Came off Keto-diet 01/08/2017.   Requests we fill out BMI appeal form for [facility].  Activity - trying to increase walking. Thinking about joining gym.  Promotion - more sedentary office job.   Keto-diet has been most effective diet to date.   Requests letter for work requesting ok to go without shaving due to old scarring of face leading to easy and difficult to control bleeding. No other easy bruising/bleeding.   Relevant past medical, surgical, family and social history reviewed and updated as indicated. Interim medical history since our last visit reviewed. Allergies and medications reviewed and updated. Outpatient Medications Prior to Visit  Medication Sig Dispense Refill  . amLODipine (NORVASC) 5 MG tablet Take 5 mg by mouth daily.     Marland Kitchen buPROPion (WELLBUTRIN XL) 150 MG 24 hr tablet Take 150 mg by mouth daily.     Marland Kitchen LORazepam (ATIVAN) 0.5 MG tablet Take 1 tablet (0.5 mg total) by mouth daily as needed. 30 tablet 0  . losartan-hydrochlorothiazide (HYZAAR) 100-25 MG tablet Take 1 tablet by mouth daily.  2  . Potassium 99 MG TABS Take 1 tablet (99 mg total) by mouth daily.    . valACYclovir (VALTREX) 500 MG tablet Take 500 mg by mouth daily.   11   No facility-administered medications prior to visit.      Per HPI unless specifically indicated in ROS section below Review of Systems     Objective:    BP 122/78   Pulse 90   Temp 97.8 F (36.6 C) (Oral)    Wt 226 lb 8 oz (102.7 kg)   SpO2 97%   BMI 36.84 kg/m   Wt Readings from Last 3 Encounters:  02/05/17 226 lb 8 oz (102.7 kg)  10/30/16 217 lb 8 oz (98.7 kg)  07/29/16 235 lb 8 oz (106.8 kg)    Physical Exam  Constitutional: He appears well-developed and well-nourished. No distress.  Nursing note and vitals reviewed.  Results for orders placed or performed in visit on 10/24/16  Lipid panel  Result Value Ref Range   Cholesterol 199 0 - 200 mg/dL   Triglycerides 86.0 0.0 - 149.0 mg/dL   HDL 38.30 (L) >39.00 mg/dL   VLDL 17.2 0.0 - 40.0 mg/dL   LDL Cholesterol 143 (H) 0 - 99 mg/dL   Total CHOL/HDL Ratio 5    NonHDL 160.23   Hemoglobin A1c  Result Value Ref Range   Hgb A1c MFr Bld 5.8 4.6 - 6.5 %  Basic metabolic panel  Result Value Ref Range   Sodium 134 (L) 135 - 145 mEq/L   Potassium 3.3 (L) 3.5 - 5.1 mEq/L   Chloride 97 96 - 112 mEq/L   CO2 30 19 - 32 mEq/L   Glucose, Bld 105 (H) 70 - 99 mg/dL   BUN 19 6 - 23 mg/dL  Creatinine, Ser 0.90 0.40 - 1.50 mg/dL   Calcium 9.7 8.4 - 10.5 mg/dL   GFR 99.38 >60.00 mL/min  TSH  Result Value Ref Range   TSH 1.27 0.35 - 4.50 uIU/mL      Assessment & Plan:  Letter for work filled out.  Problem List Items Addressed This Visit    Dyslipidemia - Primary    Reviewed recent labwork he brings and asked to scan/update in chart. Total chol increased ~40 points with 30pt elevation in LDL and 20pt elevation in HDL. Reviewed with patient. Likely results of keto-diet. Will recheck FLP tomorrow and then decide on whether we can restart keto-diet or will just recommend low carb low fat diet. Pt agrees with plan.       Relevant Orders   Lipid panel   Obesity, Class II, BMI 35-39.9, with comorbidity    Keto diet led to weight loss (most effective and sustainable diet plan for patient to date) but worsening LDL and improved HDL. rec recheck FLP tomorrow then will decide whether to continue keto diet.  He previously saw nutritionist and didn't  feel this was very helpful, declines return.           Follow up plan: Return if symptoms worsen or fail to improve.  Ria Bush, MD

## 2017-02-05 NOTE — Assessment & Plan Note (Signed)
Reviewed recent labwork he brings and asked to scan/update in chart. Total chol increased ~40 points with 30pt elevation in LDL and 20pt elevation in HDL. Reviewed with patient. Likely results of keto-diet. Will recheck FLP tomorrow and then decide on whether we can restart keto-diet or will just recommend low carb low fat diet. Pt agrees with plan.

## 2017-02-05 NOTE — Assessment & Plan Note (Addendum)
Keto diet led to weight loss (most effective and sustainable diet plan for patient to date) but worsening LDL and improved HDL. rec recheck FLP tomorrow then will decide whether to continue keto diet.  He previously saw nutritionist and didn't feel this was very helpful, declines return.

## 2017-02-05 NOTE — Patient Instructions (Addendum)
Return for fasting labs. Then we will decide on restarting keto-diet or just continue low carb low cholesterol diet.  Form for [facility] filled out today.

## 2017-02-06 ENCOUNTER — Other Ambulatory Visit (INDEPENDENT_AMBULATORY_CARE_PROVIDER_SITE_OTHER): Payer: 59

## 2017-02-06 DIAGNOSIS — E785 Hyperlipidemia, unspecified: Secondary | ICD-10-CM

## 2017-02-06 LAB — LIPID PANEL
CHOL/HDL RATIO: 4
Cholesterol: 189 mg/dL (ref 0–200)
HDL: 44.4 mg/dL (ref >39.00)
LDL CALC: 126 mg/dL — AB (ref 0–99)
NonHDL: 144.18
TRIGLYCERIDES: 92 mg/dL (ref 0.0–149.0)
VLDL: 18.4 mg/dL (ref 0.0–40.0)

## 2017-02-06 IMAGING — CR DG CHEST 2V
1 series · 2 of 2 positions shown · non-contrast
Comparison: No recent.

CLINICAL DATA: Colon surgery.  Preoperative chest x-ray.

EXAM:
CHEST  2 VIEW

[Series 1: dg chest 2 view · 0.14mm/px · 2 of 2 slices shown]
[im 1/2]
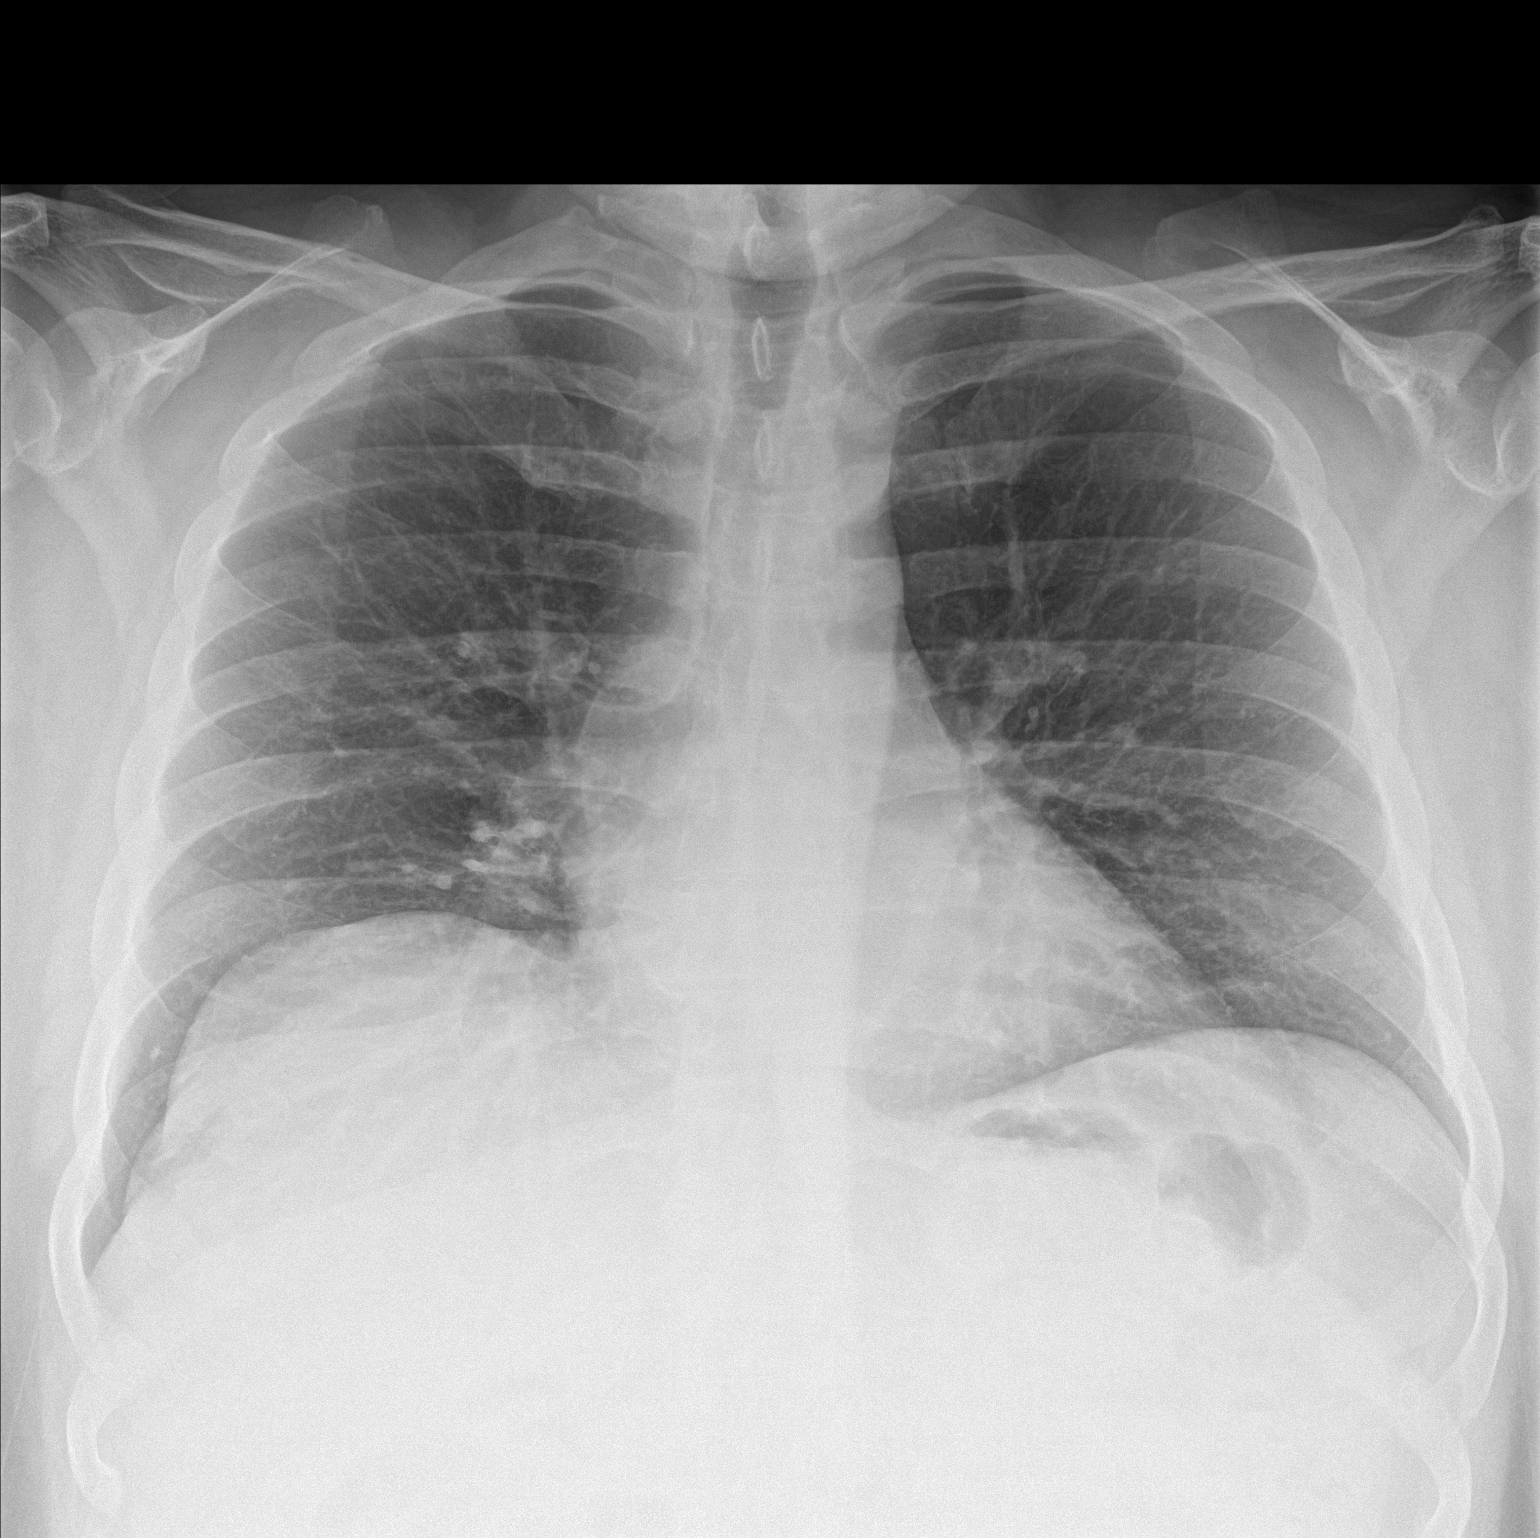
[im 2/2]
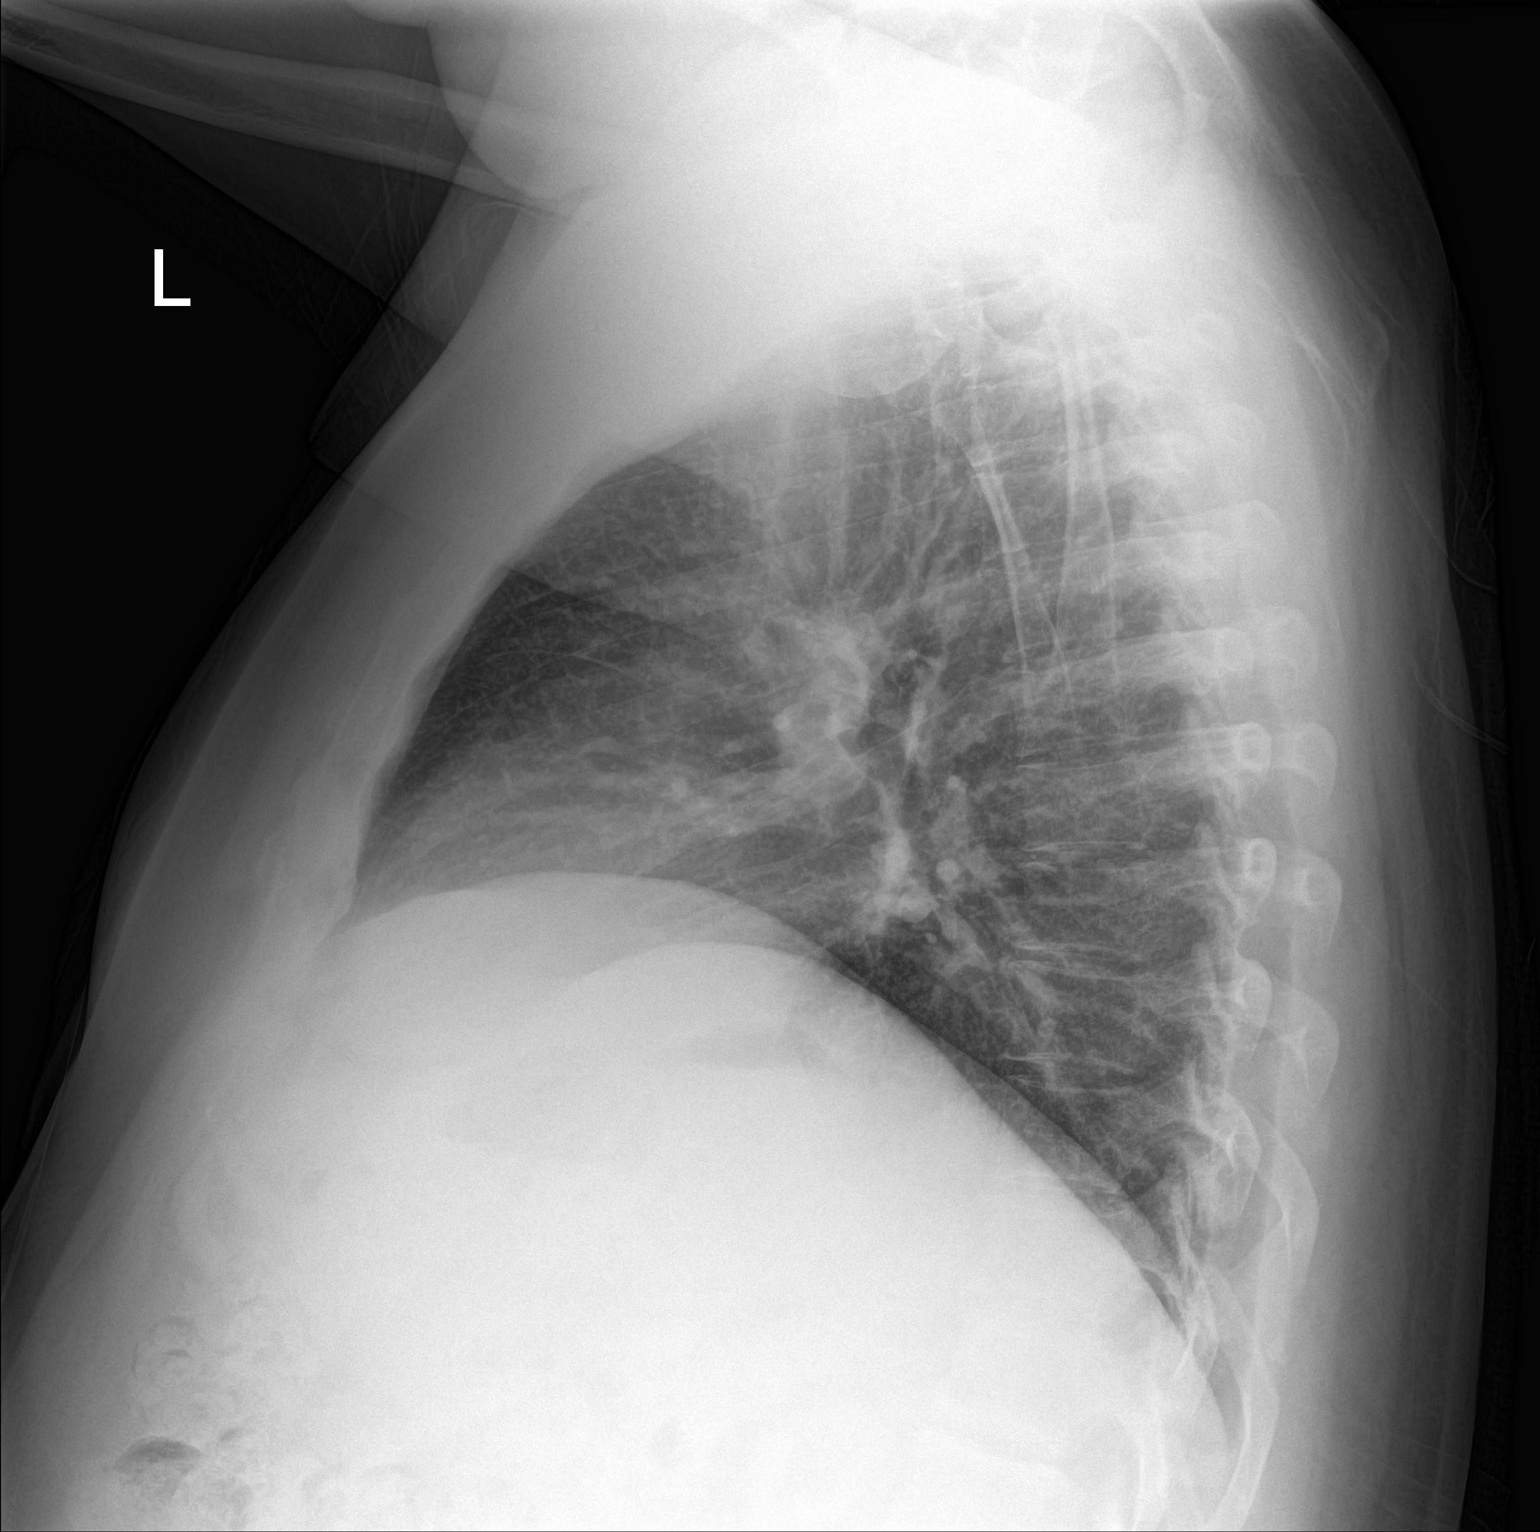

[2 of 2 positions shown; findings below may reference images not displayed]

FINDINGS: Mediastinum hilar structures normal. Low lung volumes with mild
basilar atelectasis. No focal infiltrate. No pleural effusion or
pneumothorax. Heart size normal. No acute bony abnormality.
IMPRESSION: Low lung volumes with mild basilar atelectasis. Otherwise negative
chest.

## 2017-02-10 ENCOUNTER — Encounter: Payer: Self-pay | Admitting: *Deleted

## 2017-02-17 ENCOUNTER — Telehealth: Payer: Self-pay

## 2017-02-17 NOTE — Telephone Encounter (Signed)
Lorazepam last phoned in 11/25/16, last OV 02/05/17 next OV 05/02/17. [facility] fax form placed in Dr. Synthia Innocent inbox.

## 2017-02-18 MED ORDER — LOSARTAN POTASSIUM-HCTZ 100-25 MG PO TABS: 1.0000 | ORAL_TABLET | Freq: Every day | ORAL | 3 refills | Status: DC

## 2017-02-18 MED ORDER — AMLODIPINE BESYLATE 5 MG PO TABS: 5.0000 mg | ORAL_TABLET | Freq: Every day | ORAL | 3 refills | Status: DC

## 2017-02-18 MED ORDER — LORAZEPAM 0.5 MG PO TABS: 0.5000 mg | ORAL_TABLET | Freq: Every day | ORAL | 0 refills | Status: DC | PRN

## 2017-02-18 NOTE — Telephone Encounter (Signed)
Refill left on vm at pharmacy.  

## 2017-02-18 NOTE — Telephone Encounter (Signed)
Amlodipine and losartan/hctz refilled. Recommend lorazepam continue locally as it is controlled substance. plz phone in.

## 2017-02-20 ENCOUNTER — Telehealth: Payer: Self-pay

## 2017-02-20 NOTE — Telephone Encounter (Signed)
Pt left v/m pt thinks he may be going thru mid life crisis; pt does not feel like he is beneficial to the family and pt cant do certain things he should be able to do. Pt is at work and cannot talk now. pt wants to go back on lorazepam and pt thinks wellbutrin needs to be adjusted. Last annual 10/30/16. I spoke with pt and he does not feel like he is running his own life; not satisfying wife sexually, has urge to do something like buy a motorcycle. This happened suddenly about 1 wk ago. No SI or HI. Pt scheduled 30' appt 02/21/17 at 3 PM and if condition changes or worsens prior to appt to will go to ED; pt is at work now at [employer]. FYI to Dr Darnell Level.

## 2017-02-21 ENCOUNTER — Encounter: Payer: Self-pay | Admitting: Family Medicine

## 2017-02-21 ENCOUNTER — Ambulatory Visit (INDEPENDENT_AMBULATORY_CARE_PROVIDER_SITE_OTHER): Payer: 59 | Admitting: Family Medicine

## 2017-02-21 VITALS — BP 124/80 | HR 102 | Temp 97.9°F | Wt 224.8 lb

## 2017-02-21 DIAGNOSIS — Z23 Encounter for immunization: Secondary | ICD-10-CM | POA: Diagnosis not present

## 2017-02-21 DIAGNOSIS — F39 Unspecified mood [affective] disorder: Secondary | ICD-10-CM | POA: Diagnosis not present

## 2017-02-21 MED ORDER — BUPROPION HCL ER (SR) 100 MG PO TB12: 100.0000 mg | ORAL_TABLET | Freq: Every day | ORAL | 6 refills | Status: DC

## 2017-02-21 NOTE — Patient Instructions (Addendum)
Flu shot today. Let's decrease wellbutrin to 100mg  daily -new dose at pharmacy. Update me with how you're doing in ~2 weeks.  Fill lorazepam to use as needed We will refer you to counselor. Keep working on stress relieving strategies.

## 2017-02-21 NOTE — Progress Notes (Signed)
BP 124/80 (BP Location: Left Arm, Patient Position: Sitting, Cuff Size: Large)   Pulse (!) 102   Temp 97.9 F (36.6 C) (Oral)   Wt 224 lb 12 oz (101.9 kg)   SpO2 95%   BMI 36.55 kg/m    CC: discuss mood Subjective:    Patient ID: Neil Mcintyre, male    DOB: 1976-01-28, 41 y.o.   MRN: [id number]  HPI: Neil Mcintyre is a 42 y.o. male presenting on 02/21/2017 for Feeling down (Feeling worthless to wife, low sex drive, anxiety. Thinks he is having a mid-life crisis)   See recent phone note.  H/o anxiety and anger management trouble.  1 wk h/o worsening anxiety, worthless, not able to satisfy wife or take care of family. Very limited sex with wife which he finds to be an issue - has discussed with wife. Wanting to buy motorcycle. 1 wk ago started thinking about having affair - this is not like him. Some racing thoughts. Occasional insomnia. Appetite ok. No trouble with energy or with concentration. No psychomotor agitation or retardation. No anhedonia.  Occasional depression but predominantly anxiety attacks during last job at [employer], doing better now.  Denies SI/HI.   Feels relationship with wife is ok.  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   Adopted. Doesn't know family history.   Relevant past medical, surgical, family and social history reviewed and updated as indicated. Interim medical history since our last visit reviewed. Allergies and medications reviewed and updated. Outpatient Medications Prior to Visit  Medication Sig Dispense Refill  . amLODipine (NORVASC) 5 MG tablet Take 1 tablet (5 mg total) by mouth daily. 90 tablet 3  . LORazepam (ATIVAN) 0.5 MG tablet Take 1 tablet (0.5 mg total) by mouth daily as needed. 30 tablet 0  . losartan-hydrochlorothiazide (HYZAAR) 100-25 MG tablet Take 1 tablet by mouth daily. 90 tablet 3  . valACYclovir (VALTREX) 500 MG tablet Take 500 mg by mouth daily.   11  . buPROPion (WELLBUTRIN XL) 150 MG 24 hr tablet Take 150 mg by  mouth daily.     . Potassium 99 MG TABS Take 1 tablet (99 mg total) by mouth daily.     No facility-administered medications prior to visit.      Per HPI unless specifically indicated in ROS section below Review of Systems     Objective:    BP 124/80 (BP Location: Left Arm, Patient Position: Sitting, Cuff Size: Large)   Pulse (!) 102   Temp 97.9 F (36.6 C) (Oral)   Wt 224 lb 12 oz (101.9 kg)   SpO2 95%   BMI 36.55 kg/m   Wt Readings from Last 3 Encounters:  02/21/17 224 lb 12 oz (101.9 kg)  02/05/17 226 lb 8 oz (102.7 kg)  10/30/16 217 lb 8 oz (98.7 kg)    Physical Exam  Constitutional: He appears well-developed and well-nourished. No distress.  Psychiatric: He has a normal mood and affect. His speech is normal and behavior is normal. Judgment and thought content normal. Cognition and memory are normal.  Good eye contact Good insight "I know these feelings are not accurate"  Nursing note and vitals reviewed.      Assessment & Plan:  Over 25 minutes were spent face-to-face with the patient during this encounter and >50% of that time was spent on counseling and coordination of care  Problem List Items Addressed This Visit    Mood disorder ([place]) - Primary    Worsening  symptoms of worthlessness and anxiety over the last few weeks without specific trigger.  Discussed lorazepam use PRN anxiety - he was not aware Rx is ready at pharmacy.  I did suggest decreased wellbutrin to 100mg  daily as this could possibly be affecting anxiety. I recommended counseling referral - he will check with wife on local counselors through her work.  I discussed couples counseling as well as sex counseling.  We discussed stress relieving strategies.  PHQ9 = 12 GAD7 = 14 MDQ screen negative       Other Visit Diagnoses    Need for influenza vaccination       Relevant Orders   Flu Vaccine QUAD 6+ mos PF IM (Fluarix Quad PF) (Completed)       Follow up plan: No Follow-up on file.  Ria Bush, MD

## 2017-02-21 NOTE — Assessment & Plan Note (Signed)
Worsening symptoms of worthlessness and anxiety over the last few weeks without specific trigger.  Discussed lorazepam use PRN anxiety - he was not aware Rx is ready at pharmacy.  I did suggest decreased wellbutrin to 100mg  daily as this could possibly be affecting anxiety. I recommended counseling referral - he will check with wife on local counselors through her work.  I discussed couples counseling as well as sex counseling.  We discussed stress relieving strategies.  PHQ9 = 12 GAD7 = 14 MDQ screen negative

## 2017-02-21 NOTE — Telephone Encounter (Signed)
Will see today.  

## 2017-02-21 NOTE — Telephone Encounter (Signed)
[facility] left vm requesting cb about refill for lorazepam. Ref # [id number].

## 2017-02-21 NOTE — Telephone Encounter (Signed)
Spoke with [facility], requested they cancel lorazepam.

## 2017-05-02 ENCOUNTER — Ambulatory Visit (INDEPENDENT_AMBULATORY_CARE_PROVIDER_SITE_OTHER): Payer: 59 | Admitting: Family Medicine

## 2017-05-02 ENCOUNTER — Encounter: Payer: Self-pay | Admitting: Family Medicine

## 2017-05-02 VITALS — BP 126/80 | HR 93 | Temp 97.8°F | Wt 238.0 lb

## 2017-05-02 DIAGNOSIS — E785 Hyperlipidemia, unspecified: Secondary | ICD-10-CM

## 2017-05-02 DIAGNOSIS — F39 Unspecified mood [affective] disorder: Secondary | ICD-10-CM

## 2017-05-02 DIAGNOSIS — F419 Anxiety disorder, unspecified: Secondary | ICD-10-CM | POA: Diagnosis not present

## 2017-05-02 DIAGNOSIS — I10 Essential (primary) hypertension: Secondary | ICD-10-CM | POA: Diagnosis not present

## 2017-05-02 NOTE — Assessment & Plan Note (Signed)
I asked him to return in 3-4 months for rpt FLP on modified keto diet.

## 2017-05-02 NOTE — Assessment & Plan Note (Addendum)
Unfortunately has had recurrent weight gain over last several months off keto diet. Keto diet previously led to worsening dyslipidemia. Planning to restart diet, encouraged modified keto diet focusing on low carbs, but not as restrictive or high in fats.  New job more active hopeful for increased activity helping weight loss.

## 2017-05-02 NOTE — Assessment & Plan Note (Signed)
Some better. Symptoms improving on lower wellbutrin, PRN lorazepam.

## 2017-05-02 NOTE — Assessment & Plan Note (Signed)
-

## 2017-05-02 NOTE — Assessment & Plan Note (Signed)
Chronic, stable. Continue current regimen. 

## 2017-05-02 NOTE — Patient Instructions (Addendum)
Ok to get back on modified keto diet - focus on low carb portion, watch fats.  Return for fasting labs after 3-4 months on keto diet (lab visit)  Return in 6 months for physical.

## 2017-05-02 NOTE — Progress Notes (Signed)
BP 126/80 (BP Location: Left Arm, Patient Position: Sitting, Cuff Size: Normal)   Pulse 93   Temp 97.8 F (36.6 C) (Oral)   Wt 238 lb (108 kg)   SpO2 94%   BMI 38.71 kg/m    CC: 6 mo f/u visit Subjective:    Patient ID: Neil Mcintyre, male    DOB: 10/29/1975, 41 y.o.   MRN: [id number]  HPI: Neil Mcintyre is a 41 y.o. male presenting on 05/02/2017 for 6 mo follow-up   See prior note for details. Last visit seen here with dx mood disorder NOS. I recommended limited lorazepam use PRN anxiety, rec decreased wellbutrin dose, rec counseling.   14 lb weight gain since he stopped ketodiet 01/2017. ketodiet led to cholesterol level elevation. Not interested in referral to nutrition. Does well with portion sizes. Doesn't overeat. Does well with holiday eating. More active job over the past week. Hopeful this will help.   Denies HA, CP/tightness, SOB, leg swelling.   Relevant past medical, surgical, family and social history reviewed and updated as indicated. Interim medical history since our last visit reviewed. Allergies and medications reviewed and updated. Outpatient Medications Prior to Visit  Medication Sig Dispense Refill  . amLODipine (NORVASC) 5 MG tablet Take 1 tablet (5 mg total) by mouth daily. 90 tablet 3  . buPROPion (WELLBUTRIN SR) 100 MG 12 hr tablet Take 1 tablet (100 mg total) by mouth daily. 30 tablet 6  . LORazepam (ATIVAN) 0.5 MG tablet Take 1 tablet (0.5 mg total) by mouth daily as needed. 30 tablet 0  . losartan-hydrochlorothiazide (HYZAAR) 100-25 MG tablet Take 1 tablet by mouth daily. 90 tablet 3  . valACYclovir (VALTREX) 500 MG tablet Take 500 mg by mouth daily.   11   No facility-administered medications prior to visit.      Per HPI unless specifically indicated in ROS section below Review of Systems     Objective:    BP 126/80 (BP Location: Left Arm, Patient Position: Sitting, Cuff Size: Normal)   Pulse 93   Temp 97.8 F (36.6 C) (Oral)   Wt 238 lb  (108 kg)   SpO2 94%   BMI 38.71 kg/m   Wt Readings from Last 3 Encounters:  05/02/17 238 lb (108 kg)  02/21/17 224 lb 12 oz (101.9 kg)  02/05/17 226 lb 8 oz (102.7 kg)    Physical Exam  Constitutional: He appears well-developed and well-nourished. No distress.  HENT:  Mouth/Throat: Oropharynx is clear and moist. No oropharyngeal exudate.  Cardiovascular: Normal rate, regular rhythm, normal heart sounds and intact distal pulses.  No murmur heard. Pulmonary/Chest: Effort normal and breath sounds normal. No respiratory distress. He has no wheezes. He has no rales.  Musculoskeletal: He exhibits no edema.  Skin: Skin is warm and dry. No rash noted.  Psychiatric: He has a normal mood and affect.  Nursing note and vitals reviewed.  Lab Results  Component Value Date   CHOL 189 02/06/2017   HDL 44.40 02/06/2017   LDLCALC 126 (H) 02/06/2017   TRIG 92.0 02/06/2017   CHOLHDL 4 02/06/2017    Lab Results  Component Value Date   HGBA1C 5.8 10/24/2016    The 10-year ASCVD risk score Mikey Bussing DC Jr., et al., 2013) is: 1.3%   Values used to calculate the score:     Age: 62 years     Sex: Male     Is Non-Hispanic African American: No     Diabetic: No  Tobacco smoker: No     Systolic Blood Pressure: 778 mmHg     Is BP treated: Yes     HDL Cholesterol: 44.4 mg/dL     Total Cholesterol: 189 mg/dL     Assessment & Plan:   Problem List Items Addressed This Visit    Anxiety    PRN lorazepam      Benign essential HTN - Primary    Chronic, stable. Continue current regimen.       Dyslipidemia    I asked him to return in 3-4 months for rpt FLP on modified keto diet.       Mood disorder ([place])    Some better. Symptoms improving on lower wellbutrin, PRN lorazepam.       Severe obesity (BMI 35.0-39.9) with comorbidity ([place])    Unfortunately has had recurrent weight gain over last several months off keto diet. Keto diet previously led to worsening dyslipidemia. Planning to restart  diet, encouraged modified keto diet focusing on low carbs, but not as restrictive or high in fats.  New job more active hopeful for increased activity helping weight loss.           Follow up plan: Return in about 6 months (around 10/30/2017) for annual exam, prior fasting for blood work.  Ria Bush, MD

## 2017-05-30 ENCOUNTER — Other Ambulatory Visit: Payer: Self-pay

## 2017-05-30 NOTE — Telephone Encounter (Signed)
Received faxed rx request for Wellbutrin be sent to [facility].

## 2017-06-01 MED ORDER — BUPROPION HCL ER (SR) 100 MG PO TB12: 100.0000 mg | ORAL_TABLET | Freq: Every day | ORAL | 3 refills | Status: DC

## 2017-06-05 ENCOUNTER — Telehealth: Payer: Self-pay | Admitting: Family Medicine

## 2017-06-05 NOTE — Telephone Encounter (Signed)
plz call to verify with patient what he's currently taking. Last visit we discussed trial lower dose (wellbutrin SER 100mg  once daily in place of wellbutrin XL 150mg  once daily). If he's doing well on current regimen, continue whatever that is. Placed [facility] request in Lisa's box.

## 2017-06-05 NOTE — Telephone Encounter (Signed)
Copied from [place] [id number]. Topic: Quick Communication - See Telephone Encounter >> Jun 05, 2017  4:14 PM Bea Graff, NT wrote: CRM for notification. See Telephone encounter for: Joe from [facility] has a question regarding pts rx for Wellbutrin that was sent over on 06/01/17. Pt was on 150mg  extended release and the rx was sent in for 100mg  12 hr tablet. Did the dr mean to change this and why? CB#: [phone] Ref: [id number]  06/05/17.

## 2017-06-06 ENCOUNTER — Telehealth: Payer: Self-pay | Admitting: Family Medicine

## 2017-06-06 NOTE — Telephone Encounter (Signed)
See other phone note.  plz verify with patient.

## 2017-06-06 NOTE — Telephone Encounter (Signed)
Joe, from [facility] calling to verify that the dose of Wellbutrin that was sent in on 12/3 is the dosage the physician wanted the pt to stay on. Joe, states that the pt was previously on Wellbutrin XL 150mg  24hr tab that was ordered by another provider in 01/2017. Joe states that Wellbutrin 100mg  12hr tablet has been ordered to be taken once a day and with this dosage the medication is usually taken twice a day. Reference #[id number]; [facility] # [phone] and is open from 7a-8pm, M-F, Central time.

## 2017-06-06 NOTE — Telephone Encounter (Signed)
Left message on vm for pt to call back.  Need to confirm with pt if he is doing well with the Wellbutrin 100 mg or did he go back to 150 mg.

## 2017-06-09 ENCOUNTER — Telehealth: Payer: Self-pay

## 2017-06-09 NOTE — Telephone Encounter (Signed)
I spoke with Bridgette at [facility] who said got note that Dr Darnell Level was not pts PCP.  Then I spoke with Shea Stakes pharmacist; Shea Stakes said pt was on bupropion XL 150 mg taking one tab daily. Is it appropriate to change to the 100 mg tab. Shea Stakes request cb after review.

## 2017-06-09 NOTE — Telephone Encounter (Signed)
Copied from [place] [id number]. Topic: General - Other >> Jun 09, 2017 10:29 AM Carolyn Stare wrote:   Pharmacy would like a call back concerning the below med    [phone]   order no [phone]    buPROPion (WELLBUTRIN SR) 100 MG 12 hr tablet

## 2017-06-10 NOTE — Telephone Encounter (Signed)
Spoke with [facility] notifying them to fill the bupropion 100 mg per Dr. Darnell Level and pt.  Says they will finish processing the order and get it out to the pt.

## 2017-06-10 NOTE — Telephone Encounter (Signed)
Left message on vm asking pt to call back.  Need to know if Wellbutrin 100 mg is working for him.

## 2017-06-10 NOTE — Telephone Encounter (Signed)
Patient is calling to discuss his medication- he has not started the reduced dosing- he has just finished the original dosing and was going to switch over. He would like the Rx sent to [facility] for the new dosing.

## 2017-06-10 NOTE — Telephone Encounter (Signed)
Appears med has already been sent to [facility]. pts contact info remains busy.

## 2017-06-11 NOTE — Telephone Encounter (Signed)
Per pt, states he just fininshed 150 mg tabs and is starting 100 mg.  So he asks that the 100 mg tabs be filled. [See TE, 06/09/17].

## 2017-06-11 NOTE — Telephone Encounter (Signed)
This has been taken care of.

## 2017-06-21 ENCOUNTER — Other Ambulatory Visit: Payer: Self-pay | Admitting: Family Medicine

## 2017-08-07 ENCOUNTER — Telehealth: Payer: Self-pay

## 2017-08-07 MED ORDER — LORAZEPAM 0.5 MG PO TABS: 0.5000 mg | ORAL_TABLET | Freq: Every day | ORAL | 0 refills | Status: DC | PRN

## 2017-08-07 NOTE — Telephone Encounter (Signed)
I don't recommend controlled substances through mail order - recommend he continue getting this through local pharmacy. I have sent in locally. plz notify patient. We can discuss at his next visit.

## 2017-08-07 NOTE — Telephone Encounter (Signed)
Received faxed New Rx Request from [facility] stating pt would like to have the lorazepam filled through them.  Last rx:  02/18/17, #30 Last OV:  05/02/17 Next OV:  11/04/17

## 2017-08-11 NOTE — Telephone Encounter (Addendum)
He could pay out of pocket locally (not go through pharmacy). If he doesn't want to do this, we can send in #30 through mail order, but remind him mail order can take weeks to deliver meds to his house so he may have to wait for med more so than local pharmacy. Let me know what he'd like to do.

## 2017-08-11 NOTE — Telephone Encounter (Signed)
Pt said that his insurance does not allow that it go to a local pharmacy. It has to go through the mail order. The insurance will not cover it through local. Please call patient back @ [phone]

## 2017-08-11 NOTE — Telephone Encounter (Signed)
Left message for pt to call back.  Need to relay Dr. G's message.  

## 2017-08-12 ENCOUNTER — Telehealth: Payer: Self-pay | Admitting: Family Medicine

## 2017-08-12 MED ORDER — LORAZEPAM 0.5 MG PO TABS: 0.5000 mg | ORAL_TABLET | Freq: Every day | ORAL | 0 refills | Status: DC | PRN

## 2017-08-12 NOTE — Telephone Encounter (Signed)
Copied from [place] [id number]. Topic: Quick Communication - Rx Refill/Question >> Aug 12, 2017 10:00 AM Cleaster Corin, NT wrote: Medication:  LORazepam (ATIVAN) 0.5 MG tablet [[id number]]    Has the patient contacted their pharmacy? yes   (Agent: If no, request that the patient contact the pharmacy for the refill.)   Preferred Pharmacy (with phone number or street name): [place], [place] [facility] [address] [place] [address] [place] [address] Phone: [phone] Fax: [phone]     Agent: Please be advised that RX refills may take up to 3 business days. We ask that you follow-up with your pharmacy.

## 2017-08-12 NOTE — Telephone Encounter (Signed)
Spoke with pt relaying message per Dr. Darnell Level.  Pt agrees to have 30 day rx sent to mail order and under stands about the delivery time.

## 2017-08-12 NOTE — Telephone Encounter (Signed)
Please see 08/07/17 phone note.

## 2017-08-12 NOTE — Telephone Encounter (Signed)
#  30 E prescribed to mail order. #30 should last more than 1 month as he doesn't take this regularly.

## 2017-08-12 NOTE — Addendum Note (Signed)
Addended by: Ria Bush on: 08/12/2017 01:51 PM   Modules accepted: Orders

## 2017-09-02 ENCOUNTER — Other Ambulatory Visit: Payer: Self-pay | Admitting: Family Medicine

## 2017-09-02 DIAGNOSIS — R7303 Prediabetes: Secondary | ICD-10-CM

## 2017-09-02 DIAGNOSIS — E785 Hyperlipidemia, unspecified: Secondary | ICD-10-CM

## 2017-09-03 ENCOUNTER — Ambulatory Visit (INDEPENDENT_AMBULATORY_CARE_PROVIDER_SITE_OTHER): Payer: BLUE CROSS/BLUE SHIELD | Admitting: Family Medicine

## 2017-09-03 ENCOUNTER — Encounter: Payer: Self-pay | Admitting: Family Medicine

## 2017-09-03 ENCOUNTER — Other Ambulatory Visit (INDEPENDENT_AMBULATORY_CARE_PROVIDER_SITE_OTHER): Payer: Self-pay

## 2017-09-03 DIAGNOSIS — R7303 Prediabetes: Secondary | ICD-10-CM | POA: Diagnosis not present

## 2017-09-03 DIAGNOSIS — E785 Hyperlipidemia, unspecified: Secondary | ICD-10-CM | POA: Diagnosis not present

## 2017-09-03 LAB — LIPID PANEL
CHOL/HDL RATIO: 4
Cholesterol: 237 mg/dL — ABNORMAL HIGH (ref 0–200)
HDL: 52.7 mg/dL (ref >39.00)
LDL Cholesterol: 165 mg/dL — ABNORMAL HIGH (ref 0–99)
NONHDL: 184.08
TRIGLYCERIDES: 95 mg/dL (ref 0.0–149.0)
VLDL: 19 mg/dL (ref 0.0–40.0)

## 2017-09-03 LAB — COMPREHENSIVE METABOLIC PANEL
ALT: 23 U/L (ref 0–53)
AST: 18 U/L (ref 0–37)
Albumin: 4.1 g/dL (ref 3.5–5.2)
Alkaline Phosphatase: 55 U/L (ref 39–117)
BILIRUBIN TOTAL: 1.2 mg/dL (ref 0.2–1.2)
BUN: 19 mg/dL (ref 6–23)
CALCIUM: 9.7 mg/dL (ref 8.4–10.5)
CO2: 31 meq/L (ref 19–32)
CREATININE: 0.84 mg/dL (ref 0.40–1.50)
Chloride: 98 mEq/L (ref 96–112)
GFR: 107.15 mL/min (ref >60.00)
Glucose, Bld: 95 mg/dL (ref 70–99)
Potassium: 4.1 mEq/L (ref 3.5–5.1)
Sodium: 135 mEq/L (ref 135–145)
TOTAL PROTEIN: 7.5 g/dL (ref 6.0–8.3)

## 2017-09-03 LAB — HEMOGLOBIN A1C: Hgb A1c MFr Bld: 5.8 % (ref 4.6–6.5)

## 2017-09-04 ENCOUNTER — Encounter: Payer: Self-pay | Admitting: Family Medicine

## 2017-09-04 ENCOUNTER — Ambulatory Visit: Payer: BLUE CROSS/BLUE SHIELD | Admitting: Family Medicine

## 2017-09-04 VITALS — BP 120/78 | HR 93 | Temp 97.8°F | Wt 230.0 lb

## 2017-09-04 DIAGNOSIS — E785 Hyperlipidemia, unspecified: Secondary | ICD-10-CM | POA: Diagnosis not present

## 2017-09-04 DIAGNOSIS — R3912 Poor urinary stream: Secondary | ICD-10-CM | POA: Insufficient documentation

## 2017-09-04 DIAGNOSIS — R3915 Urgency of urination: Secondary | ICD-10-CM

## 2017-09-04 DIAGNOSIS — F419 Anxiety disorder, unspecified: Secondary | ICD-10-CM | POA: Diagnosis not present

## 2017-09-04 DIAGNOSIS — R7303 Prediabetes: Secondary | ICD-10-CM | POA: Diagnosis not present

## 2017-09-04 LAB — POC URINALSYSI DIPSTICK (AUTOMATED)
BILIRUBIN UA: NEGATIVE
GLUCOSE UA: NEGATIVE
Ketones, UA: NEGATIVE
LEUKOCYTES UA: NEGATIVE
NITRITE UA: NEGATIVE
Protein, UA: NEGATIVE
RBC UA: NEGATIVE
Spec Grav, UA: 1.02 (ref 1.010–1.025)
Urobilinogen, UA: 0.2 E.U./dL
pH, UA: 6 (ref 5.0–8.0)

## 2017-09-04 MED ORDER — MIRABEGRON ER 25 MG PO TB24: 25.0000 mg | ORAL_TABLET | Freq: Every day | ORAL | 1 refills | Status: DC

## 2017-09-04 NOTE — Progress Notes (Signed)
BP 120/78 (BP Location: Left Arm, Patient Position: Sitting, Cuff Size: Normal)   Pulse 93   Temp 97.8 F (36.6 C) (Oral)   Wt 230 lb (104.3 kg)   SpO2 94%   BMI 37.41 kg/m    CC: urinary urgency Subjective:    Patient ID: Neil Mcintyre, male    DOB: 08-Sep-1975, 42 y.o.   MRN: [id number]  HPI: Neil Mcintyre is a 42 y.o. male presenting on 09/04/2017 for Urinary Urgency (Started shortly after GI surgery 08/2015 but now has worsened. Sometimes has trouble getting stream started after having to hold urine for awhile. Then occasionally stream will stop, thinks he is finished and stream starts again.  States urine stream is slow at night.)   2 yr h/o urinary troubles - trouble completely emptying, weak stream, post void dribbling. Urinary urgency when he gets home from work - every day. More trouble when he has to hold urine - weakening of the stream especially noted then.  Usually first AM stream is strong, but subsequent streams weaken. Denies dysuria, hematuria, hesitancy, nocturia.   Drinks ~50 oz water/day.  1 cup coffee in am, drinks 1 monster energy drink a day in preparation for work.  Enjoys spicy foods.   H/o diverticulitis s/p colectomy 2017. He did need bilateral ureteral stent and catheter placement for this surgery Pilar Jarvis).   H/o undescended tail since birth.   Asks about CBD to treat anxiety.   Relevant past medical, surgical, family and social history reviewed and updated as indicated. Interim medical history since our last visit reviewed. Allergies and medications reviewed and updated. Outpatient Medications Prior to Visit  Medication Sig Dispense Refill  . amLODipine (NORVASC) 5 MG tablet Take 1 tablet (5 mg total) by mouth daily. 90 tablet 3  . buPROPion (WELLBUTRIN SR) 100 MG 12 hr tablet Take 1 tablet (100 mg total) by mouth daily. 90 tablet 3  . LORazepam (ATIVAN) 0.5 MG tablet Take 1 tablet (0.5 mg total) by mouth daily as needed. 30 tablet 0  .  losartan-hydrochlorothiazide (HYZAAR) 100-25 MG tablet Take 1 tablet by mouth daily. 90 tablet 3  . valACYclovir (VALTREX) 500 MG tablet TAKE 1 TABLET (500 MG TOTAL) BY MOUTH ONCE DAILY. 30 tablet 3   No facility-administered medications prior to visit.      Per HPI unless specifically indicated in ROS section below Review of Systems     Objective:    BP 120/78 (BP Location: Left Arm, Patient Position: Sitting, Cuff Size: Normal)   Pulse 93   Temp 97.8 F (36.6 C) (Oral)   Wt 230 lb (104.3 kg)   SpO2 94%   BMI 37.41 kg/m   Wt Readings from Last 3 Encounters:  09/04/17 230 lb (104.3 kg)  09/03/17 223 lb (101.2 kg)  05/02/17 238 lb (108 kg)    Physical Exam  Constitutional: He appears well-developed and well-nourished. No distress.  HENT:  Mouth/Throat: Oropharynx is clear and moist. No oropharyngeal exudate.  Abdominal: Soft. Normal appearance and bowel sounds are normal. He exhibits no distension and no mass. There is no hepatosplenomegaly. There is no tenderness. There is no rigidity, no rebound, no guarding, no CVA tenderness and negative Murphy's sign. Hernia confirmed negative in the right inguinal area and confirmed negative in the left inguinal area.  Genitourinary: Rectum normal, testes normal and penis normal. Rectal exam shows no external hemorrhoid, no internal hemorrhoid, no fissure, no mass, no tenderness and anal tone normal. Prostate is enlarged (mildly - ~  25gm). Prostate is not tender. Right testis shows no mass, no swelling and no tenderness. Right testis is descended. Left testis shows no mass, no swelling and no tenderness. Left testis is descended. Circumcised.  Genitourinary Comments: Congenital excess tissue at tailbone  Musculoskeletal: He exhibits no edema.  Lymphadenopathy:       Right: No inguinal adenopathy present.       Left: No inguinal adenopathy present.  Skin: Skin is warm and dry. No rash noted.  Psychiatric: He has a normal mood and affect.    Nursing note and vitals reviewed.  Results for orders placed or performed in visit on 09/04/17  POCT Urinalysis Dipstick (Automated)  Result Value Ref Range   Color, UA yellow    Clarity, UA clear    Glucose, UA negative    Bilirubin, UA negative    Ketones, UA negative    Spec Grav, UA 1.020 1.010 - 1.025   Blood, UA negative    pH, UA 6.0 5.0 - 8.0   Protein, UA negative    Urobilinogen, UA 0.2 0.2 or 1.0 E.U./dL   Nitrite, UA negative    Leukocytes, UA Negative Negative      Assessment & Plan:  Reviewed recent labs with patient. Problem List Items Addressed This Visit    Anxiety    Currently managed with wellbutrin and ativan PRN. Discussed CBD oil, discussed difference between CBD and THC.       Dyslipidemia    Reviewed recent FLP.  The 10-year ASCVD risk score Mikey Bussing DC Brooke Bonito., et al., 2013) is: 1.5%   Values used to calculate the score:     Age: 19 years     Sex: Male     Is Non-Hispanic African American: No     Diabetic: No     Tobacco smoker: No     Systolic Blood Pressure: 409 mmHg     Is BP treated: Yes     HDL Cholesterol: 52.7 mg/dL     Total Cholesterol: 237 mg/dL       Prediabetes    Reviewed with patient yesterday's A1c.       Severe obesity (BMI 35.0-39.9) with comorbidity ([place])    He is planning to restart ketodiet.  comorbidities of HTN, HLD, prediabetes present      Urinary urgency - Primary    Worsening urinary symptoms of urgency, incomplete emptying, weak stream, and dribbling, with mild BPH on exam. UA today normal.  I recommended he keep bladder diary to find triggers for worsening symptoms, discussed avoidance of bladder irritants including monster energy drink and other caffeinated beverages.  Discussed pharmaceutical options - he desires to avoid antimuscarinics due to anticholinergic side effects. Will Rx Myrbetriq 25mg  daily PRN.  Discussed uro referral if no better with above.       Relevant Orders   POCT Urinalysis Dipstick  (Automated) (Completed)       Meds ordered this encounter  Medications  . mirabegron ER (MYRBETRIQ) 25 MG TB24 tablet    Sig: Take 1 tablet (25 mg total) by mouth daily.    Dispense:  30 tablet    Refill:  1   Orders Placed This Encounter  Procedures  . POCT Urinalysis Dipstick (Automated)    Follow up plan: No follow-ups on file.  Ria Bush, MD

## 2017-09-04 NOTE — Addendum Note (Signed)
Addended by: Ria Bush on: 09/04/2017 01:26 PM   Modules accepted: Level of Service

## 2017-09-04 NOTE — Assessment & Plan Note (Signed)
Currently managed with wellbutrin and ativan PRN. Discussed CBD oil, discussed difference between CBD and THC.

## 2017-09-04 NOTE — Assessment & Plan Note (Signed)
Worsening urinary symptoms of urgency, incomplete emptying, weak stream, and dribbling, with mild BPH on exam. UA today normal.  I recommended he keep bladder diary to find triggers for worsening symptoms, discussed avoidance of bladder irritants including monster energy drink and other caffeinated beverages.  Discussed pharmaceutical options - he desires to avoid antimuscarinics due to anticholinergic side effects. Will Rx Myrbetriq 25mg  daily PRN.  Discussed uro referral if no better with above.

## 2017-09-04 NOTE — Assessment & Plan Note (Signed)
Reviewed with patient yesterday's A1c.

## 2017-09-04 NOTE — Assessment & Plan Note (Signed)
Reviewed recent FLP.  The 10-year ASCVD risk score Mikey Bussing DC Brooke Bonito., et al., 2013) is: 1.5%   Values used to calculate the score:     Age: 42 years     Sex: Male     Is Non-Hispanic African American: No     Diabetic: No     Tobacco smoker: No     Systolic Blood Pressure: 343 mmHg     Is BP treated: Yes     HDL Cholesterol: 52.7 mg/dL     Total Cholesterol: 237 mg/dL

## 2017-09-04 NOTE — Patient Instructions (Addendum)
Urine today was ok Keep bladder diary - looking for possible triggers of symptoms. Try to back down on caffeine and spicy foods and other bladder irritants.  Trial myrbetriq 25mg  daily - price out and let me know if unaffordable. Update me with effect in 1 month.

## 2017-09-04 NOTE — Assessment & Plan Note (Addendum)
He is planning to restart ketodiet.  comorbidities of HTN, HLD, prediabetes present

## 2017-09-06 NOTE — Progress Notes (Signed)
appt cancelled

## 2017-10-28 ENCOUNTER — Other Ambulatory Visit: Payer: Self-pay | Admitting: Family Medicine

## 2017-10-28 DIAGNOSIS — R7303 Prediabetes: Secondary | ICD-10-CM

## 2017-10-28 DIAGNOSIS — E785 Hyperlipidemia, unspecified: Secondary | ICD-10-CM

## 2017-10-31 ENCOUNTER — Other Ambulatory Visit: Payer: BLUE CROSS/BLUE SHIELD

## 2017-11-04 ENCOUNTER — Ambulatory Visit (INDEPENDENT_AMBULATORY_CARE_PROVIDER_SITE_OTHER): Payer: PRIVATE HEALTH INSURANCE | Admitting: Family Medicine

## 2017-11-04 ENCOUNTER — Encounter: Payer: Self-pay | Admitting: Family Medicine

## 2017-11-04 VITALS — BP 120/86 | HR 92 | Temp 97.8°F | Ht 66.0 in | Wt 240.5 lb

## 2017-11-04 DIAGNOSIS — R7303 Prediabetes: Secondary | ICD-10-CM

## 2017-11-04 DIAGNOSIS — I10 Essential (primary) hypertension: Secondary | ICD-10-CM

## 2017-11-04 DIAGNOSIS — F419 Anxiety disorder, unspecified: Secondary | ICD-10-CM

## 2017-11-04 DIAGNOSIS — E785 Hyperlipidemia, unspecified: Secondary | ICD-10-CM | POA: Diagnosis not present

## 2017-11-04 DIAGNOSIS — Z0000 Encounter for general adult medical examination without abnormal findings: Secondary | ICD-10-CM

## 2017-11-04 LAB — BASIC METABOLIC PANEL
BUN: 13 mg/dL (ref 6–23)
CALCIUM: 9.4 mg/dL (ref 8.4–10.5)
CO2: 29 mEq/L (ref 19–32)
Chloride: 101 mEq/L (ref 96–112)
Creatinine, Ser: 0.79 mg/dL (ref 0.40–1.50)
GFR: 114.92 mL/min (ref >60.00)
Glucose, Bld: 95 mg/dL (ref 70–99)
POTASSIUM: 3.8 meq/L (ref 3.5–5.1)
Sodium: 139 mEq/L (ref 135–145)

## 2017-11-04 LAB — LIPID PANEL
CHOL/HDL RATIO: 4
Cholesterol: 199 mg/dL (ref 0–200)
HDL: 44.5 mg/dL (ref >39.00)
LDL CALC: 133 mg/dL — AB (ref 0–99)
NonHDL: 154.48
TRIGLYCERIDES: 109 mg/dL (ref 0.0–149.0)
VLDL: 21.8 mg/dL (ref 0.0–40.0)

## 2017-11-04 LAB — TSH: TSH: 1.17 u[IU]/mL (ref 0.35–4.50)

## 2017-11-04 NOTE — Assessment & Plan Note (Signed)
Has tried and failed several diets. Interested in eval by weight management clinic. Referral placed today.

## 2017-11-04 NOTE — Assessment & Plan Note (Signed)
Preventative protocols reviewed and updated unless pt declined. Discussed healthy diet and lifestyle.  

## 2017-11-04 NOTE — Progress Notes (Signed)
BP 120/86 (BP Location: Left Arm, Patient Position: Sitting, Cuff Size: Large)   Pulse 92   Temp 97.8 F (36.6 C) (Oral)   Ht 5\' 6"  (1.676 m)   Wt 240 lb 8 oz (109.1 kg)   SpO2 95%   BMI 38.82 kg/m    CC: CPE Subjective:    Patient ID: Neil Mcintyre, male    DOB: Jan 08, 1976, 42 y.o.   MRN: [id number]  HPI: Neil Mcintyre is a 42 y.o. male presenting on 11/04/2017 for Annual Exam   See prior note for details. Last visit we started myrbetriq for longstanding and worsening urinary urgency, incomplete emptying, weak stream and dribbling in setting of mild BPH with normal UA. He didn't try this - actually urinary symptoms cleared up 1 wk after seen here.   Has started using CBD oil for anxiety and anger - has been able to stop lorazepam. Continues wellbutrin.   Working on [employer] - tries to shoot for 2200 cal range. Off keto diet due to worsening chol levels.   Preventative: Family history not known (adopted) Colonoscopy 09/2014 WNL x diverticulosis ([place]) s/p hemicolectomy for severe diverticulitis. likely good until age 71yo Flu shot yearly Tetanus unsure - to check with employee health Seat belt use discussed Sunscreen use discussed. 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   Relevant past medical, surgical, family and social history reviewed and updated as indicated. Interim medical history since our last visit reviewed. Allergies and medications reviewed and updated. Outpatient Medications Prior to Visit  Medication Sig Dispense Refill  . amLODipine (NORVASC) 5 MG tablet Take 1 tablet (5 mg total) by mouth daily. 90 tablet 3  . buPROPion (WELLBUTRIN SR) 100 MG 12 hr tablet Take 1 tablet (100 mg total) by mouth daily. 90 tablet 3  . losartan-hydrochlorothiazide (HYZAAR) 100-25 MG  tablet Take 1 tablet by mouth daily. 90 tablet 3  . valACYclovir (VALTREX) 500 MG tablet TAKE 1 TABLET (500 MG TOTAL) BY MOUTH ONCE DAILY. 30 tablet 3  . LORazepam (ATIVAN) 0.5 MG tablet Take 1 tablet (0.5 mg total) by mouth daily as needed. 30 tablet 0  . mirabegron ER (MYRBETRIQ) 25 MG TB24 tablet Take 1 tablet (25 mg total) by mouth daily. (Patient not taking: Reported on 11/04/2017) 30 tablet 1   No facility-administered medications prior to visit.      Per HPI unless specifically indicated in ROS section below Review of Systems  Constitutional: Negative for activity change, appetite change, chills, fatigue, fever and unexpected weight change.  HENT: Negative for hearing loss.   Eyes: Negative for visual disturbance.  Respiratory: Negative for cough, chest tightness, shortness of breath and wheezing.   Cardiovascular: Negative for chest pain, palpitations and leg swelling.  Gastrointestinal: Negative for abdominal distention, abdominal pain, blood in stool, constipation, diarrhea, nausea and vomiting.  Genitourinary: Negative for difficulty urinating and hematuria.  Musculoskeletal: Negative for arthralgias, myalgias and neck pain.  Skin: Negative for rash.  Neurological: Negative for dizziness, seizures, syncope and headaches.  Hematological: Negative for adenopathy. Does not bruise/bleed easily.  Psychiatric/Behavioral: Negative for dysphoric mood. The patient is not nervous/anxious.        Objective:    BP 120/86 (BP Location: Left Arm, Patient Position: Sitting, Cuff Size: Large)   Pulse  92   Temp 97.8 F (36.6 C) (Oral)   Ht 5\' 6"  (1.676 m)   Wt 240 lb 8 oz (109.1 kg)   SpO2 95%   BMI 38.82 kg/m   Wt Readings from Last 3 Encounters:  11/04/17 240 lb 8 oz (109.1 kg)  09/04/17 230 lb (104.3 kg)  09/03/17 223 lb (101.2 kg)    Physical Exam  Constitutional: He is oriented to person, place, and time. He appears well-developed and well-nourished. No distress.  HENT:    Head: Normocephalic and atraumatic.  Right Ear: Hearing, tympanic membrane, external ear and ear canal normal.  Left Ear: Hearing, tympanic membrane, external ear and ear canal normal.  Nose: Nose normal.  Mouth/Throat: Uvula is midline, oropharynx is clear and moist and mucous membranes are normal. No oropharyngeal exudate, posterior oropharyngeal edema or posterior oropharyngeal erythema.  Eyes: Pupils are equal, round, and reactive to light. Conjunctivae and EOM are normal. No scleral icterus.  Neck: Normal range of motion. Neck supple. No thyromegaly present.  Cardiovascular: Normal rate, regular rhythm, normal heart sounds and intact distal pulses.  No murmur heard. Pulses:      Radial pulses are 2+ on the right side, and 2+ on the left side.  Pulmonary/Chest: Effort normal and breath sounds normal. No respiratory distress. He has no wheezes. He has no rales.  Abdominal: Soft. Bowel sounds are normal. He exhibits no distension and no mass. There is no tenderness. There is no rebound and no guarding.  Musculoskeletal: Normal range of motion. He exhibits no edema.  Lymphadenopathy:    He has no cervical adenopathy.  Neurological: He is alert and oriented to person, place, and time.  CN grossly intact, station and gait intact  Skin: Skin is warm and dry. No rash noted.  Psychiatric: He has a normal mood and affect. His behavior is normal. Judgment and thought content normal.  Nursing note and vitals reviewed.  Results for orders placed or performed in visit on 09/04/17  POCT Urinalysis Dipstick (Automated)  Result Value Ref Range   Color, UA yellow    Clarity, UA clear    Glucose, UA negative    Bilirubin, UA negative    Ketones, UA negative    Spec Grav, UA 1.020 1.010 - 1.025   Blood, UA negative    pH, UA 6.0 5.0 - 8.0   Protein, UA negative    Urobilinogen, UA 0.2 0.2 or 1.0 E.U./dL   Nitrite, UA negative    Leukocytes, UA Negative Negative   Lab Results  Component  Value Date   HGBA1C 5.8 09/03/2017   Lab Results  Component Value Date   CHOL 237 (H) 09/03/2017   HDL 52.70 09/03/2017   LDLCALC 165 (H) 09/03/2017   TRIG 95.0 09/03/2017   CHOLHDL 4 09/03/2017       Assessment & Plan:   Problem List Items Addressed This Visit    Anxiety    Stable period on wellbutrin, doing well after recent addition of CBD oil. Has been able to stop lorazepam use.       Benign essential HTN    Chronic, stable. Continue current regimen.       Relevant Orders   Amb Ref to Medical Weight Management   Dyslipidemia    ketodiet worsened cholesterol levels. Will recheck FLP off ketodiet for the last 2 months.  The 10-year ASCVD risk score Mikey Bussing DC Jr., et al., 2013) is: 1.5%   Values used to calculate the score:  Age: 18 years     Sex: Male     Is Non-Hispanic African American: No     Diabetic: No     Tobacco smoker: No     Systolic Blood Pressure: 767 mmHg     Is BP treated: Yes     HDL Cholesterol: 52.7 mg/dL     Total Cholesterol: 237 mg/dL       Relevant Orders   Lipid panel   Basic metabolic panel   TSH   Amb Ref to Medical Weight Management   Health maintenance examination - Primary    Preventative protocols reviewed and updated unless pt declined. Discussed healthy diet and lifestyle.       Prediabetes    Chronic, stable.      Severe obesity (BMI 35.0-39.9) with comorbidity ([place])    Has tried and failed several diets. Interested in eval by weight management clinic. Referral placed today.       Relevant Orders   Amb Ref to Medical Weight Management       No orders of the defined types were placed in this encounter.  Orders Placed This Encounter  Procedures  . Lipid panel  . Basic metabolic panel  . TSH  . Amb Ref to Medical Weight Management    Referral Priority:   Routine    Referral Type:   Consultation    Number of Visits Requested:   1    Follow up plan: Return in about 4 months (around 03/06/2018) for follow up  visit.  Ria Bush, MD

## 2017-11-04 NOTE — Assessment & Plan Note (Signed)
Stable period on wellbutrin, doing well after recent addition of CBD oil. Has been able to stop lorazepam use.

## 2017-11-04 NOTE — Assessment & Plan Note (Signed)
ketodiet worsened cholesterol levels. Will recheck FLP off ketodiet for the last 2 months.  The 10-year ASCVD risk score Mikey Bussing DC Brooke Bonito., et al., 2013) is: 1.5%   Values used to calculate the score:     Age: 42 years     Sex: Male     Is Non-Hispanic African American: No     Diabetic: No     Tobacco smoker: No     Systolic Blood Pressure: 818 mmHg     Is BP treated: Yes     HDL Cholesterol: 52.7 mg/dL     Total Cholesterol: 237 mg/dL

## 2017-11-04 NOTE — Patient Instructions (Addendum)
Check with employee health about tetanus status (and if you had Tdap).  Decrease daily calorie goal to 1800 cal/day.  Let's check cholesterol today.  We will refer you to [place] weight [facility] clinic (Dr Leafy Ro) You are doing well today.  Return as needed or in 4 months for follow up visit.   Health Maintenance, Male A healthy lifestyle and preventive care is important for your health and wellness. Ask your health care provider about what schedule of regular examinations is right for you. What should I know about weight and diet? Eat a Healthy Diet  Eat plenty of vegetables, fruits, whole grains, low-fat dairy products, and lean protein.  Do not eat a lot of foods high in solid fats, added sugars, or salt.  Maintain a Healthy Weight Regular exercise can help you achieve or maintain a healthy weight. You should:  Do at least 150 minutes of exercise each week. The exercise should increase your heart rate and make you sweat (moderate-intensity exercise).  Do strength-training exercises at least twice a week.  Watch Your Levels of Cholesterol and Blood Lipids  Have your blood tested for lipids and cholesterol every 5 years starting at 41 years of age. If you are at high risk for heart disease, you should start having your blood tested when you are 41 years old. You may need to have your cholesterol levels checked more often if: ? Your lipid or cholesterol levels are high. ? You are older than 41 years of age. ? You are at high risk for heart disease.  What should I know about cancer screening? Many types of cancers can be detected early and may often be prevented. Lung Cancer  You should be screened every year for lung cancer if: ? You are a current smoker who has smoked for at least 30 years. ? You are a former smoker who has quit within the past 15 years.  Talk to your health care provider about your screening options, when you should start screening, and how often you  should be screened.  Colorectal Cancer  Routine colorectal cancer screening usually begins at 41 years of age and should be repeated every 5-10 years until you are 41 years old. You may need to be screened more often if early forms of precancerous polyps or small growths are found. Your health care provider may recommend screening at an earlier age if you have risk factors for colon cancer.  Your health care provider may recommend using home test kits to check for hidden blood in the stool.  A small camera at the end of a tube can be used to examine your colon (sigmoidoscopy or colonoscopy). This checks for the earliest forms of colorectal cancer.  Prostate and Testicular Cancer  Depending on your age and overall health, your health care provider may do certain tests to screen for prostate and testicular cancer.  Talk to your health care provider about any symptoms or concerns you have about testicular or prostate cancer.  Skin Cancer  Check your skin from head to toe regularly.  Tell your health care provider about any new moles or changes in moles, especially if: ? There is a change in a mole's size, shape, or color. ? You have a mole that is larger than a pencil eraser.  Always use sunscreen. Apply sunscreen liberally and repeat throughout the day.  Protect yourself by wearing long sleeves, pants, a wide-brimmed hat, and sunglasses when outside.  What should I know about heart  disease, diabetes, and high blood pressure?  If you are 8-28 years of age, have your blood pressure checked every 3-5 years. If you are 55 years of age or older, have your blood pressure checked every year. You should have your blood pressure measured twice-once when you are at a hospital or clinic, and once when you are not at a hospital or clinic. Record the average of the two measurements. To check your blood pressure when you are not at a hospital or clinic, you can use: ? An automated blood pressure  machine at a pharmacy. ? A home blood pressure monitor.  Talk to your health care provider about your target blood pressure.  If you are between 46-24 years old, ask your health care provider if you should take aspirin to prevent heart disease.  Have regular diabetes screenings by checking your fasting blood sugar level. ? If you are at a normal weight and have a low risk for diabetes, have this test once every three years after the age of 2. ? If you are overweight and have a high risk for diabetes, consider being tested at a younger age or more often.  A one-time screening for abdominal aortic aneurysm (AAA) by ultrasound is recommended for men aged 48-75 years who are current or former smokers. What should I know about preventing infection? Hepatitis B If you have a higher risk for hepatitis B, you should be screened for this virus. Talk with your health care provider to find out if you are at risk for hepatitis B infection. Hepatitis C Blood testing is recommended for:  Everyone born from [birth year] through 1964.  Anyone with known risk factors for hepatitis C.  Sexually Transmitted Diseases (STDs)  You should be screened each year for STDs including gonorrhea and chlamydia if: ? You are sexually active and are younger than 41 years of age. ? You are older than 41 years of age and your health care provider tells you that you are at risk for this type of infection. ? Your sexual activity has changed since you were last screened and you are at an increased risk for chlamydia or gonorrhea. Ask your health care provider if you are at risk.  Talk with your health care provider about whether you are at high risk of being infected with HIV. Your health care provider may recommend a prescription medicine to help prevent HIV infection.  What else can I do?  Schedule regular health, dental, and eye exams.  Stay current with your vaccines (immunizations).  Do not use any tobacco products,  such as cigarettes, chewing tobacco, and e-cigarettes. If you need help quitting, ask your health care provider.  Limit alcohol intake to no more than 2 drinks per day. One drink equals 12 ounces of beer, 5 ounces of wine, or 1 ounces of hard liquor.  Do not use street drugs.  Do not share needles.  Ask your health care provider for help if you need support or information about quitting drugs.  Tell your health care provider if you often feel depressed.  Tell your health care provider if you have ever been abused or do not feel safe at home. This information is not intended to replace advice given to you by your health care provider. Make sure you discuss any questions you have with your health care provider. Document Released: 11/16/2007 Document Revised: 01/17/2016 Document Reviewed: 02/21/2015 Elsevier Interactive Patient Education  [employer].

## 2017-11-04 NOTE — Assessment & Plan Note (Signed)
Chronic, stable. Continue current regimen. 

## 2017-11-04 NOTE — Assessment & Plan Note (Signed)
Chronic, stable 

## 2018-02-23 ENCOUNTER — Other Ambulatory Visit: Payer: Self-pay | Admitting: Family Medicine

## 2018-02-23 MED ORDER — BUPROPION HCL ER (SR) 100 MG PO TB12: 100.0000 mg | ORAL_TABLET | Freq: Every day | ORAL | 0 refills | Status: DC

## 2018-02-23 MED ORDER — AMLODIPINE BESYLATE 5 MG PO TABS: 5.0000 mg | ORAL_TABLET | Freq: Every day | ORAL | 0 refills | Status: DC

## 2018-02-23 MED ORDER — LOSARTAN POTASSIUM-HCTZ 100-25 MG PO TABS: 1.0000 | ORAL_TABLET | Freq: Every day | ORAL | 0 refills | Status: DC

## 2018-02-23 NOTE — Telephone Encounter (Signed)
Refilled losartan-HCTZ 100-25, wellbutrin SR 100 mg 12 hr tab,and amlodipine 5 mg refilled per protocol [facility]. Pt had annual 11/04/17 and Dr Darnell Level wanted pt to return in 4 mth for FU. Pt voiced understanding and will cb to  Schedule before med is gone.

## 2018-02-23 NOTE — Telephone Encounter (Signed)
LOV 11/04/17 Norvasc and Hyzaar refills have expired. Pt. Needs an OV .

## 2018-02-23 NOTE — Telephone Encounter (Signed)
Copied from [place] [phone]. Topic: Quick Communication - Rx Refill/Question >> Feb 23, 2018 10:29 AM Gardiner Ramus wrote: Medication:amLODipine (NORVASC) 5 MG tablet [[id number]] buPROPion [facility] SR) 100 MG 12 hr tablet [[id number]] losartan-hydrochlorothiazide (HYZAAR) 100-25 MG tablet [[id number]] Changed pharmacy   Has the patient contacted their pharmacy?no Preferred Pharmacy (with phone number or street name):[id number] [place], [place] [phone] (Phone) [phone] (Fax)    Agent: Please be advised that RX refills may take up to 3 business days. We ask that you follow-up with your pharmacy.

## 2018-04-08 ENCOUNTER — Telehealth: Payer: Self-pay

## 2018-04-08 MED ORDER — LOSARTAN POTASSIUM 100 MG PO TABS: 100.0000 mg | ORAL_TABLET | Freq: Every day | ORAL | 0 refills | Status: DC

## 2018-04-08 MED ORDER — HYDROCHLOROTHIAZIDE 25 MG PO TABS: 25.0000 mg | ORAL_TABLET | Freq: Every day | ORAL | 0 refills | Status: DC

## 2018-04-08 NOTE — Telephone Encounter (Signed)
[facility] [place] left v/m requesting separate rx for losartan and HCTZ. Pt is out of med.Please advise.

## 2018-04-08 NOTE — Telephone Encounter (Signed)
Spoke with pt relaying informing pt of backorder and notifying him Dr. Darnell Level sent rxs for the individual meds.  Pt verbalizes understanding.

## 2018-04-08 NOTE — Telephone Encounter (Signed)
Anna at [employer] [employer] said losartan-HCTZ 100-25 is on national back order and Vicente Males request separate rx sent in for losartan 100 mg and HCTZ 25 mg.Please advise. [facility] [place].

## 2018-04-08 NOTE — Telephone Encounter (Signed)
Sent in separate meds. plz notify patient.

## 2018-06-18 ENCOUNTER — Other Ambulatory Visit: Payer: Self-pay | Admitting: Family Medicine

## 2018-06-25 ENCOUNTER — Other Ambulatory Visit: Payer: Self-pay | Admitting: Family Medicine

## 2018-06-29 ENCOUNTER — Other Ambulatory Visit: Payer: Self-pay | Admitting: Family Medicine

## 2018-07-09 ENCOUNTER — Encounter: Payer: Self-pay | Admitting: Emergency Medicine

## 2018-07-09 ENCOUNTER — Emergency Department: Payer: Worker's Compensation

## 2018-07-09 ENCOUNTER — Other Ambulatory Visit: Payer: Self-pay

## 2018-07-09 ENCOUNTER — Emergency Department
Admission: EM | Admit: 2018-07-09 | Discharge: 2018-07-09 | Disposition: A | Discharge status: Home / Self Care | Payer: Worker's Compensation | Attending: Emergency Medicine | Admitting: Emergency Medicine

## 2018-07-09 DIAGNOSIS — Y92230 Patient room in hospital as the place of occurrence of the external cause: Secondary | ICD-10-CM | POA: Insufficient documentation

## 2018-07-09 DIAGNOSIS — S8002XA Contusion of left knee, initial encounter: Secondary | ICD-10-CM | POA: Diagnosis not present

## 2018-07-09 DIAGNOSIS — I10 Essential (primary) hypertension: Secondary | ICD-10-CM | POA: Diagnosis not present

## 2018-07-09 DIAGNOSIS — Z79899 Other long term (current) drug therapy: Secondary | ICD-10-CM | POA: Insufficient documentation

## 2018-07-09 DIAGNOSIS — Z87891 Personal history of nicotine dependence: Secondary | ICD-10-CM | POA: Insufficient documentation

## 2018-07-09 DIAGNOSIS — S0990XA Unspecified injury of head, initial encounter: Secondary | ICD-10-CM | POA: Diagnosis present

## 2018-07-09 DIAGNOSIS — S0081XA Abrasion of other part of head, initial encounter: Secondary | ICD-10-CM | POA: Insufficient documentation

## 2018-07-09 DIAGNOSIS — Y990 Civilian activity done for income or pay: Secondary | ICD-10-CM | POA: Diagnosis not present

## 2018-07-09 DIAGNOSIS — S63501A Unspecified sprain of right wrist, initial encounter: Secondary | ICD-10-CM | POA: Diagnosis not present

## 2018-07-09 DIAGNOSIS — Y9389 Activity, other specified: Secondary | ICD-10-CM | POA: Insufficient documentation

## 2018-07-09 MED ORDER — BACITRACIN-NEOMYCIN-POLYMYXIN 400-5-5000 EX OINT
TOPICAL_OINTMENT | Freq: Once | CUTANEOUS | Status: AC
  Administered 2018-07-09: 19:00:00 via TOPICAL
  Filled 2018-07-09: qty 1

## 2018-07-09 NOTE — ED Notes (Signed)
Pt's head cleaned with soap and water. Neosporin applied to scratches on head.
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
## 2018-07-09 NOTE — ED Notes (Signed)
Workers comp preformed and urine walked to lab placed in the refridegator by this tech.

## 2018-07-09 NOTE — Discharge Instructions (Addendum)
Your exam is normal and your XR & CT scans are negative for any serious injury or trauma. There is no evidence of fracture or brain injury. Keep the abrasions on the scalp clean and covered with antibiotic ointment. Wear the ace bandage on the wrist as needed for support. Take OTC Tylenol and Motrin as needed for pain. Apply ice to any sore muscles or joints. Follow-up with [facility] for further management.

## 2018-09-09 ENCOUNTER — Other Ambulatory Visit: Payer: Self-pay | Admitting: Family Medicine

## 2018-11-24 ENCOUNTER — Telehealth: Payer: Self-pay

## 2018-11-24 MED ORDER — LORAZEPAM 0.5 MG PO TABS: 0.5000 mg | ORAL_TABLET | Freq: Every evening | ORAL | 0 refills | Status: DC | PRN

## 2018-11-24 NOTE — Addendum Note (Signed)
Addended by: Ria Bush on: 11/24/2018 05:56 PM   Modules accepted: Orders

## 2018-11-24 NOTE — Telephone Encounter (Signed)
Sure we can review med changes - would he be able to do a virtual visit today at 4:30pm?  If not then we could try to touch base tomorrow?

## 2018-11-24 NOTE — Telephone Encounter (Signed)
Spoke with pt asking for details of med adjustment request.  Says he is going through a divorce and is having a hard time.  He's more quiet and withdrawn than normal and sometimes feels cold.  Says he is worse at night since he's used to having someone with him.  Does not want to hit "rock bottom".  Pt is requesting adjustment on current meds or resume lorazepam. [Pt gives permission to lvm.]

## 2018-11-24 NOTE — Telephone Encounter (Signed)
Spoke with pt relaying Dr. Synthia Innocent message.  Pt states he has no more money for any visits. Declines to schedule at this time.

## 2018-11-24 NOTE — Telephone Encounter (Signed)
[facility] Night - Client Nonclinical Telephone Record AccessNurse Client [facility] Night - Client Client Site [place] Physician Ria Bush - MD Contact Type Call Who Is Calling Patient / Member / Family / Caregiver Caller Name Lavarius Doughten Caller Phone Number [phone] Patient Name Neil Mcintyre Patient DOB [birth year] Call Type Message Only Information Provided Reason for Call Request for General Office Information Initial Comment Caller states that due to an unfortunate event in his life he would like to make an adjustment to his current medication. States that he was unsure if he needed to be seen for this as he is on anti depressant and would like to adjust the dosage. Caller would like a call back regarding this. Additional Comment Office hours provided. Caller states that he would like a call back after 10am when he awakens. Call Closed By: Arminda Resides Transaction Date/Time: 11/23/2018 5:02:14 PM (ET)

## 2018-11-24 NOTE — Telephone Encounter (Signed)
Spoke with patient.  Doing well on herbalife - has lost weight to 213lbs.  Mainly trouble sleeping due to mind not shutting off.  Stressful time going through divorce.  Ok to restart lorazepam 0.5mg  PRN with sparing use at night.

## 2018-12-18 ENCOUNTER — Other Ambulatory Visit: Payer: Self-pay | Admitting: Family Medicine

## 2018-12-18 NOTE — Telephone Encounter (Signed)
Valtrex Last filled:  10/25/18 Last OV:  11/04/17, CPE Next OV:  none

## 2019-01-01 ENCOUNTER — Other Ambulatory Visit: Payer: Self-pay | Admitting: Family Medicine

## 2019-01-01 NOTE — Telephone Encounter (Signed)
Name of Medication: Lorazepam Name of Pharmacy: [facility] Last Fill or Written Date and Quantity: 11/24/18, #30 Last Office Visit and Type: 11/04/17, CPE Next Office Visit and Type: none Last Controlled Substance Agreement Date: none Last UDS: none

## 2019-01-01 NOTE — Telephone Encounter (Signed)
Eprescribed.

## 2019-02-01 ENCOUNTER — Other Ambulatory Visit: Payer: Self-pay | Admitting: Family Medicine

## 2019-02-01 NOTE — Telephone Encounter (Signed)
LOV 11/04/2017 for CPE, no future appointments on file. Need follow up or CPE?

## 2019-02-02 NOTE — Telephone Encounter (Signed)
Sent in. Asked schedule CPE prior to more refills.

## 2019-02-02 NOTE — Telephone Encounter (Signed)
Please call patient and schedule appointment as instructed. 

## 2019-02-02 NOTE — Telephone Encounter (Signed)
Patient left a voicemail stating that the pharmacy reached out for a refill on his blood pressure medication and they have heard nothing back. Patient stated that he is getting off of work today at 2:00 and would like to pick it up.

## 2019-02-03 NOTE — Telephone Encounter (Signed)
Pt is scheduled for 04/14/19 for labs and 04/21/19 for CPE

## 2019-02-28 ENCOUNTER — Other Ambulatory Visit: Payer: Self-pay | Admitting: Family Medicine

## 2019-03-14 ENCOUNTER — Other Ambulatory Visit: Payer: Self-pay | Admitting: Family Medicine

## 2019-03-15 NOTE — Telephone Encounter (Signed)
ERx 

## 2019-03-15 NOTE — Telephone Encounter (Signed)
Last office visit 11/04/2017 for CPE.  Last refilled 12/14/2018 for #30 with no refills.  CPE scheduled for 04/21/2019.

## 2019-04-06 ENCOUNTER — Other Ambulatory Visit: Payer: Self-pay | Admitting: Family Medicine

## 2019-04-12 ENCOUNTER — Telehealth: Payer: Self-pay

## 2019-04-12 NOTE — Telephone Encounter (Signed)
LVM to call clinic, pt needs COVID screen, front door and back lab info 11.9.2020 TLJ

## 2019-04-13 ENCOUNTER — Other Ambulatory Visit: Payer: Self-pay | Admitting: Family Medicine

## 2019-04-13 DIAGNOSIS — R7303 Prediabetes: Secondary | ICD-10-CM

## 2019-04-13 DIAGNOSIS — E785 Hyperlipidemia, unspecified: Secondary | ICD-10-CM

## 2019-04-14 ENCOUNTER — Other Ambulatory Visit (INDEPENDENT_AMBULATORY_CARE_PROVIDER_SITE_OTHER): Payer: PRIVATE HEALTH INSURANCE

## 2019-04-14 DIAGNOSIS — R7303 Prediabetes: Secondary | ICD-10-CM

## 2019-04-14 DIAGNOSIS — E785 Hyperlipidemia, unspecified: Secondary | ICD-10-CM | POA: Diagnosis not present

## 2019-04-14 LAB — LIPID PANEL
Cholesterol: 221 mg/dL — ABNORMAL HIGH (ref 0–200)
HDL: 54.4 mg/dL (ref >39.00)
LDL Cholesterol: 135 mg/dL — ABNORMAL HIGH (ref 0–99)
NonHDL: 166.49
Total CHOL/HDL Ratio: 4
Triglycerides: 158 mg/dL — ABNORMAL HIGH (ref 0.0–149.0)
VLDL: 31.6 mg/dL (ref 0.0–40.0)

## 2019-04-14 LAB — COMPREHENSIVE METABOLIC PANEL
ALT: 17 U/L (ref 0–53)
AST: 14 U/L (ref 0–37)
Albumin: 4 g/dL (ref 3.5–5.2)
Alkaline Phosphatase: 61 U/L (ref 39–117)
BUN: 14 mg/dL (ref 6–23)
CO2: 31 mEq/L (ref 19–32)
Calcium: 9.4 mg/dL (ref 8.4–10.5)
Chloride: 103 mEq/L (ref 96–112)
Creatinine, Ser: 0.81 mg/dL (ref 0.40–1.50)
GFR: 104.31 mL/min (ref >60.00)
Glucose, Bld: 117 mg/dL — ABNORMAL HIGH (ref 70–99)
Potassium: 4.2 mEq/L (ref 3.5–5.1)
Sodium: 139 mEq/L (ref 135–145)
Total Bilirubin: 0.5 mg/dL (ref 0.2–1.2)
Total Protein: 6.5 g/dL (ref 6.0–8.3)

## 2019-04-14 LAB — HEMOGLOBIN A1C: Hgb A1c MFr Bld: 6.1 % (ref 4.6–6.5)

## 2019-04-21 ENCOUNTER — Telehealth: Payer: Self-pay | Admitting: Family Medicine

## 2019-04-21 ENCOUNTER — Encounter: Payer: PRIVATE HEALTH INSURANCE | Admitting: Family Medicine

## 2019-04-21 NOTE — Telephone Encounter (Signed)
Noted. Thanks. Appreciate this. plz notify labs overall ok, sugar in prediabetes range, cholesterol levels were somewhat elevated work on low chol diet and we will review at [facility].

## 2019-04-21 NOTE — Telephone Encounter (Signed)
Lvm for pt to call back.  Need to relay Dr. G's message.  

## 2019-04-21 NOTE — Telephone Encounter (Signed)
Patient called today to reschedule his CPE He just found out that his daughter has been exposed to covid so he did not want to take a chance.   Lab work has already been done and patient has been rescheduled [employer]

## 2019-04-22 ENCOUNTER — Other Ambulatory Visit: Payer: Self-pay

## 2019-04-22 DIAGNOSIS — Z20822 Contact with and (suspected) exposure to covid-19: Secondary | ICD-10-CM

## 2019-04-22 NOTE — Telephone Encounter (Signed)
Pt notified as instructed by phone.  Verbalizes understanding.  

## 2019-04-25 ENCOUNTER — Telehealth: Payer: Self-pay

## 2019-04-25 NOTE — Telephone Encounter (Signed)
Pt called for covid results. Advised results are not back.

## 2019-04-28 ENCOUNTER — Telehealth: Payer: Self-pay

## 2019-04-28 ENCOUNTER — Other Ambulatory Visit: Payer: Self-pay

## 2019-04-28 ENCOUNTER — Telehealth: Payer: Self-pay | Admitting: Family Medicine

## 2019-04-28 DIAGNOSIS — R05 Cough: Secondary | ICD-10-CM

## 2019-04-28 DIAGNOSIS — R059 Cough, unspecified: Secondary | ICD-10-CM

## 2019-04-28 DIAGNOSIS — Z20822 Contact with and (suspected) exposure to covid-19: Secondary | ICD-10-CM

## 2019-04-28 NOTE — Telephone Encounter (Signed)
Patient calling regarding his covid test that was collected on 04/22/2019. Patient can not return back to work until he has result. Per Orson Slick. At [facility] the patient test is missing and patient need to recollect. Patient has been notified and will contact his provider to see if he is able to do a rapid covid test.

## 2019-04-28 NOTE — Telephone Encounter (Signed)
When did his symptoms start? Covid test ordered for [facility], released.  If he doesn't want to go back to [facility] may need to call [facility] to see if they are offering testing directly at their site.

## 2019-04-28 NOTE — Telephone Encounter (Signed)
Spoke with pt relaying Dr. Synthia Innocent message.  States his sxs started on 04/12/19.  He will contact [facility] to directly to get tested.

## 2019-04-28 NOTE — Telephone Encounter (Signed)
Pt called stating He went to get covid test on 11/19 @ [facility] drive through.  He just found out [facility] lost his test.  He has been out of work 5 days now.  He wanted to know if you could put an order in for him to get covid test done at [facility] directly   Pt stated he has been coughing and stuffy nose.  Pt spouse was in contact with someone covid positive.  He didn't have any information of when she was in contact. She has been cleared to go back to work  Pt [place] will not let him go back to work until he is cleared

## 2019-04-30 ENCOUNTER — Telehealth: Payer: Self-pay | Admitting: *Deleted

## 2019-04-30 ENCOUNTER — Encounter: Payer: Self-pay | Admitting: Family Medicine

## 2019-04-30 LAB — NOVEL CORONAVIRUS, NAA: SARS-CoV-2, NAA: NOT DETECTED

## 2019-04-30 NOTE — Telephone Encounter (Signed)
Kenney Houseman called from [facility] regarding the covid-19 test for this patient from 11/19. She stated that the test was lost and the patient has already submitted another sample and it is being processed now. Will route to LB at [facility] for review.

## 2019-05-01 ENCOUNTER — Encounter: Payer: Self-pay | Admitting: Family Medicine

## 2019-05-20 ENCOUNTER — Other Ambulatory Visit: Payer: Self-pay | Admitting: Family Medicine

## 2019-05-20 NOTE — Telephone Encounter (Signed)
Valtrex Last filled:  04/07/19, #30 Last OV:  11/04/17/20, CPE Next OV:  06/08/19, CPE

## 2019-06-08 ENCOUNTER — Other Ambulatory Visit: Payer: Self-pay

## 2019-06-08 ENCOUNTER — Ambulatory Visit (INDEPENDENT_AMBULATORY_CARE_PROVIDER_SITE_OTHER): Payer: PRIVATE HEALTH INSURANCE | Admitting: Family Medicine

## 2019-06-08 ENCOUNTER — Encounter: Payer: Self-pay | Admitting: Family Medicine

## 2019-06-08 VITALS — BP 122/80 | HR 103 | Temp 97.6°F | Ht 66.0 in | Wt 254.8 lb

## 2019-06-08 DIAGNOSIS — Z23 Encounter for immunization: Secondary | ICD-10-CM | POA: Diagnosis not present

## 2019-06-08 DIAGNOSIS — R7303 Prediabetes: Secondary | ICD-10-CM

## 2019-06-08 DIAGNOSIS — I10 Essential (primary) hypertension: Secondary | ICD-10-CM

## 2019-06-08 DIAGNOSIS — Z0000 Encounter for general adult medical examination without abnormal findings: Secondary | ICD-10-CM | POA: Diagnosis not present

## 2019-06-08 DIAGNOSIS — E785 Hyperlipidemia, unspecified: Secondary | ICD-10-CM

## 2019-06-08 DIAGNOSIS — R3912 Poor urinary stream: Secondary | ICD-10-CM

## 2019-06-08 DIAGNOSIS — F39 Unspecified mood [affective] disorder: Secondary | ICD-10-CM

## 2019-06-08 MED ORDER — BUPROPION HCL ER (SR) 100 MG PO TB12: 100.0000 mg | ORAL_TABLET | Freq: Two times a day (BID) | ORAL | 6 refills | Status: DC

## 2019-06-08 MED ORDER — LOSARTAN POTASSIUM-HCTZ 100-25 MG PO TABS: 1.0000 | ORAL_TABLET | Freq: Every day | ORAL | 3 refills | Status: DC

## 2019-06-08 MED ORDER — AMLODIPINE BESYLATE 5 MG PO TABS: 5.0000 mg | ORAL_TABLET | Freq: Every day | ORAL | 3 refills | Status: DC

## 2019-06-08 MED ORDER — TAMSULOSIN HCL 0.4 MG PO CAPS: 0.4000 mg | ORAL_CAPSULE | Freq: Every day | ORAL | 3 refills | Status: DC

## 2019-06-08 NOTE — Assessment & Plan Note (Signed)
Preventative protocols reviewed and updated unless pt declined. Discussed healthy diet and lifestyle.  

## 2019-06-08 NOTE — Progress Notes (Signed)
This visit was conducted in person.  BP 122/80 (BP Location: Left Arm, Patient Position: Sitting, Cuff Size: Large)   Pulse (!) 103   Temp 97.6 F (36.4 C) (Temporal)   Ht 5\' 6"  (1.676 m)   Wt 254 lb 12.8 oz (115.6 kg)   SpO2 95%   BMI 41.13 kg/m    CC: CPE Subjective:    Patient ID: Neil Mcintyre, male    DOB: 09/18/1975, 44 y.o.   MRN: [id number]  HPI: Neil Mcintyre is a 44 y.o. male presenting on 06/08/2019 for Annual Exam (no new concerns)   Currently undergoing marriage counseling for ongoing marital stressors.   Continues wellbutrin SR 100mg  once daily. Lorazepam PRN stress, sleep.   Weight gain noted. Did go through herbalife. Planning to restart this. Wife is going ketodiet.   Weak urine stream since hemicolectomy. No nocturia. No incontinence. He did have bilateral ureteral stent placement for hemicolectomy. No dysuria, rectal pain or pressure.   Preventative: Family history not known (adopted) Colonoscopy 4/2016WNL x diverticulosis ([place]) s/p hemicolectomy for severe diverticulitis 2017. Flu shot today Tetanusunsure  Seat belt use discussed  Sunscreen use discussed. No changing moles on skin.  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 Interim medical history since our last visit reviewed. Allergies and medications reviewed and updated. Outpatient Medications Prior to Visit  Medication Sig Dispense Refill  . LORazepam (ATIVAN) 0.5 MG tablet TAKE 1 TABLET (0.5 MG TOTAL) BY MOUTH AT BEDTIME AS NEEDED FOR ANXIETY OR SLEEP. 30 tablet 0  . valACYclovir (VALTREX) 500 MG tablet TAKE 1 TABLET BY MOUTH EVERY DAY 30 tablet 3  . amLODipine (NORVASC) 5 MG tablet TAKE 1 TABLET BY MOUTH EVERY DAY  30 tablet 0  . buPROPion (WELLBUTRIN SR) 100 MG 12 hr tablet TAKE 1 TABLET BY MOUTH EVERY DAY 30 tablet 5  . losartan-hydrochlorothiazide (HYZAAR) 100-25 MG tablet TAKE 1 TABLET BY MOUTH EVERY DAY 90 tablet 1  . hydrochlorothiazide (HYDRODIURIL) 25 MG tablet Take 1 tablet (25 mg total) by mouth daily. (Patient not taking: Reported on 06/08/2019) 90 tablet 0  . losartan (COZAAR) 100 MG tablet Take 1 tablet (100 mg total) by mouth daily. (Patient not taking: Reported on 06/08/2019) 90 tablet 0   No facility-administered medications prior to visit.     Per HPI unless specifically indicated in ROS section below Review of Systems  Constitutional: Negative for activity change, appetite change, chills, fatigue, fever and unexpected weight change.  HENT: Negative for hearing loss.   Eyes: Negative for visual disturbance.  Respiratory: Positive for chest tightness (stress related) and shortness of breath. Negative for cough and wheezing.   Cardiovascular: Negative for chest pain, palpitations and leg swelling.  Gastrointestinal: Negative for abdominal distention, abdominal pain, blood in stool, constipation, diarrhea, nausea and vomiting.  Genitourinary: Positive for difficulty urinating. Negative for hematuria.  Musculoskeletal: Negative for arthralgias, myalgias and neck pain.  Skin: Negative for rash.  Neurological: Negative for dizziness, seizures, syncope and headaches.  Hematological: Negative for adenopathy. Does not bruise/bleed easily.  Psychiatric/Behavioral: Positive for dysphoric mood. The patient is nervous/anxious.        Marital stressors   Objective:  BP 122/80 (BP Location: Left Arm, Patient Position: Sitting, Cuff Size: Large)   Pulse (!) 103   Temp 97.6 F (36.4 C) (Temporal)   Ht 5\' 6"  (1.676 m)   Wt 254 lb 12.8 oz (115.6 kg)   SpO2 95%   BMI 41.13 kg/m   Wt Readings from Last 3 Encounters:  06/08/19 254 lb 12.8 oz (115.6 kg)  07/09/18 230 lb (104.3 kg)  11/04/17 240  lb 8 oz (109.1 kg)    Physical Exam Vitals and nursing note reviewed.  Constitutional:      General: He is not in acute distress.    Appearance: Normal appearance. He is well-developed. He is obese. He is not ill-appearing.  HENT:     Head: Normocephalic and atraumatic.     Right Ear: Hearing, tympanic membrane, ear canal and external ear normal.     Left Ear: Hearing, tympanic membrane, ear canal and external ear normal.     Nose: Nose normal.     Mouth/Throat:     Pharynx: Uvula midline. No oropharyngeal exudate or posterior oropharyngeal erythema.  Eyes:     General: No scleral icterus.    Conjunctiva/sclera: Conjunctivae normal.     Pupils: Pupils are equal, round, and reactive to light.  Cardiovascular:     Rate and Rhythm: Normal rate and regular rhythm.     Pulses:          Radial pulses are 2+ on the right side and 2+ on the left side.     Heart sounds: Normal heart sounds. No murmur.  Pulmonary:     Effort: Pulmonary effort is normal. No respiratory distress.     Breath sounds: Normal breath sounds. No wheezing or rales.  Abdominal:     General: Bowel sounds are normal. There is no distension.     Palpations: Abdomen is soft. There is no mass.     Tenderness: There is no abdominal tenderness. There is no guarding or rebound.  Musculoskeletal:        General: Normal range of motion.     Cervical back: Normal range of motion and neck supple.  Lymphadenopathy:     Cervical: No cervical adenopathy.  Skin:    General: Skin is warm and dry.     Findings: No rash.  Neurological:     Mental Status: He is alert and oriented to person, place, and time.     Comments: CN grossly intact, station and gait intact  Psychiatric:        Behavior: Behavior normal.        Thought Content: Thought content normal.        Judgment: Judgment normal.       Results for orders placed or performed in visit on 04/28/19  Novel Coronavirus, NAA ([facility])   Specimen: Nasopharyngeal(NP)  swabs in vial transport medium   NASOPHARYNGE  TESTING  Result Value Ref Range   SARS-CoV-2, NAA Not Detected Not Detected   Assessment & Plan:  This visit occurred during the SARS-CoV-2 public health emergency.  Safety protocols were in place, including screening questions prior to the visit, additional usage of staff PPE, and extensive cleaning of exam room while observing appropriate contact time as indicated for disinfecting solutions.   Problem List Items Addressed This Visit    Weak urinary stream    Ongoing trouble since hemicolectomy - possibly due to BPH (evidence of this on last prostate exam) - will trial flomax x 1 month, update with effect.  Severe obesity (BMI 35.0-39.9) with comorbidity ([place])    Encouraged healthy diet and lifestyle changes to affect sustainable weight loss. He is motivated to restart herbalife meal plan, previous success with this.      Prediabetes    Reviewed A1c, encouraged limiting added sugars in diet.       Mood disorder ([place])    Worsened in setting of marital stress. Support provided. Discussed ongoing counseling, will increase wellbutrin to 100mg  SR BID. RTC 3-4 mo for close f/u. Reviewed risks of increased suicidality with any medication dosage titration.       Health maintenance examination - Primary    Preventative protocols reviewed and updated unless pt declined. Discussed healthy diet and lifestyle.       Dyslipidemia    Chronic, mildly elevated, off medication The 10-year ASCVD risk score Mikey Bussing DC Jr., et al., 2013) is: 1.6%   Values used to calculate the score:     Age: 50 years     Sex: Male     Is Non-Hispanic African American: No     Diabetic: No     Tobacco smoker: No     Systolic Blood Pressure: 123XX123 mmHg     Is BP treated: Yes     HDL Cholesterol: 54.4 mg/dL     Total Cholesterol: 221 mg/dL       Benign essential HTN    Chronic, stable. Continue current regimen.       Relevant Medications    losartan-hydrochlorothiazide (HYZAAR) 100-25 MG tablet   amLODipine (NORVASC) 5 MG tablet    Other Visit Diagnoses    Need for influenza vaccination       Relevant Orders   Flu Vaccine QUAD 6+ mos PF IM (Fluarix Quad PF) (Completed)       Meds ordered this encounter  Medications  . tamsulosin (FLOMAX) 0.4 MG CAPS capsule    Sig: Take 1 capsule (0.4 mg total) by mouth daily.    Dispense:  30 capsule    Refill:  3  . buPROPion (WELLBUTRIN SR) 100 MG 12 hr tablet    Sig: Take 1 tablet (100 mg total) by mouth 2 (two) times daily.    Dispense:  60 tablet    Refill:  6  . losartan-hydrochlorothiazide (HYZAAR) 100-25 MG tablet    Sig: Take 1 tablet by mouth daily.    Dispense:  90 tablet    Refill:  3  . amLODipine (NORVASC) 5 MG tablet    Sig: Take 1 tablet (5 mg total) by mouth daily.    Dispense:  90 tablet    Refill:  3   Orders Placed This Encounter  Procedures  . Flu Vaccine QUAD 6+ mos PF IM (Fluarix Quad PF)   Patient instructions: Trial flomax for 1 month, update me with effect.  Increase wellbutrin to 100mg  twice daily. Restart herbalife.  Return as needed or in 3-4 months for follow up visit.  Flu shot today  Follow up plan: Return in about 3 months (around 09/06/2019) for follow up visit.  Ria Bush, MD

## 2019-06-08 NOTE — Patient Instructions (Addendum)
Trial flomax for 1 month, update me with effect.  Increase wellbutrin to 100mg  twice daily. Restart herbalife.  Return as needed or in 3-4 months for follow up visit.  Flu shot today  Health Maintenance, Male Adopting a healthy lifestyle and getting preventive care are important in promoting health and wellness. Ask your health care provider about:  The right schedule for you to have regular tests and exams.  Things you can do on your own to prevent diseases and keep yourself healthy. What should I know about diet, weight, and exercise? Eat a healthy diet   Eat a diet that includes plenty of vegetables, fruits, low-fat dairy products, and lean protein.  Do not eat a lot of foods that are high in solid fats, added sugars, or sodium. Maintain a healthy weight Body mass index (BMI) is a measurement that can be used to identify possible weight problems. It estimates body fat based on height and weight. Your health care provider can help determine your BMI and help you achieve or maintain a healthy weight. Get regular exercise Get regular exercise. This is one of the most important things you can do for your health. Most adults should:  Exercise for at least 150 minutes each week. The exercise should increase your heart rate and make you sweat (moderate-intensity exercise).  Do strengthening exercises at least twice a week. This is in addition to the moderate-intensity exercise.  Spend less time sitting. Even light physical activity can be beneficial. Watch cholesterol and blood lipids Have your blood tested for lipids and cholesterol at 43 years of age, then have this test every 5 years. You may need to have your cholesterol levels checked more often if:  Your lipid or cholesterol levels are high.  You are older than 43 years of age.  You are at high risk for heart disease. What should I know about cancer screening? Many types of cancers can be detected early and may often be  prevented. Depending on your health history and family history, you may need to have cancer screening at various ages. This may include screening for:  Colorectal cancer.  Prostate cancer.  Skin cancer.  Lung cancer. What should I know about heart disease, diabetes, and high blood pressure? Blood pressure and heart disease  High blood pressure causes heart disease and increases the risk of stroke. This is more likely to develop in people who have high blood pressure readings, are of African descent, or are overweight.  Talk with your health care provider about your target blood pressure readings.  Have your blood pressure checked: ? Every 3-5 years if you are 6-15 years of age. ? Every year if you are 71 years old or older.  If you are between the ages of 13 and 53 and are a current or former smoker, ask your health care provider if you should have a one-time screening for abdominal aortic aneurysm (AAA). Diabetes Have regular diabetes screenings. This checks your fasting blood sugar level. Have the screening done:  Once every three years after age 21 if you are at a normal weight and have a low risk for diabetes.  More often and at a younger age if you are overweight or have a high risk for diabetes. What should I know about preventing infection? Hepatitis B If you have a higher risk for hepatitis B, you should be screened for this virus. Talk with your health care provider to find out if you are at risk for hepatitis B  infection. Hepatitis C Blood testing is recommended for:  Everyone born from [birth year] through 1964.  Anyone with known risk factors for hepatitis C. Sexually transmitted infections (STIs)  You should be screened each year for STIs, including gonorrhea and chlamydia, if: ? You are sexually active and are younger than 43 years of age. ? You are older than 43 years of age and your health care provider tells you that you are at risk for this type of  infection. ? Your sexual activity has changed since you were last screened, and you are at increased risk for chlamydia or gonorrhea. Ask your health care provider if you are at risk.  Ask your health care provider about whether you are at high risk for HIV. Your health care provider may recommend a prescription medicine to help prevent HIV infection. If you choose to take medicine to prevent HIV, you should first get tested for HIV. You should then be tested every 3 months for as long as you are taking the medicine. Follow these instructions at home: Lifestyle  Do not use any products that contain nicotine or tobacco, such as cigarettes, e-cigarettes, and chewing tobacco. If you need help quitting, ask your health care provider.  Do not use street drugs.  Do not share needles.  Ask your health care provider for help if you need support or information about quitting drugs. Alcohol use  Do not drink alcohol if your health care provider tells you not to drink.  If you drink alcohol: ? Limit how much you have to 0-2 drinks a day. ? Be aware of how much alcohol is in your drink. In the [place] one drink equals one 12 oz bottle of beer (355 mL), one 5 oz glass of wine (148 mL), or one 1 oz glass of hard liquor (44 mL). General instructions  Schedule regular health, dental, and eye exams.  Stay current with your vaccines.  Tell your health care provider if: ? You often feel depressed. ? You have ever been abused or do not feel safe at home. Summary  Adopting a healthy lifestyle and getting preventive care are important in promoting health and wellness.  Follow your health care provider's instructions about healthy diet, exercising, and getting tested or screened for diseases.  Follow your health care provider's instructions on monitoring your cholesterol and blood pressure. This information is not intended to replace advice given to you by your health care provider. Make sure you  discuss any questions you have with your health care provider. Document Revised: 05/13/2018 Document Reviewed: 05/13/2018 Elsevier Patient Education  2020 [employer].

## 2019-06-09 ENCOUNTER — Encounter: Payer: Self-pay | Admitting: Family Medicine

## 2019-06-09 DIAGNOSIS — A6000 Herpesviral infection of urogenital system, unspecified: Secondary | ICD-10-CM | POA: Insufficient documentation

## 2019-06-09 NOTE — Assessment & Plan Note (Signed)
Chronic, stable. Continue current regimen. 

## 2019-06-09 NOTE — Assessment & Plan Note (Addendum)
Encouraged healthy diet and lifestyle changes to affect sustainable weight loss. He is motivated to restart herbalife meal plan, previous success with this.

## 2019-06-09 NOTE — Assessment & Plan Note (Signed)
Ongoing trouble since hemicolectomy - possibly due to BPH (evidence of this on last prostate exam) - will trial flomax x 1 month, update with effect.

## 2019-06-09 NOTE — Assessment & Plan Note (Signed)
Reviewed A1c, encouraged limiting added sugars in diet.

## 2019-06-09 NOTE — Assessment & Plan Note (Signed)
Chronic, mildly elevated, off medication The 10-year ASCVD risk score Mikey Bussing DC Jr., et al., 2013) is: 1.6%   Values used to calculate the score:     Age: 44 years     Sex: Male     Is Non-Hispanic African American: No     Diabetic: No     Tobacco smoker: No     Systolic Blood Pressure: 123XX123 mmHg     Is BP treated: Yes     HDL Cholesterol: 54.4 mg/dL     Total Cholesterol: 221 mg/dL

## 2019-06-09 NOTE — Assessment & Plan Note (Addendum)
Worsened in setting of marital stress. Support provided. Discussed ongoing counseling, will increase wellbutrin to 100mg  SR BID. RTC 3-4 mo for close f/u. Reviewed risks of increased suicidality with any medication dosage titration.

## 2019-06-11 ENCOUNTER — Encounter: Payer: Self-pay | Admitting: Family Medicine

## 2019-06-27 ENCOUNTER — Other Ambulatory Visit: Payer: Self-pay | Admitting: Family Medicine

## 2019-07-05 ENCOUNTER — Other Ambulatory Visit: Payer: Self-pay | Admitting: Family Medicine

## 2019-07-05 NOTE — Telephone Encounter (Signed)
Rx was last refilled on 03/15/19 for #30 with 0 refills. Patient was last seen on 06/08/19 for CPE.  Is this ok to refill?

## 2019-07-07 NOTE — Telephone Encounter (Signed)
ERx 

## 2019-07-18 ENCOUNTER — Encounter: Payer: Self-pay | Admitting: Family Medicine

## 2019-07-19 ENCOUNTER — Ambulatory Visit: Payer: PRIVATE HEALTH INSURANCE | Attending: Internal Medicine

## 2019-07-19 DIAGNOSIS — Z20822 Contact with and (suspected) exposure to covid-19: Secondary | ICD-10-CM

## 2019-07-19 NOTE — Telephone Encounter (Signed)
Please call: I recommend you get tested for covid to ensure it is not that. Can you go to [facility] today for the test? Their number is: [phone] and you can register online for the test at [email or web address].  I would recommend you go there now as I see there are several openings available (they close at 3:30pm).

## 2019-07-19 NOTE — Telephone Encounter (Signed)
Patient left a voicemail wanting to make sure that Dr. Danise Mina saw this mychart message and responded to him. Patient requested some advice from Dr. Danise Mina.

## 2019-07-20 ENCOUNTER — Encounter: Payer: Self-pay | Admitting: Family Medicine

## 2019-07-20 LAB — NOVEL CORONAVIRUS, NAA: SARS-CoV-2, NAA: NOT DETECTED

## 2019-07-20 NOTE — Telephone Encounter (Signed)
plz schedule pt for virtual visit tomorrow - can we do 11:30am (I'm cancelling Mrs White's appt) or may place in 15 min slot.

## 2019-07-21 NOTE — Telephone Encounter (Signed)
Pt has [facility] scheduled for tomorrow at 9:30.

## 2019-07-21 NOTE — Telephone Encounter (Signed)
Left message for patient to call back  Need to schedule virtual visit

## 2019-07-22 ENCOUNTER — Ambulatory Visit (INDEPENDENT_AMBULATORY_CARE_PROVIDER_SITE_OTHER): Payer: PRIVATE HEALTH INSURANCE | Admitting: Family Medicine

## 2019-07-22 ENCOUNTER — Other Ambulatory Visit: Payer: Self-pay

## 2019-07-22 ENCOUNTER — Encounter: Payer: Self-pay | Admitting: Family Medicine

## 2019-07-22 DIAGNOSIS — R05 Cough: Secondary | ICD-10-CM

## 2019-07-22 DIAGNOSIS — R059 Cough, unspecified: Secondary | ICD-10-CM | POA: Insufficient documentation

## 2019-07-22 MED ORDER — BENZONATATE 100 MG PO CAPS: 100.0000 mg | ORAL_CAPSULE | Freq: Three times a day (TID) | ORAL | 0 refills | Status: DC | PRN

## 2019-07-22 MED ORDER — AZITHROMYCIN 250 MG PO TABS: ORAL_TABLET | ORAL | 0 refills | Status: DC

## 2019-07-22 NOTE — Progress Notes (Addendum)
Virtual visit completed through [facility]. Due to national recommendations of social distancing due to COVID-19, a virtual visit is felt to be most appropriate for this patient at this time. Reviewed limitations of a virtual visit.   Patient location: at work Provider location: at home If any vitals were documented, they were collected by patient at home unless specified below.    Temp 97.7 F (36.5 C) (Other (Comment)) Comment (Src): Infrared scanner  Ht 5\' 6"  (1.676 m)   Wt 254 lb 12.8 oz (115.6 kg)   BMI 41.13 kg/m    CC: cough Subjective:    Patient ID: Neil Mcintyre, male    DOB: 28-Sep-1975, 44 y.o.   MRN: [id number]  HPI: KUNIO BRACKENS is a 44 y.o. male presenting on 07/22/2019 for Cough (pt c/o cough x 2 weeks. tested negative for covid. Pt was exposed to this daughter 2 weeks ago who was positive. Pt c/o cough with a little check congestion, mostly head congestion, some sore throat and hoarseness. Pt denies SOB, CP and tightness. Using Nyquil severe cold with vicks -- this seems to work great.)   See recent [employer].  Recent dry cough that started about 1.5 weeks ago (mildly productive in the morning) associated with hoarse voice, rhinorrhea, nasal congestion. No head or chest congestion.  No fevers/chills, loss of taste or smell, abd dyspnea, pain, nausea, diarrhea, ear or tooth pain.  Daughter and wife with similar symptoms. He has been using vicks nyquil with benefit. He is also taking zinc and vit C and 1 a day MVI No h/o asthma, no smokers at home.   He tested negative for covid infection on 07/20/2019.  Daughter and wife also tested negative for covid infection.      Relevant past medical, surgical, family and social history reviewed and updated as indicated. Interim medical history since our last visit reviewed. Allergies and medications reviewed and updated. Outpatient Medications Prior to Visit  Medication Sig Dispense Refill  . amLODipine (NORVASC) 5 MG  tablet Take 1 tablet (5 mg total) by mouth daily. 90 tablet 3  . buPROPion (WELLBUTRIN SR) 100 MG 12 hr tablet Take 1 tablet (100 mg total) by mouth 2 (two) times daily. 60 tablet 6  . LORazepam (ATIVAN) 0.5 MG tablet TAKE 1 TABLET (0.5 MG TOTAL) BY MOUTH AT BEDTIME AS NEEDED FOR ANXIETY OR SLEEP. 30 tablet 0  . losartan-hydrochlorothiazide (HYZAAR) 100-25 MG tablet Take 1 tablet by mouth daily. 90 tablet 3  . tamsulosin (FLOMAX) 0.4 MG CAPS capsule Take 1 capsule (0.4 mg total) by mouth daily. 30 capsule 3  . valACYclovir (VALTREX) 500 MG tablet TAKE 1 TABLET BY MOUTH EVERY DAY 30 tablet 3   No facility-administered medications prior to visit.     Per HPI unless specifically indicated in ROS section below Review of Systems Objective:    Temp 97.7 F (36.5 C) (Other (Comment)) Comment (Src): Infrared scanner  Ht 5\' 6"  (1.676 m)   Wt 254 lb 12.8 oz (115.6 kg)   BMI 41.13 kg/m   Wt Readings from Last 3 Encounters:  07/22/19 254 lb 12.8 oz (115.6 kg)  06/08/19 254 lb 12.8 oz (115.6 kg)  07/09/18 230 lb (104.3 kg)     Physical exam: Gen: alert, NAD, not ill appearing Pulm: speaks in complete sentences without increased work of breathing Psych: normal mood, normal thought content      Results for orders placed or performed in visit on 07/19/19  Novel Coronavirus, NAA (  [facility])   Specimen: Nasopharyngeal(NP) swabs in vial transport medium   NASOPHARYNGE  TESTING  Result Value Ref Range   SARS-CoV-2, NAA Not Detected Not Detected   Assessment & Plan:   Problem List Items Addressed This Visit    Cough    Cough present for the last 10+ days associated with URI symptoms.  Will cover for bacterial/atypical bronchitis with zpack course.  Tessalon perls for cough.  He and entire family have tested negative for covid.           Meds ordered this encounter  Medications  . azithromycin (ZITHROMAX) 250 MG tablet    Sig: Take two tablets on day one followed by one tablet on days  2-5    Dispense:  6 each    Refill:  0  . benzonatate (TESSALON) 100 MG capsule    Sig: Take 1 capsule (100 mg total) by mouth 3 (three) times daily as needed for cough.    Dispense:  30 capsule    Refill:  0   No orders of the defined types were placed in this encounter.   I discussed the assessment and treatment plan with the patient. The patient was provided an opportunity to ask questions and all were answered. The patient agreed with the plan and demonstrated an understanding of the instructions. The patient was advised to call back or seek an in-person evaluation if the symptoms worsen or if the condition fails to improve as anticipated.  Follow up plan: No follow-ups on file.  Ria Bush, MD

## 2019-07-22 NOTE — Assessment & Plan Note (Signed)
Cough present for the last 10+ days associated with URI symptoms.  Will cover for bacterial/atypical bronchitis with zpack course.  Tessalon perls for cough.  He and entire family have tested negative for covid.

## 2019-08-02 ENCOUNTER — Other Ambulatory Visit: Payer: Self-pay | Admitting: Family Medicine

## 2019-08-03 NOTE — Telephone Encounter (Signed)
[place] Night - Client TELEPHONE ADVICE RECORD [facility] Patient Name: [name] Gender: Male DOB: Nov 29, 1975 Age: 44 Y 79 M 4 D Return Phone Number: [phone] (Primary) Address: City/State/Zip: [place] [address] Client [facility] Night - Client Client Site [place] Physician Ria Bush - MD Contact Type Call Who Is Calling Patient / Member / Family / Caregiver Call Type Triage / Clinical Relationship To Patient Self Return Phone Number [phone] (Primary) Chief Complaint Prescription Refill or Medication Request (non symptomatic) Reason for Call Medication Question / Request Initial Comment Caller states that he needs a refill for [phone]. He states that he has been out of it for a month and has been trying to get someone to send in the refill. Translation No Nurse Assessment Nurse: Asa Lente, RN, Belenda Cruise Date/Time (Eastern Time): 08/02/2019 8:10:26 PM Confirm and document reason for call. If symptomatic, describe symptoms. ---[facility] [phone]. no new or worsening symptoms. Has been out of medication for a month, please send RX Has the patient had close contact with a person known or suspected to have the novel coronavirus illness OR traveled / lives in area with major community spread (including international travel) in the last 14 days from the onset of symptoms? * If Asymptomatic, screen for exposure and travel within the last 14 days. ---No Does the patient have any new or worsening symptoms? ---No Please document clinical information provided and list any resource used. ---[phone]. Patient does not remember the strength. Guidelines Guideline Title Affirmed Question Affirmed Notes Nurse Date/Time (Eastern Time) Disp. Time Eilene Ghazi Time) Disposition Final User 08/02/2019 8:13:32 PM  Clinical Call Yes Asa Lente, RN, Belenda Cruise

## 2019-08-03 NOTE — Telephone Encounter (Signed)
Spoke with pt notifying him refill was sent.  Says he was aware.

## 2019-08-03 NOTE — Telephone Encounter (Signed)
plz notify pt I've refilled amlodipine. We never received request until now.  Also, I sent in 1 year supply to [facility] at physical 06/2019 - not sure why he was told it wasn't available.

## 2019-08-03 NOTE — Telephone Encounter (Signed)
Pt called wanting to see if he could get is rx refilled.  He has been out  For a month and [facility]  He stated he has been calling every week to get rx   [facility]  Please advise when called in [phone]

## 2019-08-10 ENCOUNTER — Encounter: Payer: Self-pay | Admitting: Family Medicine

## 2019-08-13 MED ORDER — ALBUTEROL SULFATE HFA 108 (90 BASE) MCG/ACT IN AERS: 2.0000 | INHALATION_SPRAY | Freq: Four times a day (QID) | RESPIRATORY_TRACT | 1 refills | Status: DC | PRN

## 2019-09-07 ENCOUNTER — Other Ambulatory Visit: Payer: Self-pay

## 2019-09-07 ENCOUNTER — Encounter: Payer: Self-pay | Admitting: Family Medicine

## 2019-09-07 ENCOUNTER — Ambulatory Visit: Payer: PRIVATE HEALTH INSURANCE | Admitting: Family Medicine

## 2019-09-07 ENCOUNTER — Ambulatory Visit (INDEPENDENT_AMBULATORY_CARE_PROVIDER_SITE_OTHER): Payer: PRIVATE HEALTH INSURANCE | Admitting: Family Medicine

## 2019-09-07 VITALS — BP 132/78 | HR 101 | Temp 97.7°F | Ht 66.0 in | Wt 252.1 lb

## 2019-09-07 DIAGNOSIS — J3089 Other allergic rhinitis: Secondary | ICD-10-CM

## 2019-09-07 DIAGNOSIS — F39 Unspecified mood [affective] disorder: Secondary | ICD-10-CM | POA: Diagnosis not present

## 2019-09-07 DIAGNOSIS — J302 Other seasonal allergic rhinitis: Secondary | ICD-10-CM | POA: Insufficient documentation

## 2019-09-07 DIAGNOSIS — J351 Hypertrophy of tonsils: Secondary | ICD-10-CM | POA: Insufficient documentation

## 2019-09-07 MED ORDER — BUPROPION HCL ER (XL) 150 MG PO TB24: 150.0000 mg | ORAL_TABLET | Freq: Every day | ORAL | 6 refills | Status: DC

## 2019-09-07 NOTE — Patient Instructions (Addendum)
Buy some nasal saline irrigation to carry with you.  May use neti pot at home.  Try flonase nasal spray 1-2 squirts in each nostril daily.  Trial wellbutrin XL 150mg  daily in place of SR formulation.  Return in 3 months for follow up. Let me know sooner if any trouble.

## 2019-09-07 NOTE — Assessment & Plan Note (Signed)
Has forgotten to take 2nd wellbutrin dose. Stable GAD7, slightly improved PHQ9. Will trial once daily wellbutrin 150 mg XL. Update with effect through mychart, RTC 3 mo f/u visit.

## 2019-09-07 NOTE — Assessment & Plan Note (Signed)
Incidentally noted R tonsillar enlargement without signs of mass or infection, will watch for now.

## 2019-09-07 NOTE — Assessment & Plan Note (Signed)
Reviewed allergen avoidance measures. rec nasal saline, continue neti pot use, discussed intranasal steroid use with precautions.

## 2019-09-07 NOTE — Progress Notes (Signed)
This visit was conducted in person.  BP 132/78 (BP Location: Left Arm, Patient Position: Sitting, Cuff Size: Large)   Pulse (!) 101   Temp 97.7 F (36.5 C) (Temporal)   Ht 5\' 6"  (1.676 m)   Wt 252 lb 1 oz (114.3 kg)   SpO2 93%   BMI 40.68 kg/m    CC: 3 mo mood f/u visit  Subjective:    Patient ID: Neil Mcintyre, male    DOB: Sep 05, 1975, 44 y.o.   MRN: [id number]  HPI: Neil Mcintyre is a 44 y.o. male presenting on 09/07/2019 for Follow-up (Here for 3 mo mood f/u.)   Continues wellbutrin SR 100mg  bid - increased dose 06/2019, continues lorazepam PRN sleep, stress.  Continues marriage counseling  Seasonal allergies flaring up - fresh cut grass. Itchy dry eyes, congestion. Allegra-D helped. Has not used INS.      Relevant past medical, surgical, family and social history reviewed and updated as indicated. Interim medical history since our last visit reviewed. Allergies and medications reviewed and updated. Outpatient Medications Prior to Visit  Medication Sig Dispense Refill  . amLODipine (NORVASC) 5 MG tablet TAKE 1 TABLET BY MOUTH EVERY DAY 90 tablet 3  . LORazepam (ATIVAN) 0.5 MG tablet TAKE 1 TABLET (0.5 MG TOTAL) BY MOUTH AT BEDTIME AS NEEDED FOR ANXIETY OR SLEEP. 30 tablet 0  . losartan-hydrochlorothiazide (HYZAAR) 100-25 MG tablet Take 1 tablet by mouth daily. 90 tablet 3  . tamsulosin (FLOMAX) 0.4 MG CAPS capsule Take 1 capsule (0.4 mg total) by mouth daily. 30 capsule 3  . valACYclovir (VALTREX) 500 MG tablet TAKE 1 TABLET BY MOUTH EVERY DAY 30 tablet 3  . buPROPion (WELLBUTRIN SR) 100 MG 12 hr tablet Take 1 tablet (100 mg total) by mouth 2 (two) times daily. 60 tablet 6  . albuterol (VENTOLIN HFA) 108 (90 Base) MCG/ACT inhaler Inhale 2 puffs into the lungs every 6 (six) hours as needed for wheezing or shortness of breath. 18 g 1  . azithromycin (ZITHROMAX) 250 MG tablet Take two tablets on day one followed by one tablet on days 2-5 6 each 0  . benzonatate (TESSALON) 100  MG capsule Take 1 capsule (100 mg total) by mouth 3 (three) times daily as needed for cough. 30 capsule 0   No facility-administered medications prior to visit.     Per HPI unless specifically indicated in ROS section below Review of Systems Objective:    BP 132/78 (BP Location: Left Arm, Patient Position: Sitting, Cuff Size: Large)   Pulse (!) 101   Temp 97.7 F (36.5 C) (Temporal)   Ht 5\' 6"  (1.676 m)   Wt 252 lb 1 oz (114.3 kg)   SpO2 93%   BMI 40.68 kg/m   Wt Readings from Last 3 Encounters:  09/07/19 252 lb 1 oz (114.3 kg)  07/22/19 254 lb 12.8 oz (115.6 kg)  06/08/19 254 lb 12.8 oz (115.6 kg)    Physical Exam Vitals and nursing note reviewed.  Constitutional:      Appearance: Normal appearance. He is obese. He is not ill-appearing.  HENT:     Right Ear: Hearing, tympanic membrane, ear canal and external ear normal.     Left Ear: Hearing, tympanic membrane, ear canal and external ear normal.     Ears:     Comments: Cerumen in canals    Nose: Mucosal edema and congestion present.     Right Turbinates: Not enlarged.     Left Turbinates: Not enlarged.  Mouth/Throat:     Mouth: Mucous membranes are moist.     Pharynx: Oropharynx is clear. No oropharyngeal exudate or posterior oropharyngeal erythema.     Tonsils: 2+ on the right. 0 on the left.     Comments: R tonsillar enlargement without exudate or erythema Eyes:     Extraocular Movements: Extraocular movements intact.     Pupils: Pupils are equal, round, and reactive to light.  Cardiovascular:     Rate and Rhythm: Normal rate and regular rhythm.     Pulses: Normal pulses.     Heart sounds: Normal heart sounds. No murmur.  Pulmonary:     Effort: Pulmonary effort is normal. No respiratory distress.     Breath sounds: Normal breath sounds. No wheezing, rhonchi or rales.  Musculoskeletal:     Cervical back: Normal range of motion and neck supple. No rigidity.  Lymphadenopathy:     Head:     Right side of head:  No submental, submandibular, tonsillar, preauricular or posterior auricular adenopathy.     Left side of head: No submental, submandibular, tonsillar, preauricular or posterior auricular adenopathy.     Cervical: No cervical adenopathy.  Neurological:     Mental Status: He is alert.       Results for orders placed or performed in visit on 07/19/19  Novel Coronavirus, NAA ([facility])   Specimen: Nasopharyngeal(NP) swabs in vial transport medium   NASOPHARYNGE  TESTING  Result Value Ref Range   SARS-CoV-2, NAA Not Detected Not Detected   Depression screen [facility] 2/9 09/07/2019 06/08/2019 11/04/2017 02/21/2017 02/21/2017  Decreased Interest 0 0 0 1 1  Down, Depressed, Hopeless 3 3 0 3 3  PHQ - 2 Score 3 3 0 4 4  Altered sleeping 0 0 - 2 2  Tired, decreased energy 0 0 - 0 0  Change in appetite 0 3 - 0 0  Feeling bad or failure about yourself  3 3 - 3 3  Trouble concentrating 0 1 - 2 2  Moving slowly or fidgety/restless 0 0 - 1 1  Suicidal thoughts 1 1 - 0 0  PHQ-9 Score 7 11 - 12 12  Difficult doing work/chores - Not difficult at all - - -    GAD 7 : Generalized Anxiety Score 09/07/2019 02/21/2017  Nervous, Anxious, on Edge 2 2  Control/stop worrying 2 3  Worry too much - different things 3 3  Trouble relaxing 1 2  Restless 0 0  Easily annoyed or irritable 1 2  Afraid - awful might happen 3 2  Total GAD 7 Score 12 14   Assessment & Plan:  This visit occurred during the SARS-CoV-2 public health emergency.  Safety protocols were in place, including screening questions prior to the visit, additional usage of staff PPE, and extensive cleaning of exam room while observing appropriate contact time as indicated for disinfecting solutions.   Problem List Items Addressed This Visit    Tonsillar enlargement    Incidentally noted R tonsillar enlargement without signs of mass or infection, will watch for now.       Seasonal allergic rhinitis    Reviewed allergen avoidance measures. rec nasal saline,  continue neti pot use, discussed intranasal steroid use with precautions.       Mood disorder ([place]) - Primary    Has forgotten to take 2nd wellbutrin dose. Stable GAD7, slightly improved PHQ9. Will trial once daily wellbutrin 150 mg XL. Update with effect through mychart, RTC 3 mo f/u visit.  Meds ordered this encounter  Medications  . buPROPion (WELLBUTRIN XL) 150 MG 24 hr tablet    Sig: Take 1 tablet (150 mg total) by mouth daily.    Dispense:  30 tablet    Refill:  6    To replace SR   No orders of the defined types were placed in this encounter.   Patient Instructions  Buy some nasal saline irrigation to carry with you.  May use neti pot at home.  Try flonase nasal spray 1-2 squirts in each nostril daily.  Trial wellbutrin XL 150mg  daily in place of SR formulation.  Return in 3 months for follow up. Let me know sooner if any trouble.    Follow up plan: Return in about 3 months (around 12/07/2019) for follow up visit.  Ria Bush, MD

## 2019-09-26 ENCOUNTER — Other Ambulatory Visit: Payer: Self-pay | Admitting: Family Medicine

## 2019-09-27 NOTE — Telephone Encounter (Signed)
Name of Medication: Lorazepam Name of Pharmacy: [facility] Last Fill or Written Date and Quantity: 07/07/19, #30 Last Office Visit and Type: 09/07/19, 3 mo mood f/u Next Office Visit and Type: 12/10/19, 3 mo mood f/u Last Controlled Substance Agreement Date: none Last UDS: none

## 2019-09-27 NOTE — Telephone Encounter (Signed)
ERx 

## 2019-09-28 ENCOUNTER — Other Ambulatory Visit: Payer: Self-pay | Admitting: Family Medicine

## 2019-10-09 ENCOUNTER — Other Ambulatory Visit: Payer: Self-pay | Admitting: Family Medicine

## 2019-10-10 ENCOUNTER — Other Ambulatory Visit: Payer: Self-pay | Admitting: Family Medicine

## 2019-12-10 ENCOUNTER — Ambulatory Visit: Payer: PRIVATE HEALTH INSURANCE | Admitting: Family Medicine

## 2019-12-10 DIAGNOSIS — Z0289 Encounter for other administrative examinations: Secondary | ICD-10-CM

## 2019-12-27 ENCOUNTER — Ambulatory Visit: Payer: PRIVATE HEALTH INSURANCE | Admitting: Family Medicine

## 2020-03-06 ENCOUNTER — Other Ambulatory Visit: Payer: Self-pay | Admitting: Family Medicine

## 2020-03-07 NOTE — Telephone Encounter (Signed)
E-scribed refill.  Plz schedule mood f/u for additional refills ('no show', 12/10/19 and c/x 12/27/19).

## 2020-03-08 NOTE — Telephone Encounter (Signed)
Patient is scheduled   

## 2020-03-20 ENCOUNTER — Ambulatory Visit: Payer: PRIVATE HEALTH INSURANCE | Admitting: Family Medicine

## 2020-03-28 ENCOUNTER — Ambulatory Visit: Payer: PRIVATE HEALTH INSURANCE | Admitting: Family Medicine

## 2020-04-21 ENCOUNTER — Ambulatory Visit: Payer: PRIVATE HEALTH INSURANCE | Admitting: Family Medicine

## 2020-07-20 ENCOUNTER — Telehealth: Payer: Self-pay | Admitting: Family Medicine

## 2020-07-20 MED ORDER — LOSARTAN POTASSIUM-HCTZ 100-25 MG PO TABS: 1.0000 | ORAL_TABLET | Freq: Every day | ORAL | 0 refills | Status: DC

## 2020-07-20 MED ORDER — BUPROPION HCL ER (XL) 150 MG PO TB24: 150.0000 mg | ORAL_TABLET | Freq: Every day | ORAL | 0 refills | Status: DC

## 2020-07-20 NOTE — Addendum Note (Signed)
Addended by: Carter Kitten on: 07/20/2020 10:55 AM   Modules accepted: Orders

## 2020-07-20 NOTE — Telephone Encounter (Signed)
Please schedule CPE with fasting labs prior with Dr. Darnell Level.

## 2020-07-21 NOTE — Telephone Encounter (Signed)
Called patient and got his wife ( per DPR ok to talk to her) she states that he started a new job and does not have his new insurance yet. Will call when he gets it to schedule . EM

## 2020-07-21 NOTE — Telephone Encounter (Signed)
Noted  

## 2020-08-01 ENCOUNTER — Telehealth: Payer: Self-pay

## 2020-08-01 MED ORDER — VALSARTAN-HYDROCHLOROTHIAZIDE 160-25 MG PO TABS: 1.0000 | ORAL_TABLET | Freq: Every day | ORAL | 0 refills | Status: DC

## 2020-08-01 NOTE — Telephone Encounter (Signed)
Spoke with pt's mother, Stanton Kidney (on dpr), relaying Dr. Synthia Innocent message.  She verbalizes understanding and will inform pt.

## 2020-08-01 NOTE — Addendum Note (Signed)
Addended by: Ria Bush on: 08/01/2020 01:47 PM   Modules accepted: Orders

## 2020-08-01 NOTE — Telephone Encounter (Addendum)
Sent in valsartan hctz 160/25 to replace losartan/hctz due to backorder plz notify pt.

## 2020-08-01 NOTE — Telephone Encounter (Signed)
Received faxed message from [facility] stating all strengths of losartan, including plain losartan are on backorder.  Requesting an alternative rx, if appropriate.

## 2020-10-15 ENCOUNTER — Other Ambulatory Visit: Payer: Self-pay | Admitting: Family Medicine

## 2020-10-30 ENCOUNTER — Other Ambulatory Visit: Payer: Self-pay | Admitting: Family Medicine

## 2020-12-01 ENCOUNTER — Other Ambulatory Visit: Payer: Self-pay | Admitting: Family Medicine

## 2020-12-01 NOTE — Telephone Encounter (Signed)
E-scribed refill.  Plz schedule lab and cpe visits.  

## 2020-12-01 NOTE — Telephone Encounter (Signed)
Called pt to make an appt. Pt did not answer so I left a voicemail.

## 2020-12-14 ENCOUNTER — Other Ambulatory Visit: Payer: Self-pay | Admitting: Family Medicine

## 2020-12-15 ENCOUNTER — Other Ambulatory Visit: Payer: Self-pay | Admitting: Family Medicine

## 2020-12-19 ENCOUNTER — Other Ambulatory Visit: Payer: Self-pay | Admitting: Family Medicine

## 2021-01-08 ENCOUNTER — Telehealth: Payer: Self-pay | Admitting: Family Medicine

## 2021-01-08 MED ORDER — BUPROPION HCL ER (XL) 150 MG PO TB24: 150.0000 mg | ORAL_TABLET | Freq: Every day | ORAL | 0 refills | Status: DC

## 2021-01-08 NOTE — Addendum Note (Signed)
Addended by: Brenton Grills on: [id number] PM   Modules accepted: Orders

## 2021-01-08 NOTE — Telephone Encounter (Signed)
  Encourage patient to contact the pharmacy for refills or they can request refills through [place]:  09/07/19  NEXT APPOINTMENT DATE: 01/24/21  MEDICATION: Wellbutrin  Is the patient out of medication? Yes for 2 weeks  [facility]  Let patient know to contact pharmacy at the end of the day to make sure medication is ready.  Please notify patient to allow 48-72 hours to process  CLINICAL FILLS OUT ALL BELOW:   LAST REFILL:  QTY:  REFILL DATE:    OTHER COMMENTS:    Okay for refill?  Please advise

## 2021-01-08 NOTE — Telephone Encounter (Signed)
Has med refill OV on 01/24/21 at 8:00.  E-scribed refill.

## 2021-01-10 ENCOUNTER — Other Ambulatory Visit: Payer: Self-pay | Admitting: Family Medicine

## 2021-01-24 ENCOUNTER — Ambulatory Visit (INDEPENDENT_AMBULATORY_CARE_PROVIDER_SITE_OTHER): Payer: Self-pay | Admitting: Family Medicine

## 2021-01-24 ENCOUNTER — Other Ambulatory Visit: Payer: Self-pay

## 2021-01-24 ENCOUNTER — Encounter: Payer: Self-pay | Admitting: Family Medicine

## 2021-01-24 VITALS — BP 130/78 | HR 112 | Temp 98.7°F | Ht 66.0 in | Wt 279.0 lb

## 2021-01-24 DIAGNOSIS — R7303 Prediabetes: Secondary | ICD-10-CM

## 2021-01-24 DIAGNOSIS — F39 Unspecified mood [affective] disorder: Secondary | ICD-10-CM

## 2021-01-24 DIAGNOSIS — I10 Essential (primary) hypertension: Secondary | ICD-10-CM

## 2021-01-24 MED ORDER — VALSARTAN-HYDROCHLOROTHIAZIDE 160-25 MG PO TABS: 1.0000 | ORAL_TABLET | Freq: Every day | ORAL | 3 refills | Status: DC

## 2021-01-24 MED ORDER — AMLODIPINE BESYLATE 5 MG PO TABS: 5.0000 mg | ORAL_TABLET | Freq: Every day | ORAL | 3 refills | Status: DC

## 2021-01-24 MED ORDER — BUPROPION HCL ER (XL) 150 MG PO TB24: 150.0000 mg | ORAL_TABLET | Freq: Every day | ORAL | 3 refills | Status: DC

## 2021-01-24 NOTE — Assessment & Plan Note (Signed)
Stable period on wellbutrin XL '150mg'$  daily.  This medicine has worked best to date.  Will continue this. Price out meds.

## 2021-01-24 NOTE — Progress Notes (Signed)
Patient ID: Neil Mcintyre, male    DOB: 03-Apr-1976, 45 y.o.   MRN: [id number]  This visit was conducted in person.  BP 130/78 (BP Location: Right Arm, Cuff Size: Large)   Pulse (!) 112   Temp 98.7 F (37.1 C) (Temporal)   Ht '5\' 6"'$  (1.676 m)   Wt 279 lb (126.6 kg)   SpO2 93%   BMI 45.03 kg/m    CC: med refill visit Subjective:   HPI: Neil Mcintyre is a 45 y.o. male presenting on 01/24/2021 for Medication Refill   Last seen 09/2019.  Weight gain noted.  Heart rate elevated - attributes to anxiety.   HTN - Compliant with current antihypertensive regimen of amlodipine '5mg'$ , valsartan/hctz 160/'25mg'$  daily. Does not check blood pressures at home, doesn't have cuff.  No low blood pressure readings or symptoms of dizziness/syncope. Denies HA, vision changes, CP/tightness, SOB, leg swelling. Drinks water good, avoids salt /sodium in diet.   Mood on wellbutrin XL '150mg'$  daily - financial stress, marital stress, family stress, work stress - a lot of it is financial related.   Now working at [employer].      Relevant past medical, surgical, family and social history reviewed and updated as indicated. Interim medical history since our last visit reviewed. Allergies and medications reviewed and updated. Outpatient Medications Prior to Visit  Medication Sig Dispense Refill   LORazepam (ATIVAN) 0.5 MG tablet TAKE 1 TABLET (0.5 MG TOTAL) BY MOUTH AT BEDTIME AS NEEDED FOR ANXIETY OR SLEEP. 30 tablet 0   amLODipine (NORVASC) 5 MG tablet TAKE 1 TABLET (5 MG TOTAL) BY MOUTH DAILY. MUST SCHEDULE OFFICE VISIT FOR MORE REFILLS 30 tablet 0   buPROPion (WELLBUTRIN XL) 150 MG 24 hr tablet Take 1 tablet (150 mg total) by mouth daily. 30 tablet 0   valsartan-hydrochlorothiazide (DIOVAN-HCT) 160-25 MG tablet TAKE 1 TABLET BY MOUTH EVERY DAY 30 tablet 0   losartan-hydrochlorothiazide (HYZAAR) 100-25 MG tablet Take 1 tablet by mouth daily. 90 tablet 0   tamsulosin (FLOMAX) 0.4 MG  CAPS capsule TAKE 1 CAPSULE BY MOUTH EVERY DAY 90 capsule 2   valACYclovir (VALTREX) 500 MG tablet TAKE 1 TABLET BY MOUTH EVERY DAY 30 tablet 1   No facility-administered medications prior to visit.     Per HPI unless specifically indicated in ROS section below Review of Systems  Objective:  BP 130/78 (BP Location: Right Arm, Cuff Size: Large)   Pulse (!) 112   Temp 98.7 F (37.1 C) (Temporal)   Ht '5\' 6"'$  (1.676 m)   Wt 279 lb (126.6 kg)   SpO2 93%   BMI 45.03 kg/m   Wt Readings from Last 3 Encounters:  01/24/21 279 lb (126.6 kg)  09/07/19 252 lb 1 oz (114.3 kg)  07/22/19 254 lb 12.8 oz (115.6 kg)      Physical Exam Vitals and nursing note reviewed.  Constitutional:      Appearance: Normal appearance. He is not ill-appearing.  Cardiovascular:     Rate and Rhythm: Regular rhythm. Tachycardia present.     Pulses: Normal pulses.     Heart sounds: Normal heart sounds. No murmur heard. Pulmonary:     Effort: Pulmonary effort is normal. No respiratory distress.     Breath sounds: Normal breath sounds. No wheezing, rhonchi or rales.  Neurological:     Mental Status: He is alert.  Psychiatric:        Mood and Affect: Mood normal.  Behavior: Behavior normal.      Results for orders placed or performed in visit on 07/19/19  Novel Coronavirus, NAA ([facility])   Specimen: Nasopharyngeal(NP) swabs in vial transport medium   NASOPHARYNGE  TESTING  Result Value Ref Range   SARS-CoV-2, NAA Not Detected Not Detected    Assessment & Plan:  This visit occurred during the SARS-CoV-2 public health emergency.  Safety protocols were in place, including screening questions prior to the visit, additional usage of staff PPE, and extensive cleaning of exam room while observing appropriate contact time as indicated for disinfecting solutions.   Problem List Items Addressed This Visit     Primary hypertension - Primary    Chronic, stable continue current regimen. He finds cost of  medications high - I asked him to price out meds to see if any cheaper alternatives available.  Continue good water intake, avoid salt/sodium in diet, increase potassium rich foods (fruits/vegetables).       Relevant Medications   valsartan-hydrochlorothiazide (DIOVAN-HCT) 160-25 MG tablet   amLODipine (NORVASC) 5 MG tablet   Obesity, morbid, BMI 40.0-49.9 ([place])    Weight gain noted. Encouraged healthy diet and lifestyle choices to affect sustainable weight loss      Prediabetes    Discussed last A1c and concern for deterioration in glycemic control in setting of weight gain. He declines recheck today given financial concerns. No polydipsia, polyuria. To let us know if symptoms of hyperglycemia develop.       Mood disorder ([facility])    Stable period on wellbutrin XL '150mg'$  daily.  This medicine has worked best to date.  Will continue this. Price out meds.         Meds ordered this encounter  Medications   valsartan-hydrochlorothiazide (DIOVAN-HCT) 160-25 MG tablet    Sig: Take 1 tablet by mouth daily.    Dispense:  90 tablet    Refill:  3   amLODipine (NORVASC) 5 MG tablet    Sig: Take 1 tablet (5 mg total) by mouth daily.    Dispense:  90 tablet    Refill:  3   buPROPion (WELLBUTRIN XL) 150 MG 24 hr tablet    Sig: Take 1 tablet (150 mg total) by mouth daily.    Dispense:  90 tablet    Refill:  3   No orders of the defined types were placed in this encounter.   Patient Instructions  Blood pressure looking good - continue current medicines but price them out to see which is the most expensive, let me know what you find.  Continue good water, avoid salt/sodium in the diet to help control blood pressure through healthy diet choices.   Follow up plan: Return in about 1 year (around 01/24/2022) for annual exam, prior fasting for blood work.  Ria Bush, MD

## 2021-01-24 NOTE — Patient Instructions (Signed)
Blood pressure looking good - continue current medicines but price them out to see which is the most expensive, let me know what you find.  Continue good water, avoid salt/sodium in the diet to help control blood pressure through healthy diet choices.

## 2021-01-24 NOTE — Assessment & Plan Note (Signed)
Discussed last A1c and concern for deterioration in glycemic control in setting of weight gain. He declines recheck today given financial concerns. No polydipsia, polyuria. To let us know if symptoms of hyperglycemia develop.

## 2021-01-24 NOTE — Assessment & Plan Note (Signed)
Chronic, stable continue current regimen. He finds cost of medications high - I asked him to price out meds to see if any cheaper alternatives available.  Continue good water intake, avoid salt/sodium in diet, increase potassium rich foods (fruits/vegetables).

## 2021-01-24 NOTE — Assessment & Plan Note (Signed)
Weight gain noted. Encouraged healthy diet and lifestyle choices to affect sustainable weight loss.  

## 2021-05-10 ENCOUNTER — Other Ambulatory Visit: Payer: Self-pay

## 2021-05-10 ENCOUNTER — Encounter: Payer: Self-pay | Admitting: Family Medicine

## 2021-05-10 ENCOUNTER — Telehealth (INDEPENDENT_AMBULATORY_CARE_PROVIDER_SITE_OTHER): Payer: Self-pay | Admitting: Family Medicine

## 2021-05-10 VITALS — Ht 66.0 in

## 2021-05-10 DIAGNOSIS — U071 COVID-19: Secondary | ICD-10-CM | POA: Insufficient documentation

## 2021-05-10 HISTORY — DX: COVID-19: U07.1

## 2021-05-10 MED ORDER — MOLNUPIRAVIR EUA 200MG CAPSULE: 4.0000 | ORAL_CAPSULE | Freq: Two times a day (BID) | ORAL | 0 refills | Status: AC

## 2021-05-10 NOTE — Progress Notes (Signed)
Work note written as instructed by Dr. Diona Browner and sent to patient via [place].

## 2021-05-10 NOTE — Assessment & Plan Note (Signed)
COVID19  infection. No clear sign of bacterial infection at this time. Mild to moderate symptoms on day 2  of illness. No SOB.  No red flags/need for ER visit or in-person exam at respiratory clinic at this time..    Pt high risk for COVID complications given  HTN and obesity  Discussed options to treat COVID including  Antivirals and MAB infusion. Not interested in MAB infusion. .  Absolute contraindications to paxlovid noted... bupropion.  Start molnupiravir x 5 days. If SOB begins symptoms worsening.. have low threshold for in-person exam, if severe shortness of breath ER visit recommended.   Can monitor Oxygen saturation at home with home monitor if able to obtain.  Go to ER if O2 sat < 90% on room air.  Reviewed home care and provided information through [place].  Recommended quarantine until test returns. If returns positive 5 days isolation recommended. Return to work day 6 and wear mask for 4 more days to complete 10 days. Provided info about prevention of spread of COVID 19.

## 2021-05-10 NOTE — Progress Notes (Signed)
VIRTUAL VISIT Due to national recommendations of social distancing due to [place] 19, a virtual visit is felt to be most appropriate for this patient at this time.   I connected with the patient on 05/10/21 at 12:00 PM EST by virtual telehealth platform and verified that I am speaking with the correct person using two identifiers.   I discussed the limitations, risks, security and privacy concerns of performing an evaluation and management service by  virtual telehealth platform and the availability of in person appointments. I also discussed with the patient that there may be a patient responsible charge related to this service. The patient expressed understanding and agreed to proceed.  Patient location: Home Provider Location: [place] Participants: Eliezer Lofts and [name]   Chief Complaint  Patient presents with   Covid Positive    Positive home test last night-Symptoms started yesterday   Cough   Nasal Congestion   Generalized Body Aches   Fatigue    History of Present Illness:  45 year old male patient of Dr. Synthia Innocent with history of HTN, obesity presents  with COVID infection.   Date of Onset:  05/09/2021  He started with head congestion, dry cough, body aches, occ chills.  Yesterday felt hot.. did not measure temperature.  No ear pain, no face pain.   No SOB, no wheeze.  Using Nyquil and Dayquil.   No history of chronic lung disease. Nonsmoker.  COVID 19 screen COVID testing:positive home test 05/09/21 COVID vaccine: 11/11/2019 , 10/05/2019 COVID exposure: No recent travel or known exposure to [employer] ( possible coworker, not sure)  The importance of social distancing was discussed today.    Review of Systems  Constitutional:  Negative for chills and fever.  HENT:  Negative for congestion and ear pain.   Eyes:  Negative for pain and redness.  Respiratory:  Negative for cough and shortness of breath.   Cardiovascular:  Negative for chest pain, palpitations  and leg swelling.  Gastrointestinal:  Negative for abdominal pain, blood in stool, constipation, diarrhea, nausea and vomiting.  Genitourinary:  Negative for dysuria.  Musculoskeletal:  Negative for falls and myalgias.  Skin:  Negative for rash.  Neurological:  Negative for dizziness.  Psychiatric/Behavioral:  Negative for depression. The patient is not nervous/anxious.      Past Medical History:  Diagnosis Date   Anxiety    Diverticulitis of large intestine with perforation with bleeding 2015, 2017   Laparoscopic sigmoid colectomy 2017   Genital herpes    Hypertension    Irritability and anger    due to stress    reports that he quit smoking about 25 years ago. His smoking use included cigarettes. He started smoking about 25 years ago. He has never used smokeless tobacco. He reports current alcohol use of about 1.0 standard drink per week. He reports that he does not use drugs.   Current Outpatient Medications:    amLODipine (NORVASC) 5 MG tablet, Take 1 tablet (5 mg total) by mouth daily., Disp: 90 tablet, Rfl: 3   buPROPion (WELLBUTRIN XL) 150 MG 24 hr tablet, Take 1 tablet (150 mg total) by mouth daily., Disp: 90 tablet, Rfl: 3   LORazepam (ATIVAN) 0.5 MG tablet, TAKE 1 TABLET (0.5 MG TOTAL) BY MOUTH AT BEDTIME AS NEEDED FOR ANXIETY OR SLEEP., Disp: 30 tablet, Rfl: 0   valsartan-hydrochlorothiazide (DIOVAN-HCT) 160-25 MG tablet, Take 1 tablet by mouth daily., Disp: 90 tablet, Rfl: 3   Observations/Objective: Height 5\' 6"  (1.676 m).  Wt Readings from Last 3 Encounters:  01/24/21 279 lb (126.6 kg)  09/07/19 252 lb 1 oz (114.3 kg)  07/22/19 254 lb 12.8 oz (115.6 kg)     Physical Exam  Physical Exam Constitutional:      General: The patient is not in acute distress. Pulmonary:     Effort: Pulmonary effort is normal. No respiratory distress.  Neurological:     Mental Status: The patient is alert and oriented to person, place, and time.  Psychiatric:        Mood and Affect:  Mood normal.        Behavior: Behavior normal.   Assessment and Plan Problem List Items Addressed This Visit     COVID-19 - Primary    COVID19  infection. No clear sign of bacterial infection at this time. Mild to moderate symptoms on day 2  of illness. No SOB.  No red flags/need for ER visit or in-person exam at respiratory clinic at this time..    Pt high risk for COVID complications given  HTN and obesity  Discussed options to treat COVID including  Antivirals and MAB infusion. Not interested in MAB infusion. .  Absolute contraindications to paxlovid noted... bupropion.  Start molnupiravir x 5 days. If SOB begins symptoms worsening.. have low threshold for in-person exam, if severe shortness of breath ER visit recommended.   Can monitor Oxygen saturation at home with home monitor if able to obtain.  Go to ER if O2 sat < 90% on room air.  Reviewed home care and provided information through [employer].  Recommended quarantine until test returns. If returns positive 5 days isolation recommended. Return to work day 6 and wear mask for 4 more days to complete 10 days. Provided info about prevention of spread of COVID 19.      Relevant Medications   molnupiravir EUA (LAGEVRIO) 200 mg CAPS capsule      I discussed the assessment and treatment plan with the patient. The patient was provided an opportunity to ask questions and all were answered. The patient agreed with the plan and demonstrated an understanding of the instructions.   The patient was advised to call back or seek an in-person evaluation if the symptoms worsen or if the condition fails to improve as anticipated.     Eliezer Lofts, MD

## 2021-05-12 ENCOUNTER — Encounter: Payer: Self-pay | Admitting: Family Medicine

## 2021-05-14 ENCOUNTER — Encounter: Payer: Self-pay | Admitting: Family Medicine

## 2021-05-15 NOTE — Telephone Encounter (Signed)
Please send him a work note through [employer] stating he is cleared to return to work as long as he wears a mask and can be on light duty  until 12/16. Thanks

## 2021-05-15 NOTE — Telephone Encounter (Signed)
Work note written as instructed by Dr. Diona Browner and sent to patient via [place].

## 2021-05-21 ENCOUNTER — Encounter: Payer: Self-pay | Admitting: Family Medicine

## 2021-05-21 MED ORDER — PULMICORT FLEXHALER 180 MCG/ACT IN AEPB: 1.0000 | INHALATION_SPRAY | Freq: Two times a day (BID) | RESPIRATORY_TRACT | 0 refills | Status: DC

## 2021-05-22 MED ORDER — PULMICORT FLEXHALER 180 MCG/ACT IN AEPB: 1.0000 | INHALATION_SPRAY | Freq: Two times a day (BID) | RESPIRATORY_TRACT | 0 refills | Status: DC

## 2021-05-22 NOTE — Telephone Encounter (Signed)
Plz phone in Rx as pharmacy says they haven't received it.

## 2021-05-22 NOTE — Addendum Note (Signed)
Addended by: Ria Bush on: 05/22/2021 09:24 AM   Modules accepted: Orders

## 2021-05-22 NOTE — Telephone Encounter (Signed)
Refill left on vm at pharmacy for [place].

## 2021-06-01 ENCOUNTER — Emergency Department: Payer: 59

## 2021-06-01 ENCOUNTER — Other Ambulatory Visit: Payer: Self-pay

## 2021-06-01 ENCOUNTER — Emergency Department
Admission: EM | Admit: 2021-06-01 | Discharge: 2021-06-02 | Disposition: A | Discharge status: Home / Self Care | Payer: 59 | Attending: Emergency Medicine | Admitting: Emergency Medicine

## 2021-06-01 DIAGNOSIS — J209 Acute bronchitis, unspecified: Secondary | ICD-10-CM | POA: Diagnosis not present

## 2021-06-01 DIAGNOSIS — Z20822 Contact with and (suspected) exposure to covid-19: Secondary | ICD-10-CM | POA: Insufficient documentation

## 2021-06-01 DIAGNOSIS — Z8616 Personal history of COVID-19: Secondary | ICD-10-CM | POA: Insufficient documentation

## 2021-06-01 DIAGNOSIS — Z87891 Personal history of nicotine dependence: Secondary | ICD-10-CM | POA: Insufficient documentation

## 2021-06-01 DIAGNOSIS — Z79899 Other long term (current) drug therapy: Secondary | ICD-10-CM | POA: Diagnosis not present

## 2021-06-01 DIAGNOSIS — J069 Acute upper respiratory infection, unspecified: Secondary | ICD-10-CM | POA: Diagnosis not present

## 2021-06-01 DIAGNOSIS — R059 Cough, unspecified: Secondary | ICD-10-CM | POA: Diagnosis present

## 2021-06-01 DIAGNOSIS — I10 Essential (primary) hypertension: Secondary | ICD-10-CM | POA: Diagnosis not present

## 2021-06-01 NOTE — ED Triage Notes (Signed)
Pt has had a chronic cough since having COVID on December 6th. Pt states that he has coughing fits that make it very difficult to breath.

## 2021-06-02 ENCOUNTER — Telehealth: Payer: Self-pay | Admitting: Emergency Medicine

## 2021-06-02 DIAGNOSIS — J069 Acute upper respiratory infection, unspecified: Secondary | ICD-10-CM | POA: Diagnosis not present

## 2021-06-02 LAB — RESP PANEL BY RT-PCR (FLU A&B, COVID) ARPGX2
Influenza A by PCR: NEGATIVE
Influenza B by PCR: NEGATIVE
SARS Coronavirus 2 by RT PCR: NEGATIVE

## 2021-06-02 MED ORDER — HYDROCOD POLST-CPM POLST ER 10-8 MG/5ML PO SUER: 5.0000 mL | Freq: Two times a day (BID) | ORAL | 0 refills | Status: DC

## 2021-06-02 MED ORDER — COMPRESSOR/NEBULIZER MISC: 1.0000 [IU] | 0 refills | Status: DC | PRN

## 2021-06-02 MED ORDER — ALBUTEROL SULFATE (2.5 MG/3ML) 0.083% IN NEBU: 2.5000 mg | INHALATION_SOLUTION | RESPIRATORY_TRACT | 0 refills | Status: DC | PRN

## 2021-06-02 MED ORDER — COMPRESSOR/NEBULIZER MISC
1.0000 [IU] | 0 refills | Status: DC | PRN
Start: 2021-06-02 — End: 2022-06-19

## 2021-06-02 MED ORDER — AZITHROMYCIN 500 MG PO TABS
500.0000 mg | ORAL_TABLET | Freq: Once | ORAL | Status: AC
  Administered 2021-06-02: 500 mg via ORAL
  Filled 2021-06-02: qty 1

## 2021-06-02 MED ORDER — PREDNISONE 10 MG PO TABS: 30.0000 mg | ORAL_TABLET | Freq: Every day | ORAL | 0 refills | Status: AC

## 2021-06-02 MED ORDER — GUAIFENESIN 100 MG/5ML PO LIQD
5.0000 mL | Freq: Once | ORAL | Status: AC
  Administered 2021-06-02: 5 mL via ORAL
  Filled 2021-06-02: qty 5

## 2021-06-02 MED ORDER — PREDNISONE 20 MG PO TABS
30.0000 mg | ORAL_TABLET | Freq: Once | ORAL | Status: AC
  Administered 2021-06-02: 30 mg via ORAL
  Filled 2021-06-02: qty 1

## 2021-06-02 MED ORDER — AZITHROMYCIN 250 MG PO TABS: 250.0000 mg | ORAL_TABLET | Freq: Every day | ORAL | 0 refills | Status: DC

## 2021-06-02 NOTE — Telephone Encounter (Signed)
Patient called our Secretary, Edd Arbour, requesting reorder of nebulizer machine to [facility].  This was reordered and will call pharmacy to make sure it was sent.

## 2021-06-02 NOTE — ED Provider Notes (Signed)
[facility] Emergency Department Provider Note   ____________________________________________   Event Date/Time   First MD Initiated Contact with Patient 06/02/21 [phone]     (approximate)  I have reviewed the triage vital signs and the nursing notes.   HISTORY  Chief Complaint Cough    HPI Neil Mcintyre is a 44 y.o. male who presents to the ED from home with a chief complaint of persistent cough since having COVID on December 6.  Using Pulmicort prescribed by his PCP without relief of symptoms.  Finished course of Paxlovid.  Denies fever, chest pain, shortness of breath, abdominal pain, nausea, vomiting or dizziness.      Past Medical History:  Diagnosis Date   Anxiety    Diverticulitis of large intestine with perforation with bleeding 2015, 2017   Laparoscopic sigmoid colectomy 2017   Genital herpes    Hypertension    Irritability and anger    due to stress    Patient Active Problem List   Diagnosis Date Noted   COVID-19 05/10/2021   Seasonal allergic rhinitis 09/07/2019   Tonsillar enlargement 09/07/2019   Genital herpes    Weak urinary stream 09/04/2017   Mood disorder ([place]) 02/21/2017   Health maintenance examination 10/30/2016   Prediabetes 10/30/2016   Dyslipidemia 10/30/2016   Obesity, morbid, BMI 40.0-49.9 ([place]) 07/29/2016   Diverticulitis of large intestine with perforation with bleeding    Anxiety 02/21/2015   Primary hypertension 02/21/2015    Past Surgical History:  Procedure Laterality Date   COLONOSCOPY W/ POLYPECTOMY  09/2014   diverticulosis ([place])   CYSTOSCOPY W/ RETROGRADES Bilateral 08/16/2015   [name], MD   CYSTOSCOPY WITH STENT PLACEMENT Bilateral 08/16/2015   [name], MD   LAPAROSCOPIC SIGMOID COLECTOMY N/A 08/16/2015   recurrent diverticulitis; [name], MD   [place]   LASIK    Prior to Admission medications   Medication Sig Start Date End Date Taking?  Authorizing Provider  albuterol (PROVENTIL) (2.5 MG/3ML) 0.083% nebulizer solution Take 3 mLs (2.5 mg total) by nebulization every 4 (four) hours as needed for wheezing or shortness of breath. 06/02/21  Yes Paulette Blanch, MD  azithromycin (ZITHROMAX) 250 MG tablet Take 1 tablet (250 mg total) by mouth daily. 06/02/21  Yes Paulette Blanch, MD  chlorpheniramine-HYDROcodone [facility] PENNKINETIC ER) 10-8 MG/5ML SUER Take 5 mLs by mouth 2 (two) times daily. 06/02/21  Yes Paulette Blanch, MD  Nebulizers (COMPRESSOR/NEBULIZER) MISC 1 Units by Does not apply route every 4 (four) hours as needed. 06/02/21  Yes Paulette Blanch, MD  predniSONE (DELTASONE) 10 MG tablet Take 3 tablets (30 mg total) by mouth daily for 4 days. 3 tablets daily x 4 days 06/02/21 06/06/21 Yes Paulette Blanch, MD  amLODipine (NORVASC) 5 MG tablet Take 1 tablet (5 mg total) by mouth daily. 01/24/21   Ria Bush, MD  budesonide (PULMICORT FLEXHALER) 180 MCG/ACT inhaler Inhale 1 puff into the lungs in the morning and at bedtime. 05/22/21   Ria Bush, MD  buPROPion (WELLBUTRIN XL) 150 MG 24 hr tablet Take 1 tablet (150 mg total) by mouth daily. 01/24/21   Ria Bush, MD  LORazepam (ATIVAN) 0.5 MG tablet TAKE 1 TABLET (0.5 MG TOTAL) BY MOUTH AT BEDTIME AS NEEDED FOR ANXIETY OR SLEEP. 09/27/19   Ria Bush, MD  valsartan-hydrochlorothiazide (DIOVAN-HCT) 160-25 MG tablet Take 1 tablet by mouth daily. 01/24/21   Ria Bush, MD    Allergies Patient has no  known allergies.  Family History  Adopted: Yes  Family history unknown: Yes    Social History Social History   Tobacco Use   Smoking status: Former    Types: Cigarettes    Start date: 06/1995    Quit date: 05/1996    Years since quitting: 25.0   Smokeless tobacco: Never  Substance Use Topics   Alcohol use: Yes    Alcohol/week: 1.0 standard drink    Types: 1 Cans of beer per week    Comment: socially   Drug use: No    Comment: some in HS    Review of  Systems  Constitutional: No fever/chills Eyes: No visual changes. ENT: No sore throat. Cardiovascular: Denies chest pain. Respiratory: Positive for cough and wheezing.  Denies shortness of breath. Gastrointestinal: No abdominal pain.  No nausea, no vomiting.  No diarrhea.  No constipation. Genitourinary: Negative for dysuria. Musculoskeletal: Negative for back pain. Skin: Negative for rash. Neurological: Negative for headaches, focal weakness or numbness.   ____________________________________________   PHYSICAL EXAM:  VITAL SIGNS: ED Triage Vitals  Enc Vitals Group     BP 06/01/21 2053 (!) 142/109     Pulse Rate 06/01/21 2053 (!) 115     Resp 06/01/21 2053 (!) 22     Temp 06/01/21 2053 97.9 F (36.6 C)     Temp Source 06/01/21 2053 Oral     SpO2 06/01/21 2053 95 %     Weight 06/01/21 2051 269 lb (122 kg)     Height 06/01/21 2051 5\' 6"  (1.676 m)     Head Circumference --      Peak Flow --      Pain Score 06/01/21 2058 0     Pain Loc --      Pain Edu? --      Excl. in [place]? --     Constitutional: Alert and oriented. Well appearing and in no acute distress. Eyes: Conjunctivae are normal. PERRL. EOMI. Head: Atraumatic. Nose: No congestion/rhinnorhea. Mouth/Throat: Mucous membranes are moist.   Neck: No stridor.   Cardiovascular: Normal rate, regular rhythm. Grossly normal heart sounds.  Good peripheral circulation. Respiratory: Normal respiratory effort.  No retractions. Lungs diminished bibasilarly but otherwise CTAB.  Dry cough noted. Gastrointestinal: Soft and nontender. No distention. No abdominal bruits. No CVA tenderness. Musculoskeletal: No lower extremity tenderness nor edema.  No joint effusions. Neurologic:  Normal speech and language. No gross focal neurologic deficits are appreciated. No gait instability. Skin:  Skin is warm, dry and intact. No rash noted. Psychiatric: Mood and affect are normal. Speech and behavior are  normal.  ____________________________________________   LABS (all labs ordered are listed, but only abnormal results are displayed)  Labs Reviewed  RESP PANEL BY RT-PCR (FLU A&B, COVID) [id number]   ____________________________________________  EKG  ED ECG REPORT I, Tsutomu Barfoot J, the attending physician, personally viewed and interpreted this ECG.   Date: 06/02/2021  EKG Time: 2101  Rate: 115  Rhythm: sinus tachycardia  Axis: Normal  Intervals:none  ST&T Change: Nonspecific  ____________________________________________  RADIOLOGY I, Lujuana Kapler J, personally viewed and evaluated these images (plain radiographs) as part of my medical decision making, as well as reviewing the written report by the radiologist.  ED MD interpretation: Bibasilar airspace disease likely atelectasis  Official radiology report(s): DG Chest 2 View  Result Date: 06/01/2021 CLINICAL DATA:  Chronic cough following COVID infection. EXAM: CHEST - 2 VIEW COMPARISON:  Two-view chest x-ray 08/08/2015 FINDINGS: Heart size normal. Lung volumes are low. Minimal  ill-defined airspace opacities are present at the lung bases. Upper lung fields are clear. Axial skeleton is unremarkable. IMPRESSION: 1. Low lung volumes. 2. Minimal ill-defined bibasilar airspace disease likely reflects atelectasis. No significant airspace consolidation. Electronically Signed   By: San Morelle M.D.   On: 06/01/2021 21:23    ____________________________________________   PROCEDURES  Procedure(s) performed (including Critical Care):  Procedures   ____________________________________________   INITIAL IMPRESSION / ASSESSMENT AND PLAN / ED COURSE  As part of my medical decision making, I reviewed the following data within the [place] notes reviewed and incorporated, Labs reviewed, Old chart reviewed, Radiograph reviewed, and Notes from prior ED visits     45 year old male presenting with  persistent cough after testing positive for COVID-19 on 05/08/2021. Differential includes, but is not limited to, viral syndrome, bronchitis including COPD exacerbation, pneumonia, reactive airway disease including asthma, CHF including exacerbation with or without pulmonary/interstitial edema, pneumothorax, ACS, thoracic trauma, and pulmonary embolism.   Respiratory panel is negative.  Chest x-ray demonstrates atelectasis.  Will treat with Z-Pak, lower dose prednisone as patient experiences mood swings on full dose, Tussionex and prescription for albuterol nebulizer.  Strict return precautions given.  Patient verbalizes understanding and agrees with plan of care.      ____________________________________________   FINAL CLINICAL IMPRESSION(S) / ED DIAGNOSES  Final diagnoses:  Acute bronchitis, unspecified organism  Viral URI with cough     ED Discharge Orders          Ordered    albuterol (PROVENTIL) (2.5 MG/3ML) 0.083% nebulizer solution  Every 4 hours PRN        06/02/21 0536    Nebulizers (COMPRESSOR/NEBULIZER) MISC  Every 4 hours PRN        06/02/21 0536    chlorpheniramine-HYDROcodone (TUSSIONEX PENNKINETIC ER) 10-8 MG/5ML SUER  2 times daily        06/02/21 0536    azithromycin (ZITHROMAX) 250 MG tablet  Daily        06/02/21 0536    predniSONE (DELTASONE) 10 MG tablet  Daily        06/02/21 0536             Note:  This document was prepared using Dragon voice recognition software and may include unintentional dictation errors.    Paulette Blanch, MD 06/02/21 [phone]

## 2021-06-02 NOTE — Discharge Instructions (Signed)
1.  Finish antibiotic as prescribed.  Take your next dose Sunday morning. 2.  Finish steroid as prescribed.  Take your next dose Sunday morning. 3.  You may take Tussionex as needed for cough. 4.  You may use albuterol nebulizer every 4 hours as needed for cough/wheezing/difficulty breathing. 5.  Return to the ER for worsening symptoms, persistent vomiting, difficulty breathing or other concerns.

## 2021-06-05 ENCOUNTER — Telehealth: Payer: Self-pay

## 2021-06-05 NOTE — Telephone Encounter (Signed)
[place] Night - Client TELEPHONE ADVICE RECORD [facility] Patient Name: Neil Mcintyre SES Gender: Male DOB: 10-Jul-1975 Age: 46 Y 64 M 5 D Return Phone Number: [phone] (Primary) Address: City/ State/ Zip: [place]  [address] Client [facility] Night - Client Client Site [place] Provider Ria Bush - MD Contact Type Call Who Is Calling Patient / Member / Family / Caregiver Call Type Triage / Clinical Relationship To Patient Self Return Phone Number [phone] (Primary) Chief Complaint BREATHING - shortness of breath or sounds breathless Reason for Call Symptomatic / Request for [place] states that he called in earlier tonight and was instructed to go to [facility] or ER. The walk in was closed and the ER has a 12 hour wait. Was told he needs to be seen within 4 hours, can anything be done, has had an ekg, blood test, xrays. He has a cough, difficulty breathing [place] Not Listed stay and be seen Translation No Nurse Assessment Nurse: Drue Flirt, RN, Tia Date/Time (Eastern Time): 06/01/2021 10:09:23 PM Confirm and document reason for call. If symptomatic, describe symptoms. ---Caller states that he called in earlier tonight and was instructed to go to [facility] or ER. The walk in was closed and the ER has a 12 hour wait. Was told he needs to be seen within 4 hours, can anything be done, has had an ekg, blood test, xrays. He has a cough, difficulty breathing Does the patient have any new or worsening symptoms? ---No Please document clinical information provided and list any resource used. ---I recommended since he has already had testing and things done to stay and be seen there. He was asking if the testing he has already had done could be looking at by Dr. Darnell Level and I advised him that that could not be done at this time. Disp. Time Eilene Ghazi Time)  Disposition Final User 06/01/2021 10:04:27 PM Send to Urgent Zorita Pang 06/01/2021 10:11:27 PM Clinical Call Yes Drue Flirt, RN, [place] Disagree/Comply Comply Caller Understands Yes PreDisposition Did not know what to do PLEASE NOTE: All timestamps contained within this report are represented as [place] Standard Time. CONFIDENTIALTY NOTICE: This fax transmission is intended only for the addressee. It contains information that is legally privileged, confidential or otherwise protected from use or disclosure. If you are not the intended recipient, you are strictly prohibited from reviewing, disclosing, copying using or disseminating any of this information or taking any action in reliance on or regarding this information. If you have received this fax in error, please notify us immediately by telephone so that we can arrange for its return to [place]. Phone: [phone], Toll-Free: [phone], Fax: [phone] Page: 2 of 2 Call Id: [id number] Referrals GO TO [place]

## 2021-06-05 NOTE — Telephone Encounter (Signed)
Plz call for update on symptoms, ensure he got nebulizer medicine with machine filled over weekend.

## 2021-06-05 NOTE — Telephone Encounter (Signed)
[place] Night - Client TELEPHONE ADVICE RECORD [facility] Patient Name: Neil Mcintyre [place] SES Gender: Male DOB: [birth year] Age: 46 Y 58 M 5 D Return Phone Number: [phone] (Primary) Address: City/ State/ Zip: [place] [address] Client [facility] Night - Client Client Site [name] Provider Ria Bush - MD Contact Type Call Who Is Calling Patient / Member / Family / Caregiver Call Type Triage / Clinical Relationship To Patient Self Return Phone Number [phone] (Primary) Chief Complaint BREATHING - shortness of breath or sounds breathless Reason for Call Symptomatic / Request for [place] states that he went to an ER last night and they put him on a nebulizer treatment. [facility] and he was told he had to pick this up from his doctor's office. He is still not getting enough oxygen in his lungs and having breathing difficulty. Translation No Nurse Assessment Nurse: Martyn Ehrich, RN, [place] Date/Time (Eastern Time): 06/02/2021 3:19:05 PM Confirm and document reason for call. If symptomatic, describe symptoms. ---PT had breathing troubles for awhile. He was told to go to [facility] or ER yesterday by the nurse. He went to ER last night and they told him he is not getting enough oxygen and he needed steroids and zpack and albuterol sulfate for the nebulizer and he doesnt have the neb machine and [facility] and he would have to go thru [facility] to get it. He is not worse than when he left ER. He was discharged 545am. Told him he should locate a pharmacy that has a nebulizer machine - then call the ER where he was and tell them that he needs an order for the neb machine to go to the pharmacy that has one. Does the patient have any new or worsening symptoms? ---No Disp. Time Eilene Ghazi Time) Disposition Final  User 06/02/2021 3:16:26 PM Send to Urgent Queue Wynema Birch 06/02/2021 3:24:54 PM Clinical Call Yes Martyn Ehrich, RN, Bobette Mo

## 2021-06-05 NOTE — Telephone Encounter (Signed)
Tried calling the patient and he did not answer. LVM for patient to call back.   

## 2021-06-05 NOTE — Telephone Encounter (Addendum)
Per chart review tab pt was seen at [facility] ED on 06/01/21. See 06/02/21 phone note about nebulizer and neb solution. Sending note to Dr Darnell Level and Lattie Haw CMA and will teams [place] since Lattie Haw is out of office.

## 2021-06-05 NOTE — Telephone Encounter (Signed)
[facility] Night - Client TELEPHONE ADVICE RECORD [facility] Patient Name: Neil Mcintyre [place] Gender: Male DOB: 02/04/1976 Age: 46 Y 51 M 5 D Return Phone Number: [phone] (Primary) Address: City/ State/ Zip: [place] [address] Client [name] - Client Client Site [place] Provider Ria Bush - MD Contact Type Call Who Is Calling Patient / Member / Family / Caregiver Call Type Triage / Clinical Relationship To Patient Self Return Phone Number [phone] (Primary) Chief Complaint Cough Reason for Call Symptomatic / Request for [place] states he has been coughing for 3 weeks after catching COVID. Translation No Nurse Assessment Nurse: Volanda Napoleon, RN, Wells Guiles Date/Time Eilene Ghazi Time): 06/01/2021 7:49:52 PM Confirm and document reason for call. If symptomatic, describe symptoms. ---Caller states that he has SOB, worsening cough. DX with covid on dec 6th. Does the patient have any new or worsening symptoms? ---Yes Will a triage be completed? ---Yes Related visit to physician within the last 2 weeks? ---Yes Does the PT have any chronic conditions? (i.e. diabetes, asthma, this includes High risk factors for pregnancy, etc.) ---No Is this a behavioral health or substance abuse call? ---No Guidelines Guideline Title Affirmed Question Affirmed Notes Nurse Date/Time (Eastern Time) COVID-19 - Persisting Symptoms Follow-up Call [1] MILD difficulty breathing (e.g., minimal/no SOB at rest, SOB with walking, pulse <100) AND [2] new-onset Jori Moll 06/01/2021 7:50:50 PM Disp. Time Eilene Ghazi Time) Disposition Final User 06/01/2021 7:53:56 PM See HCP within 4 Hours (or PCP triage) Yes Volanda Napoleon, RN, Wells Guiles PLEASE NOTE: All timestamps contained within this report are represented as [place] Standard Time. CONFIDENTIALTY NOTICE: This fax  transmission is intended only for the addressee. It contains information that is legally privileged, confidential or otherwise protected from use or disclosure. If you are not the intended recipient, you are strictly prohibited from reviewing, disclosing, copying using or disseminating any of this information or taking any action in reliance on or regarding this information. If you have received this fax in error, please notify us immediately by telephone so that we can arrange for its return to [place]. Phone: [phone], Toll-Free: [phone], Fax: [phone] Page: 2 of 2 Call Id: [id number] [place] Disagree/Comply Comply Caller Understands Yes PreDisposition Home Care Care Advice Given Per Guideline SEE HCP (OR PCP TRIAGE) WITHIN 4 HOURS: * ED: Patients who may need surgery or hospital admission need to be sent to an ED. So do most patients with serious symptoms or complex medical problems. CALL BACK IF: * You become worse Referrals [place]

## 2021-06-08 NOTE — Telephone Encounter (Signed)
Left message on answering machine for patient to call the office back.

## 2021-08-27 ENCOUNTER — Encounter: Payer: Self-pay | Admitting: Family Medicine

## 2021-08-27 ENCOUNTER — Other Ambulatory Visit: Payer: Self-pay | Admitting: Family Medicine

## 2021-08-27 NOTE — Telephone Encounter (Signed)
Refill request Valtrex ?Last office visit video acute 05/10/21 ?No upcoming appointment scheduled ?Last refill 11/05/19 #30/1 ?Last CPE 06/08/19 ?

## 2021-09-18 ENCOUNTER — Other Ambulatory Visit: Payer: Self-pay | Admitting: Family Medicine

## 2021-09-18 NOTE — Telephone Encounter (Signed)
Refill request Valtrex ?Last refill 08/27/21 #30 ?Last office visit acute 05/10/21 ?No upcoming appointment scheduled ?

## 2021-09-18 NOTE — Telephone Encounter (Signed)
ERx 

## 2021-10-06 ENCOUNTER — Other Ambulatory Visit: Payer: Self-pay | Admitting: Family Medicine

## 2021-10-09 ENCOUNTER — Telehealth: Payer: Self-pay | Admitting: Family Medicine

## 2021-10-09 NOTE — Telephone Encounter (Signed)
(  See 10/09/21 phn note) ? ?Spoke with pt asking if he has restarted tamsulosin.  States he wants to since he now has insurance.  Offered to schedule CPE.  Pt states he has a lot of appts right now but will call back to schedule soon.   ? ?E-scribed 90-day refill.  ?

## 2021-10-09 NOTE — Telephone Encounter (Signed)
Ok to send 90-day rx? ?

## 2021-10-09 NOTE — Telephone Encounter (Signed)
Kizzie Fantasia (Spouse) called and wanted the nurse to give Neil Mcintyre a call. He wanted to ask some questions about his medication. ? ?Callback Number: [phone] ?

## 2021-10-09 NOTE — Telephone Encounter (Signed)
Spoke with pt's wife, Abigail Butts (on dpry) she asks that I call pt at his cell # [phone] to discuss the refill request.  [See 10/06/21, Refill req] ?

## 2021-10-09 NOTE — Telephone Encounter (Signed)
Tamsulosin not on current list.  Per 01/04/21 OV, med was stopped.  ? ?Lvm asking pt to call back.  Need to know if pt is taking tamsulosin 0.4 mg.  ?

## 2021-10-11 NOTE — Telephone Encounter (Signed)
ERx 

## 2021-10-20 ENCOUNTER — Encounter: Payer: Self-pay | Admitting: Family Medicine

## 2021-12-09 ENCOUNTER — Ambulatory Visit
Admission: EM | Admit: 2021-12-09 | Discharge: 2021-12-09 | Disposition: A | Discharge status: Home / Self Care | Payer: 59 | Attending: Family Medicine | Admitting: Family Medicine

## 2021-12-09 ENCOUNTER — Encounter: Payer: Self-pay | Admitting: Emergency Medicine

## 2021-12-09 DIAGNOSIS — J019 Acute sinusitis, unspecified: Secondary | ICD-10-CM | POA: Diagnosis not present

## 2021-12-09 DIAGNOSIS — J209 Acute bronchitis, unspecified: Secondary | ICD-10-CM

## 2021-12-09 MED ORDER — ALBUTEROL SULFATE HFA 108 (90 BASE) MCG/ACT IN AERS: 2.0000 | INHALATION_SPRAY | Freq: Four times a day (QID) | RESPIRATORY_TRACT | 0 refills | Status: DC | PRN

## 2021-12-09 MED ORDER — PREDNISONE 20 MG PO TABS: 20.0000 mg | ORAL_TABLET | Freq: Every day | ORAL | 0 refills | Status: AC

## 2021-12-09 MED ORDER — DOXYCYCLINE HYCLATE 100 MG PO CAPS: 100.0000 mg | ORAL_CAPSULE | Freq: Two times a day (BID) | ORAL | 0 refills | Status: DC

## 2021-12-09 MED ORDER — ALBUTEROL SULFATE HFA 108 (90 BASE) MCG/ACT IN AERS
2.0000 | INHALATION_SPRAY | Freq: Once | RESPIRATORY_TRACT | Status: AC
  Administered 2021-12-09: 2 via RESPIRATORY_TRACT

## 2021-12-09 MED ORDER — PROMETHAZINE-DM 6.25-15 MG/5ML PO SYRP: 5.0000 mL | ORAL_SOLUTION | Freq: Three times a day (TID) | ORAL | 0 refills | Status: DC | PRN

## 2021-12-09 NOTE — ED Triage Notes (Signed)
Pt c/o ST, cough, nasal congestion x 1 week.

## 2021-12-09 NOTE — Discharge Instructions (Signed)
If any of your symptoms worsen or do not readily improve with prescribed treatment return for evaluation.  Use your albuterol inhaler 2 puffs every 4-6 hours as needed for any shortness of breath, wheezing or chest tightness.

## 2021-12-09 NOTE — ED Provider Notes (Signed)
[facility]    CSN: [id number] Arrival date & time: 12/09/21  1007      History   Chief Complaint Chief Complaint  Patient presents with  . Sore Throat  . Cough  . Nasal Congestion    HPI EHSAN CORVIN is a 46 y.o. male.   HPI Patient presents today for evaluation of persistent cough, nasal congestion, sore throat for over a week.  He denies any fever although endorses some increased work of breathing.  Patient had COVID and since that time has developed wheezing or shortness of breath anytime he develops a cold. Past Medical History:  Diagnosis Date  . Anxiety   . Diverticulitis of large intestine with perforation with bleeding 2015, 2017   Laparoscopic sigmoid colectomy 2017  . Genital herpes   . Hypertension   . Irritability and anger    due to stress    Patient Active Problem List   Diagnosis Date Noted  . COVID-19 05/10/2021  . Seasonal allergic rhinitis 09/07/2019  . Tonsillar enlargement 09/07/2019  . Genital herpes   . Weak urinary stream 09/04/2017  . Mood disorder ([place]) 02/21/2017  . Health maintenance examination 10/30/2016  . Prediabetes 10/30/2016  . Dyslipidemia 10/30/2016  . Obesity, morbid, BMI 40.0-49.9 ([place]) 07/29/2016  . Diverticulitis of large intestine with perforation with bleeding   . Anxiety 02/21/2015  . Primary hypertension 02/21/2015    Past Surgical History:  Procedure Laterality Date  . COLONOSCOPY W/ POLYPECTOMY  09/2014   diverticulosis ([place])  . CYSTOSCOPY W/ RETROGRADES Bilateral 08/16/2015   [name], MD  . CYSTOSCOPY WITH STENT PLACEMENT Bilateral 08/16/2015   [name], MD  . LAPAROSCOPIC SIGMOID COLECTOMY N/A 08/16/2015   recurrent diverticulitis; [name], MD  . [place]   LASIK       Home Medications    Prior to Admission medications   Medication Sig Start Date End Date Taking? Authorizing Provider  buPROPion (WELLBUTRIN XL) 150 MG 24 hr tablet Take 1 tablet  (150 mg total) by mouth daily. 01/24/21  Yes Ria Bush, MD  LORazepam (ATIVAN) 0.5 MG tablet TAKE 1 TABLET (0.5 MG TOTAL) BY MOUTH AT BEDTIME AS NEEDED FOR ANXIETY OR SLEEP. 09/27/19  Yes Ria Bush, MD  valACYclovir (VALTREX) 500 MG tablet TAKE 1 TABLET BY MOUTH EVERY DAY 10/11/21  Yes Ria Bush, MD  valsartan-hydrochlorothiazide (DIOVAN-HCT) 160-25 MG tablet Take 1 tablet by mouth daily. 01/24/21  Yes Ria Bush, MD  albuterol (PROVENTIL) (2.5 MG/3ML) 0.083% nebulizer solution Take 3 mLs (2.5 mg total) by nebulization every 4 (four) hours as needed for wheezing or shortness of breath. 06/02/21   Paulette Blanch, MD  amLODipine (NORVASC) 5 MG tablet Take 1 tablet (5 mg total) by mouth daily. 01/24/21   Ria Bush, MD  azithromycin (ZITHROMAX) 250 MG tablet Take 1 tablet (250 mg total) by mouth daily. 06/02/21   Paulette Blanch, MD  budesonide (PULMICORT FLEXHALER) 180 MCG/ACT inhaler Inhale 1 puff into the lungs in the morning and at bedtime. 05/22/21   Ria Bush, MD  chlorpheniramine-HYDROcodone [facility]) 10-8 MG/5ML SUER Take 5 mLs by mouth 2 (two) times daily. 06/02/21   Paulette Blanch, MD  Nebulizers (COMPRESSOR/NEBULIZER) MISC 1 Units by Does not apply route every 4 (four) hours as needed. 06/02/21   Naaman Plummer, MD  tamsulosin (FLOMAX) 0.4 MG CAPS capsule TAKE 1 CAPSULE BY MOUTH EVERY DAY 10/09/21   Ria Bush, MD  Family History Family History  Adopted: Yes  Family history unknown: Yes    Social History Social History   Tobacco Use  . Smoking status: Former    Types: Cigarettes    Start date: 06/1995    Quit date: 05/1996    Years since quitting: 25.6  . Smokeless tobacco: Never  Vaping Use  . Vaping Use: Never used  Substance Use Topics  . Alcohol use: Yes    Alcohol/week: 1.0 standard drink of alcohol    Types: 1 Cans of beer per week    Comment: socially  . Drug use: No    Comment: some in HS      Allergies   Patient has no known allergies.   Review of Systems Review of Systems   Physical Exam Triage Vital Signs ED Triage Vitals  Enc Vitals Group     BP 12/09/21 1101 115/70     Pulse Rate 12/09/21 1101 (!) 103     Resp 12/09/21 1101 18     Temp 12/09/21 1101 99.2 F (37.3 C)     Temp Source 12/09/21 1101 Oral     SpO2 12/09/21 1101 95 %     Weight --      Height --      Head Circumference --      Peak Flow --      Pain Score 12/09/21 1058 0     Pain Loc --      Pain Edu? --      Excl. in [place]? --    No data found.  Updated Vital Signs BP 115/70 (BP Location: Left Arm)   Pulse (!) 103   Temp 99.2 F (37.3 C) (Oral)   Resp 18   SpO2 95%   Visual Acuity Right Eye Distance:   Left Eye Distance:   Bilateral Distance:    Right Eye Near:   Left Eye Near:    Bilateral Near:     Physical Exam   UC Treatments / Results  Labs (all labs ordered are listed, but only abnormal results are displayed) Labs Reviewed - No data to display  EKG   Radiology No results found.  Procedures Procedures (including critical care time)  Medications Ordered in UC Medications - No data to display  Initial Impression / Assessment and Plan / UC Course  I have reviewed the triage vital signs and the nursing notes.  Pertinent labs & imaging results that were available during my care of the patient were reviewed by me and considered in my medical decision making (see chart for details).     *** Final Clinical Impressions(s) / UC Diagnoses   Final diagnoses:  None   Discharge Instructions   None    ED Prescriptions   None    PDMP not reviewed this encounter.

## 2022-01-05 ENCOUNTER — Other Ambulatory Visit: Payer: Self-pay | Admitting: Family Medicine

## 2022-01-28 ENCOUNTER — Ambulatory Visit
Admission: EM | Admit: 2022-01-28 | Discharge: 2022-01-28 | Disposition: A | Discharge status: Home / Self Care | Payer: 59 | Attending: Family Medicine | Admitting: Family Medicine

## 2022-01-28 DIAGNOSIS — R509 Fever, unspecified: Secondary | ICD-10-CM | POA: Insufficient documentation

## 2022-01-28 DIAGNOSIS — Z20822 Contact with and (suspected) exposure to covid-19: Secondary | ICD-10-CM | POA: Diagnosis present

## 2022-01-28 DIAGNOSIS — J22 Unspecified acute lower respiratory infection: Secondary | ICD-10-CM | POA: Insufficient documentation

## 2022-01-28 DIAGNOSIS — B9789 Other viral agents as the cause of diseases classified elsewhere: Secondary | ICD-10-CM | POA: Diagnosis not present

## 2022-01-28 LAB — RESP PANEL BY RT-PCR (FLU A&B, COVID) ARPGX2
Influenza A by PCR: NEGATIVE
Influenza B by PCR: NEGATIVE
SARS Coronavirus 2 by RT PCR: NEGATIVE

## 2022-01-28 MED ORDER — ALBUTEROL SULFATE (2.5 MG/3ML) 0.083% IN NEBU: 2.5000 mg | INHALATION_SOLUTION | Freq: Four times a day (QID) | RESPIRATORY_TRACT | 0 refills | Status: DC | PRN

## 2022-01-28 MED ORDER — ALBUTEROL SULFATE HFA 108 (90 BASE) MCG/ACT IN AERS: 2.0000 | INHALATION_SPRAY | Freq: Four times a day (QID) | RESPIRATORY_TRACT | 0 refills | Status: DC | PRN

## 2022-01-28 MED ORDER — PROMETHAZINE-DM 6.25-15 MG/5ML PO SYRP: 5.0000 mL | ORAL_SOLUTION | Freq: Three times a day (TID) | ORAL | 0 refills | Status: DC | PRN

## 2022-01-28 MED ORDER — ALBUTEROL SULFATE HFA 108 (90 BASE) MCG/ACT IN AERS
2.0000 | INHALATION_SPRAY | Freq: Once | RESPIRATORY_TRACT | Status: AC
  Administered 2022-01-28: 2 via RESPIRATORY_TRACT

## 2022-01-28 MED ORDER — PREDNISONE 20 MG PO TABS: 40.0000 mg | ORAL_TABLET | Freq: Every day | ORAL | 0 refills | Status: DC

## 2022-01-28 NOTE — Discharge Instructions (Addendum)
Your COVID 19 / Flu results will be available in less than 24 hours. Negative results are immediately resulted to Mychart. Positive results will receive a follow-up call from our clinic.  If you develop any difficulty breathing or severe chest pain, go to the nearest emergency department.  Use / take medication as prescribed.

## 2022-01-28 NOTE — ED Triage Notes (Signed)
Patient presents to Urgent Care with complaints of cough and congestion x 4 days. States fever today. Taking dayquil and nyquil.

## 2022-01-28 NOTE — ED Provider Notes (Signed)
[facility] Pal[facility]    CSN: [id number] Arrival date & time: 01/28/22  0093      History   Chief Complaint Chief Complaint  Patient presents with   Cough   Nasal Congestion    HPI Neil Mcintyre is a 46 y.o. male.   HPI Patient presents today for evaluation of cough, fever, fatigue, congestin, body aches, x 4 days. Patient denies any known sick contacts. He had been taking Dayquil and Nyquil. He denies shortness of breath, although endorses cough has been dry until today. Patient has a history of reactive airway induced by previous COVID infection.  He reports he has been out of his neb treatment along with his inhaler and has been unable to use since this current illness onset.  Current SPO2 is 94%.  Patient is unaware of how high his temperature got as he was unable to measure it however he has felt febrile . Past Medical History:  Diagnosis Date   Anxiety    Diverticulitis of large intestine with perforation with bleeding 2015, 2017   Laparoscopic sigmoid colectomy 2017   Genital herpes    Hypertension    Irritability and anger    due to stress    Patient Active Problem List   Diagnosis Date Noted   COVID-19 05/10/2021   Seasonal allergic rhinitis 09/07/2019   Tonsillar enlargement 09/07/2019   Genital herpes    Weak urinary stream 09/04/2017   Mood disorder ([place]) 02/21/2017   Health maintenance examination 10/30/2016   Prediabetes 10/30/2016   Dyslipidemia 10/30/2016   Obesity, morbid, BMI 40.0-49.9 ([facility]) 07/29/2016   Diverticulitis of large intestine with perforation with bleeding    Anxiety 02/21/2015   Primary hypertension 02/21/2015    Past Surgical History:  Procedure Laterality Date   COLONOSCOPY W/ POLYPECTOMY  09/2014   diverticulosis ([place])   CYSTOSCOPY W/ RETROGRADES Bilateral 08/16/2015   [name], MD   CYSTOSCOPY WITH STENT PLACEMENT Bilateral 08/16/2015   [name], MD   LAPAROSCOPIC SIGMOID COLECTOMY N/A 08/16/2015    recurrent diverticulitis; [name], MD   [place] Medications    Prior to Admission medications   Medication Sig Start Date End Date Taking? Authorizing Provider  albuterol (PROVENTIL) (2.5 MG/3ML) 0.083% nebulizer solution Take 3 mLs (2.5 mg total) by nebulization every 6 (six) hours as needed for wheezing or shortness of breath. 01/28/22  Yes Scot Jun, FNP  predniSONE (DELTASONE) 20 MG tablet Take 2 tablets (40 mg total) by mouth daily with breakfast. 01/28/22  Yes Scot Jun, FNP  albuterol (VENTOLIN HFA) 108 (90 Base) MCG/ACT inhaler Inhale 2 puffs into the lungs every 6 (six) hours as needed for wheezing or shortness of breath. 01/28/22   Scot Jun, FNP  amLODipine (NORVASC) 5 MG tablet Take 1 tablet (5 mg total) by mouth daily. 01/24/21   Ria Bush, MD  azithromycin (ZITHROMAX) 250 MG tablet Take 1 tablet (250 mg total) by mouth daily. 06/02/21   Paulette Blanch, MD  budesonide (PULMICORT FLEXHALER) 180 MCG/ACT inhaler Inhale 1 puff into the lungs in the morning and at bedtime. 05/22/21   Ria Bush, MD  buPROPion (WELLBUTRIN XL) 150 MG 24 hr tablet Take 1 tablet (150 mg total) by mouth daily. 01/24/21   Ria Bush, MD  chlorpheniramine-HYDROcodone [facility]) 10-8 MG/5ML SUER Take 5 mLs by mouth 2 (two) times daily. 06/02/21   Paulette Blanch,  MD  doxycycline (VIBRAMYCIN) 100 MG capsule Take 1 capsule (100 mg total) by mouth 2 (two) times daily. 12/09/21   Scot Jun, FNP  LORazepam (ATIVAN) 0.5 MG tablet TAKE 1 TABLET (0.5 MG TOTAL) BY MOUTH AT BEDTIME AS NEEDED FOR ANXIETY OR SLEEP. 09/27/19   Ria Bush, MD  Nebulizers (COMPRESSOR/NEBULIZER) MISC 1 Units by Does not apply route every 4 (four) hours as needed. 06/02/21   Naaman Plummer, MD  promethazine-dextromethorphan (PROMETHAZINE-DM) 6.25-15 MG/5ML syrup Take 5 mLs by mouth 3 (three) times daily between meals as needed for  cough. 01/28/22   Scot Jun, FNP  tamsulosin (FLOMAX) 0.4 MG CAPS capsule TAKE 1 CAPSULE BY MOUTH EVERY DAY 01/07/22   Ria Bush, MD  valACYclovir (VALTREX) 500 MG tablet TAKE 1 TABLET BY MOUTH EVERY DAY 10/11/21   Ria Bush, MD  valsartan-hydrochlorothiazide (DIOVAN-HCT) 160-25 MG tablet Take 1 tablet by mouth daily. 01/24/21   Ria Bush, MD    Family History Family History  Adopted: Yes  Family history unknown: Yes    Social History Social History   Tobacco Use   Smoking status: Former    Types: Cigarettes    Start date: 06/1995    Quit date: 05/1996    Years since quitting: 25.7   Smokeless tobacco: Never  Vaping Use   Vaping Use: Never used  Substance Use Topics   Alcohol use: Yes    Alcohol/week: 1.0 standard drink of alcohol    Types: 1 Cans of beer per week    Comment: socially   Drug use: No    Comment: some in HS     Allergies   Patient has no known allergies.   Review of Systems Review of Systems Pertinent negatives listed in HPI   Physical Exam Triage Vital Signs ED Triage Vitals [01/28/22 0904]  Enc Vitals Group     BP (!) 129/90     Pulse Rate (!) 110     Resp 18     Temp 99 F (37.2 C)     Temp Source Oral     SpO2 94 %     Weight      Height      Head Circumference      Peak Flow      Pain Score 0     Pain Loc      Pain Edu?      Excl. in [place]?    No data found.  Updated Vital Signs BP (!) 129/90 (BP Location: Left Arm)   Pulse (!) 110   Temp 99 F (37.2 C) (Oral)   Resp 18   SpO2 94%   Visual Acuity Right Eye Distance:   Left Eye Distance:   Bilateral Distance:    Right Eye Near:   Left Eye Near:    Bilateral Near:     Physical Exam Vitals reviewed.  Constitutional:      Appearance: Normal appearance.  HENT:     Head: Normocephalic and atraumatic.     Nose: Congestion present.     Mouth/Throat:     Pharynx: No oropharyngeal exudate or posterior oropharyngeal erythema.  Eyes:      Extraocular Movements: Extraocular movements intact.     Conjunctiva/sclera: Conjunctivae normal.     Pupils: Pupils are equal, round, and reactive to light.  Cardiovascular:     Rate and Rhythm: Normal rate and regular rhythm.  Pulmonary:     Effort: Pulmonary effort is normal.  Breath sounds: Rhonchi present. No wheezing.  Musculoskeletal:     Cervical back: Normal range of motion.  Lymphadenopathy:     Cervical: No cervical adenopathy.  Skin:    General: Skin is warm and dry.     Capillary Refill: Capillary refill takes less than 2 seconds.  Neurological:     General: No focal deficit present.     Mental Status: He is alert and oriented to person, place, and time.  Psychiatric:        Mood and Affect: Mood normal.        Behavior: Behavior normal.        Thought Content: Thought content normal.      UC Treatments / Results  Labs (all labs ordered are listed, but only abnormal results are displayed) Labs Reviewed  RESP PANEL BY RT-PCR (FLU A&B, COVID) [id number]    EKG   Radiology No results found.  Procedures Procedures (including critical care time)  Medications Ordered in UC Medications  albuterol (VENTOLIN HFA) 108 (90 Base) MCG/ACT inhaler 2 puff (2 puffs Inhalation Given 01/28/22 0940)    Initial Impression / Assessment and Plan / UC Course  I have reviewed the triage vital signs and the nursing notes.  Pertinent labs & imaging results that were available during my care of the patient were reviewed by me and considered in my medical decision making (see chart for details).    COVID/Flu test pending. Refilled nebulizer solution. Prednisone prescribed for chest inflammation. Symptom management warranted only.  Manage fever with Tylenol and ibuprofen. Nasal symptoms with over-the-counter antihistamines recommended. Treatment per discharge medications/discharge instructions.  Red flags/ER precautions given. The most current CDC isolation/quarantine  recommendation advised.   Final Clinical Impressions(s) / UC Diagnoses   Final diagnoses:  Encounter for screening laboratory testing for COVID-19 virus  Viral infection of lower respiratory system  Febrile illness     Discharge Instructions      Your COVID 19 / Flu results will be available in less than 24 hours. Negative results are immediately resulted to Mychart. Positive results will receive a follow-up call from our clinic.  If you develop any difficulty breathing or severe chest pain, go to the nearest emergency department.  Use / take medication as prescribed.      ED Prescriptions     Medication Sig Dispense Auth. Provider   albuterol (VENTOLIN HFA) 108 (90 Base) MCG/ACT inhaler Inhale 2 puffs into the lungs every 6 (six) hours as needed for wheezing or shortness of breath. 1 each Scot Jun, FNP   promethazine-dextromethorphan (PROMETHAZINE-DM) 6.25-15 MG/5ML syrup Take 5 mLs by mouth 3 (three) times daily between meals as needed for cough. 140 mL Scot Jun, FNP   predniSONE (DELTASONE) 20 MG tablet Take 2 tablets (40 mg total) by mouth daily with breakfast. 10 tablet Scot Jun, FNP   albuterol (PROVENTIL) (2.5 MG/3ML) 0.083% nebulizer solution Take 3 mLs (2.5 mg total) by nebulization every 6 (six) hours as needed for wheezing or shortness of breath. 75 mL Scot Jun, FNP      PDMP not reviewed this encounter.   Scot Jun, FNP 01/28/22 1037

## 2022-02-25 ENCOUNTER — Encounter: Payer: Self-pay | Admitting: Family Medicine

## 2022-02-25 ENCOUNTER — Ambulatory Visit: Payer: 59 | Admitting: Family Medicine

## 2022-02-25 VITALS — BP 136/88 | HR 107 | Temp 97.9°F | Ht 66.0 in | Wt 284.1 lb

## 2022-02-25 DIAGNOSIS — I10 Essential (primary) hypertension: Secondary | ICD-10-CM

## 2022-02-25 DIAGNOSIS — R519 Headache, unspecified: Secondary | ICD-10-CM | POA: Insufficient documentation

## 2022-02-25 DIAGNOSIS — F39 Unspecified mood [affective] disorder: Secondary | ICD-10-CM

## 2022-02-25 MED ORDER — CYCLOBENZAPRINE HCL 5 MG PO TABS: 5.0000 mg | ORAL_TABLET | Freq: Two times a day (BID) | ORAL | 0 refills | Status: DC | PRN

## 2022-02-25 MED ORDER — BUPROPION HCL ER (XL) 150 MG PO TB24: 150.0000 mg | ORAL_TABLET | Freq: Every day | ORAL | 1 refills | Status: DC

## 2022-02-25 MED ORDER — AMLODIPINE BESYLATE 5 MG PO TABS
5.0000 mg | ORAL_TABLET | Freq: Every day | ORAL | 1 refills | Status: DC
Start: 2022-02-25 — End: 2023-03-12

## 2022-02-25 MED ORDER — METOPROLOL SUCCINATE ER 25 MG PO TB24: 25.0000 mg | ORAL_TABLET | Freq: Every day | ORAL | 1 refills | Status: DC

## 2022-02-25 MED ORDER — VALSARTAN-HYDROCHLOROTHIAZIDE 160-25 MG PO TABS: 1.0000 | ORAL_TABLET | Freq: Every day | ORAL | 1 refills | Status: DC

## 2022-02-25 NOTE — Assessment & Plan Note (Addendum)
Chronic, borderline elevated despite current regimen. With ongoing mild tachycardia, add Toprol XL '25mg'$  daily. Discussed monitoring for metoprolol/wellbutrin interaction (increased BB effect).

## 2022-02-25 NOTE — [facility] Notes (Signed)
Patient ID: Neil Mcintyre, male    DOB: 05-31-1976, 46 y.o.   MRN: [id number]  This visit was conducted in person.  BP 136/88 (BP Location: Right Arm, Cuff Size: Large)   Pulse (!) 107   Temp 97.9 F (36.6 C) (Temporal)   Ht '5\' 6"'$  (1.676 m)   Wt 284 lb 2 oz (128.9 kg)   SpO2 96%   BMI 45.86 kg/m   BP Readings from Last 3 Encounters:  02/25/22 136/88  01/28/22 (!) 129/90  12/09/21 115/70   Pulse Readings from Last 3 Encounters:  02/25/22 (!) 107  01/28/22 (!) 110  12/09/21 (!) 103    CC: headache  Subjective:   HPI: Neil Mcintyre is a 46 y.o. male presenting on 02/25/2022 for Headache (C/o constant HA.  Started 2-3 wks ago. Tried Tylenol and Excedrin, helpful. )   Seen last month at [facility] with respiratory infection. Tested negative for COVID. HA started after bad coughing episode 3-4 wks ago - since then having intermittent headache described as dull throbbing ache to left side of head - periorbital with radiation to parietal scalp. This tends to come on in the mornings. HA doesn't wake him up and he doesn't wake up with HA in the mornings. HA usually starts at work at [employer].  Last eye exam was about a year ago.   No nausea/vomiting or photo/phonophobia. No diplopia, vision changes, tinnitus. No aura. Not activity limiting. No neck pain.  HA worse with car exhaust smell.  Treating with tylenol with benefit.  At work takes excedrin.   Currently without headache.   + seasonal allergies - doesn't take anything for this. Currently rhinorrhea, itchy watery eyes, congestion.   Known HTN - managed with amlodipine '5mg'$  daily and valsartan hctz 160/'25mg'$  daily. Doesn't check BP at home  Endorses poor sleep. Endorses restlessness in his sleep. Overall non-restorative sleep. Loud snorer, + some daytime somnolence.  No PNDyspnea. No witnessed apnea.      Relevant past medical, surgical, family and social history reviewed and updated as indicated. Interim medical history since our  last visit reviewed. Allergies and medications reviewed and updated. Outpatient Medications Prior to Visit  Medication Sig Dispense Refill   LORazepam (ATIVAN) 0.5 MG tablet TAKE 1 TABLET (0.5 MG TOTAL) BY MOUTH AT BEDTIME AS NEEDED FOR ANXIETY OR SLEEP. 30 tablet 0   Nebulizers (COMPRESSOR/NEBULIZER) MISC 1 Units by Does not apply route every 4 (four) hours as needed. 1 each 0   valACYclovir (VALTREX) 500 MG tablet TAKE 1 TABLET BY MOUTH EVERY DAY 90 tablet 1   buPROPion (WELLBUTRIN XL) 150 MG 24 hr tablet Take 1 tablet (150 mg total) by mouth daily. 90 tablet 3   valsartan-hydrochlorothiazide (DIOVAN-HCT) 160-25 MG tablet Take 1 tablet by mouth daily. 90 tablet 3   albuterol (PROVENTIL) (2.5 MG/3ML) 0.083% nebulizer solution Take 3 mLs (2.5 mg total) by nebulization every 6 (six) hours as needed for wheezing or shortness of breath. (Patient not taking: Reported on 02/25/2022) 75 mL 0   albuterol (VENTOLIN HFA) 108 (90 Base) MCG/ACT inhaler Inhale 2 puffs into the lungs every 6 (six) hours as needed for wheezing or shortness of breath. (Patient not taking: Reported on 02/25/2022) 1 each 0   budesonide (PULMICORT FLEXHALER) 180 MCG/ACT inhaler Inhale 1 puff into the lungs in the morning and at bedtime. (Patient not taking: Reported on 02/25/2022) 1 each 0   amLODipine (NORVASC) 5 MG tablet Take 1 tablet (5 mg total) by  mouth daily. (Patient not taking: Reported on 02/25/2022) 90 tablet 3   azithromycin (ZITHROMAX) 250 MG tablet Take 1 tablet (250 mg total) by mouth daily. 4 each 0   chlorpheniramine-HYDROcodone (TUSSIONEX PENNKINETIC ER) 10-8 MG/5ML SUER Take 5 mLs by mouth 2 (two) times daily. 70 mL 0   doxycycline (VIBRAMYCIN) 100 MG capsule Take 1 capsule (100 mg total) by mouth 2 (two) times daily. 20 capsule 0   predniSONE (DELTASONE) 20 MG tablet Take 2 tablets (40 mg total) by mouth daily with breakfast. 10 tablet 0   promethazine-dextromethorphan (PROMETHAZINE-DM) 6.25-15 MG/5ML syrup Take 5 mLs  by mouth 3 (three) times daily between meals as needed for cough. 140 mL 0   tamsulosin (FLOMAX) 0.4 MG CAPS capsule TAKE 1 CAPSULE BY MOUTH EVERY DAY 90 capsule 0   No facility-administered medications prior to visit.     Per HPI unless specifically indicated in ROS section below Review of Systems  Objective:  BP 136/88 (BP Location: Right Arm, Cuff Size: Large)   Pulse (!) 107   Temp 97.9 F (36.6 C) (Temporal)   Ht '5\' 6"'$  (1.676 m)   Wt 284 lb 2 oz (128.9 kg)   SpO2 96%   BMI 45.86 kg/m   Wt Readings from Last 3 Encounters:  02/25/22 284 lb 2 oz (128.9 kg)  01/24/21 279 lb (126.6 kg)  09/07/19 252 lb 1 oz (114.3 kg)      Physical Exam Vitals and nursing note reviewed.  Constitutional:      Appearance: Normal appearance. He is well-developed. He is obese. He is not ill-appearing.  HENT:     Head: Normocephalic and atraumatic.     Right Ear: Tympanic membrane, ear canal and external ear normal. There is no impacted cerumen.     Left Ear: Tympanic membrane, ear canal and external ear normal. There is no impacted cerumen.     Mouth/Throat:     Mouth: Mucous membranes are moist.     Pharynx: Oropharynx is clear. No oropharyngeal exudate or posterior oropharyngeal erythema.  Eyes:     General:        Right eye: No discharge.        Left eye: No discharge.     Extraocular Movements: Extraocular movements intact.     Conjunctiva/sclera: Conjunctivae normal.     Pupils: Pupils are equal, round, and reactive to light.  Neck:     Thyroid: No thyroid mass or thyromegaly.     Vascular: No carotid bruit.     Comments: FROM cervical neck without midline or paracervical pain to palpation  Cardiovascular:     Rate and Rhythm: Normal rate and regular rhythm.     Pulses: Normal pulses.     Heart sounds: Normal heart sounds. No murmur heard. Pulmonary:     Effort: Pulmonary effort is normal. No respiratory distress.     Breath sounds: Normal breath sounds. No wheezing, rhonchi or  rales.  Musculoskeletal:     Cervical back: Normal range of motion and neck supple. No rigidity.     Right lower leg: No edema.     Left lower leg: No edema.  Skin:    General: Skin is warm and dry.  Neurological:     Mental Status: He is alert.     Cranial Nerves: Cranial nerves 2-12 are intact.     Sensory: Sensation is intact.     Motor: Motor function is intact.     Coordination: Coordination is intact.  Gait: Gait is intact.     Comments:  CN 2-12 intact FTN intact EOMI  No pronator drift  Psychiatric:        Mood and Affect: Mood normal.        Behavior: Behavior normal.       Results for orders placed or performed during the hospital encounter of 01/28/22  Resp Panel by RT-PCR (Flu A&B, Covid) Anterior Nasal Swab   Specimen: Anterior Nasal Swab  Result Value Ref Range   SARS Coronavirus 2 by RT PCR NEGATIVE NEGATIVE   Influenza A by PCR NEGATIVE NEGATIVE   Influenza B by PCR NEGATIVE NEGATIVE   Lab Results  Component Value Date   CREATININE 0.81 04/14/2019   BUN 14 04/14/2019   NA 139 04/14/2019   K 4.2 04/14/2019   CL 103 04/14/2019   CO2 31 04/14/2019    Lab Results  Component Value Date   CHOL 221 (H) 04/14/2019   HDL 54.40 04/14/2019   LDLCALC 135 (H) 04/14/2019   TRIG 158.0 (H) 04/14/2019   CHOLHDL 4 04/14/2019    Assessment & Plan:   Problem List Items Addressed This Visit     Primary hypertension    Chronic, borderline elevated despite current regimen. With ongoing mild tachycardia, add Toprol XL '25mg'$  daily. Discussed monitoring for metoprolol/wellbutrin interaction (increased BB effect).       Relevant Medications   valsartan-hydrochlorothiazide (DIOVAN-HCT) 160-25 MG tablet   amLODipine (NORVASC) 5 MG tablet   metoprolol succinate (TOPROL-XL) 25 MG 24 hr tablet   Obesity, morbid, BMI 40.0-49.9 (HCC)   Mood disorder ([place])    Continues wellbutrin XL with benefit.       Left-sided headache - Primary    L sided headache that started  after coughing fit during recent respiratory infection. Non focal neurological exam today. Not consistent with migraine.  ?primary cough headache.  ?TTH - trial flexeril with sedation precautions.  ?OSA related morning headache - ESS = 2 (low risk). Consider OSA evaluation if ongoing morning headaches given body habitus and endorsed poor sleep.  Did recommend updated eye exam to r/o eye etiology.  If ongoing headache, consider head imaging (contrasted CT or MRI) for further evaluation, r/o idiopathic intracranial hypertension or other cause.       Relevant Medications   buPROPion (WELLBUTRIN XL) 150 MG 24 hr tablet   amLODipine (NORVASC) 5 MG tablet   metoprolol succinate (TOPROL-XL) 25 MG 24 hr tablet   cyclobenzaprine (FLEXERIL) 5 MG tablet     Meds ordered this encounter  Medications   buPROPion (WELLBUTRIN XL) 150 MG 24 hr tablet    Sig: Take 1 tablet (150 mg total) by mouth daily.    Dispense:  90 tablet    Refill:  1   valsartan-hydrochlorothiazide (DIOVAN-HCT) 160-25 MG tablet    Sig: Take 1 tablet by mouth daily.    Dispense:  90 tablet    Refill:  1   amLODipine (NORVASC) 5 MG tablet    Sig: Take 1 tablet (5 mg total) by mouth daily.    Dispense:  90 tablet    Refill:  1   metoprolol succinate (TOPROL-XL) 25 MG 24 hr tablet    Sig: Take 1 tablet (25 mg total) by mouth daily.    Dispense:  90 tablet    Refill:  1   DISCONTD: cyclobenzaprine (FLEXERIL) 5 MG tablet    Sig: Take 1 tablet (5 mg total) by mouth 2 (two) times daily as needed for  muscle spasms.    Dispense:  30 tablet    Refill:  0   cyclobenzaprine (FLEXERIL) 5 MG tablet    Sig: Take 1 tablet (5 mg total) by mouth 2 (two) times daily as needed (headache).    Dispense:  30 tablet    Refill:  0    Use this sig   No orders of the defined types were placed in this encounter.    Patient Instructions  Continue current medicines, add Toprol XL '25mg'$  daily (extended release metoprolol).  For headache,  possible tension headache vs ?sleep apnea related morning headache. Try flexeril muscle relaxant with sedation precaution. We should consider sleep apnea evaluation.  Schedule eye doctor appointment.  If not improving with above, let me know to consider head imaging.  Return in 3 months for physical.   Follow up plan: Return in about 3 days (around 02/28/2022) for annual exam, prior fasting for blood work.  Ria Bush, MD

## 2022-02-25 NOTE — Patient Instructions (Addendum)
Continue current medicines, add Toprol XL '25mg'$  daily (extended release metoprolol).  For headache, possible tension headache vs ?sleep apnea related morning headache. Try flexeril muscle relaxant with sedation precaution. We should consider sleep apnea evaluation.  Schedule eye doctor appointment.  If not improving with above, let me know to consider head imaging.  Return in 3 months for physical.

## 2022-02-25 NOTE — Assessment & Plan Note (Addendum)
L sided headache that started after coughing fit during recent respiratory infection. Non focal neurological exam today. Not consistent with migraine.  ?primary cough headache.  ?TTH - trial flexeril with sedation precautions.  ?OSA related morning headache - ESS = 2 (low risk). Consider OSA evaluation if ongoing morning headaches given body habitus and endorsed poor sleep.  Did recommend updated eye exam to r/o eye etiology.  If ongoing headache, consider head imaging (contrasted CT or MRI) for further evaluation, r/o idiopathic intracranial hypertension or other cause.

## 2022-02-25 NOTE — Assessment & Plan Note (Signed)
Continues wellbutrin XL with benefit.

## 2022-05-23 ENCOUNTER — Other Ambulatory Visit: Payer: Self-pay

## 2022-05-23 NOTE — Telephone Encounter (Signed)
Valtrex  Last rx: 10/11/21, #90 Last OV: 02/25/22, HA Next OV: 06/05/22, CPE

## 2022-05-24 MED ORDER — VALACYCLOVIR HCL 500 MG PO TABS: 500.0000 mg | ORAL_TABLET | Freq: Every day | ORAL | 1 refills | Status: DC

## 2022-06-05 ENCOUNTER — Encounter: Payer: PRIVATE HEALTH INSURANCE | Admitting: Family Medicine

## 2022-06-08 ENCOUNTER — Emergency Department: Payer: 59

## 2022-06-08 ENCOUNTER — Other Ambulatory Visit: Payer: Self-pay

## 2022-06-08 ENCOUNTER — Inpatient Hospital Stay
Admission: EM | Admit: 2022-06-08 | Discharge: 2022-06-13 | DRG: 378 | Disposition: A | Discharge status: Home / Self Care | Payer: 59 | Attending: Internal Medicine | Admitting: Internal Medicine

## 2022-06-08 DIAGNOSIS — Z86718 Personal history of other venous thrombosis and embolism: Secondary | ICD-10-CM

## 2022-06-08 DIAGNOSIS — Z79899 Other long term (current) drug therapy: Secondary | ICD-10-CM

## 2022-06-08 DIAGNOSIS — F17210 Nicotine dependence, cigarettes, uncomplicated: Secondary | ICD-10-CM | POA: Diagnosis present

## 2022-06-08 DIAGNOSIS — K529 Noninfective gastroenteritis and colitis, unspecified: Secondary | ICD-10-CM | POA: Diagnosis not present

## 2022-06-08 DIAGNOSIS — Z8719 Personal history of other diseases of the digestive system: Secondary | ICD-10-CM | POA: Diagnosis present

## 2022-06-08 DIAGNOSIS — I10 Essential (primary) hypertension: Secondary | ICD-10-CM | POA: Diagnosis present

## 2022-06-08 DIAGNOSIS — E785 Hyperlipidemia, unspecified: Secondary | ICD-10-CM | POA: Diagnosis present

## 2022-06-08 DIAGNOSIS — E876 Hypokalemia: Secondary | ICD-10-CM | POA: Diagnosis present

## 2022-06-08 DIAGNOSIS — F32A Depression, unspecified: Secondary | ICD-10-CM | POA: Diagnosis present

## 2022-06-08 DIAGNOSIS — Z6841 Body Mass Index (BMI) 40.0 and over, adult: Secondary | ICD-10-CM

## 2022-06-08 DIAGNOSIS — E1169 Type 2 diabetes mellitus with other specified complication: Secondary | ICD-10-CM | POA: Diagnosis present

## 2022-06-08 DIAGNOSIS — F419 Anxiety disorder, unspecified: Secondary | ICD-10-CM | POA: Diagnosis present

## 2022-06-08 DIAGNOSIS — K640 First degree hemorrhoids: Secondary | ICD-10-CM | POA: Diagnosis present

## 2022-06-08 DIAGNOSIS — K5731 Diverticulosis of large intestine without perforation or abscess with bleeding: Secondary | ICD-10-CM | POA: Diagnosis not present

## 2022-06-08 DIAGNOSIS — K922 Gastrointestinal hemorrhage, unspecified: Secondary | ICD-10-CM | POA: Diagnosis present

## 2022-06-08 DIAGNOSIS — A09 Infectious gastroenteritis and colitis, unspecified: Secondary | ICD-10-CM | POA: Diagnosis present

## 2022-06-08 DIAGNOSIS — D509 Iron deficiency anemia, unspecified: Secondary | ICD-10-CM | POA: Insufficient documentation

## 2022-06-08 DIAGNOSIS — E1165 Type 2 diabetes mellitus with hyperglycemia: Secondary | ICD-10-CM | POA: Diagnosis present

## 2022-06-08 DIAGNOSIS — D124 Benign neoplasm of descending colon: Secondary | ICD-10-CM | POA: Diagnosis present

## 2022-06-08 DIAGNOSIS — F39 Unspecified mood [affective] disorder: Secondary | ICD-10-CM | POA: Diagnosis present

## 2022-06-08 DIAGNOSIS — R739 Hyperglycemia, unspecified: Secondary | ICD-10-CM | POA: Diagnosis present

## 2022-06-08 DIAGNOSIS — K219 Gastro-esophageal reflux disease without esophagitis: Secondary | ICD-10-CM | POA: Diagnosis present

## 2022-06-08 DIAGNOSIS — K5791 Diverticulosis of intestine, part unspecified, without perforation or abscess with bleeding: Secondary | ICD-10-CM | POA: Insufficient documentation

## 2022-06-08 DIAGNOSIS — J45909 Unspecified asthma, uncomplicated: Secondary | ICD-10-CM | POA: Diagnosis present

## 2022-06-08 DIAGNOSIS — D62 Acute posthemorrhagic anemia: Secondary | ICD-10-CM | POA: Insufficient documentation

## 2022-06-08 DIAGNOSIS — Z9049 Acquired absence of other specified parts of digestive tract: Secondary | ICD-10-CM

## 2022-06-08 LAB — COMPREHENSIVE METABOLIC PANEL
ALT: 24 U/L (ref 0–44)
AST: 26 U/L (ref 15–41)
Albumin: 3.7 g/dL (ref 3.5–5.0)
Alkaline Phosphatase: 84 U/L (ref 38–126)
Anion gap: 10 (ref 5–15)
BUN: 15 mg/dL (ref 6–20)
CO2: 24 mmol/L (ref 22–32)
Calcium: 9.3 mg/dL (ref 8.9–10.3)
Chloride: 103 mmol/L (ref 98–111)
Creatinine, Ser: 0.87 mg/dL (ref 0.61–1.24)
GFR, Estimated: >60 mL/min (ref >60)
Glucose, Bld: 192 mg/dL — ABNORMAL HIGH (ref 70–99)
Potassium: 3.4 mmol/L — ABNORMAL LOW (ref 3.5–5.1)
Sodium: 137 mmol/L (ref 135–145)
Total Bilirubin: 0.7 mg/dL (ref 0.3–1.2)
Total Protein: 7 g/dL (ref 6.5–8.1)

## 2022-06-08 LAB — CBC
HCT: 50.6 % (ref 39.0–52.0)
Hemoglobin: 16.3 g/dL (ref 13.0–17.0)
MCH: 29 pg (ref 26.0–34.0)
MCHC: 32.2 g/dL (ref 30.0–36.0)
MCV: 90 fL (ref 80.0–100.0)
Platelets: 302 10*3/uL (ref 150–400)
RBC: 5.62 MIL/uL (ref 4.22–5.81)
RDW: 14.8 % (ref 11.5–15.5)
WBC: 10.7 10*3/uL — ABNORMAL HIGH (ref 4.0–10.5)
nRBC: 0 % (ref 0.0–0.2)

## 2022-06-08 MED ORDER — IOHEXOL 350 MG/ML SOLN
100.0000 mL | Freq: Once | INTRAVENOUS | Status: AC | PRN
  Administered 2022-06-08: 100 mL via INTRAVENOUS

## 2022-06-08 MED ORDER — SODIUM CHLORIDE 0.9 % IV SOLN: INTRAVENOUS | Status: DC

## 2022-06-08 NOTE — ED Triage Notes (Signed)
Pt arrives via POV with CC of bright red rectal bleeding that began at approx 1900 today. Reports hx of same - received 4 units of blood and ICU admission. Pt denies CP, SOB, dizziness, and lightheadedness at this time.

## 2022-06-08 NOTE — ED Provider Notes (Signed)
[facility] Provider Note    Event Date/Time   First MD Initiated Contact with Patient 06/08/22 2344     (approximate)   History   Rectal Bleeding   HPI  Neil Mcintyre is a 47 y.o. male with history of previous diverticulitis with bleeding and perforation requiring ICU admission and blood transfusion, sigmoid colectomy in 2017 who presents to the emergency department with rectal bleeding that started at 7 PM tonight.  States he had 2 large bloody bowel movements with bright red blood.  States he feels like he could have another now.  No fevers, vomiting, abdominal pain.  No recent antibiotic use or sick contacts.  No recent travel.   History provided by patient and wife.    Past Medical History:  Diagnosis Date   Anxiety    Diverticulitis of large intestine with perforation with bleeding 2015, 2017   Laparoscopic sigmoid colectomy 2017   Genital herpes    Hypertension    Irritability and anger    due to stress    Past Surgical History:  Procedure Laterality Date   COLONOSCOPY W/ POLYPECTOMY  09/2014   diverticulosis ([place])   CYSTOSCOPY W/ RETROGRADES Bilateral 08/16/2015   [name], MD   CYSTOSCOPY WITH STENT PLACEMENT Bilateral 08/16/2015   [name], MD   LAPAROSCOPIC SIGMOID COLECTOMY N/A 08/16/2015   recurrent diverticulitis; [name], MD   REFRACTIVE SURGERY  1999   LASIK    MEDICATIONS:  Prior to Admission medications   Medication Sig Start Date End Date Taking? Authorizing Provider  albuterol (PROVENTIL) (2.5 MG/3ML) 0.083% nebulizer solution Take 3 mLs (2.5 mg total) by nebulization every 6 (six) hours as needed for wheezing or shortness of breath. Patient not taking: Reported on 02/25/2022 01/28/22   Scot Jun, FNP  albuterol (VENTOLIN HFA) 108 (90 Base) MCG/ACT inhaler Inhale 2 puffs into the lungs every 6 (six) hours as needed for wheezing or shortness of breath. Patient not taking: Reported on  02/25/2022 01/28/22   Scot Jun, FNP  amLODipine (NORVASC) 5 MG tablet Take 1 tablet (5 mg total) by mouth daily. 02/25/22   Ria Bush, MD  budesonide (PULMICORT FLEXHALER) 180 MCG/ACT inhaler Inhale 1 puff into the lungs in the morning and at bedtime. Patient not taking: Reported on 02/25/2022 05/22/21   Ria Bush, MD  buPROPion (WELLBUTRIN XL) 150 MG 24 hr tablet Take 1 tablet (150 mg total) by mouth daily. 02/25/22   Ria Bush, MD  cyclobenzaprine (FLEXERIL) 5 MG tablet Take 1 tablet (5 mg total) by mouth 2 (two) times daily as needed (headache). 02/25/22   Ria Bush, MD  LORazepam (ATIVAN) 0.5 MG tablet TAKE 1 TABLET (0.5 MG TOTAL) BY MOUTH AT BEDTIME AS NEEDED FOR ANXIETY OR SLEEP. 09/27/19   Ria Bush, MD  metoprolol succinate (TOPROL-XL) 25 MG 24 hr tablet Take 1 tablet (25 mg total) by mouth daily. 02/25/22   Ria Bush, MD  Nebulizers (COMPRESSOR/NEBULIZER) MISC 1 Units by Does not apply route every 4 (four) hours as needed. 06/02/21   Naaman Plummer, MD  valACYclovir (VALTREX) 500 MG tablet Take 1 tablet (500 mg total) by mouth daily. 05/24/22   Ria Bush, MD  valsartan-hydrochlorothiazide (DIOVAN-HCT) 160-25 MG tablet Take 1 tablet by mouth daily. 02/25/22   Ria Bush, MD    Physical Exam   Triage Vital Signs: ED Triage Vitals  Enc Vitals Group     BP 06/08/22 2000 (!) 164/102  Pulse Rate 06/08/22 2000 (!) 115     Resp 06/08/22 2000 17     Temp 06/08/22 2000 98.2 F (36.8 C)     Temp Source 06/08/22 2000 Oral     SpO2 06/08/22 2000 96 %     Weight 06/08/22 1958 270 lb (122.5 kg)     Height 06/08/22 1958 '5\' 6"'$  (1.676 m)     Head Circumference --      Peak Flow --      Pain Score 06/08/22 1958 0     Pain Loc --      Pain Edu? --      Excl. in [place]? --     Most recent vital signs: Vitals:   06/08/22 2342 06/09/22 0018  BP: (!) 137/90 (!) 144/77  Pulse: (!) 110   Resp: 18 16  Temp: 98.2 F (36.8 C)    SpO2: 91% 92%    CONSTITUTIONAL: Alert and oriented and responds appropriately to questions. Well-appearing; well-nourished HEAD: Normocephalic, atraumatic EYES: Conjunctivae clear, pupils appear equal, sclera nonicteric ENT: normal nose; moist mucous membranes NECK: Supple, normal ROM CARD: Regular and tachycardic; S1 and S2 appreciated; no murmurs, no clicks, no rubs, no gallops RESP: Normal chest excursion without splinting or tachypnea; breath sounds clear and equal bilaterally; no wheezes, no rhonchi, no rales, no hypoxia or respiratory distress, speaking full sentences ABD/GI: Normal bowel sounds; non-distended; soft, non-tender, no rebound, no guarding, no peritoneal signs RECTAL:  Normal rectal tone,+ large amount of gross red blood on rectal exam, no melena, no hemorrhoids appreciated. BACK: The back appears normal EXT: Normal ROM in all joints; no deformity noted, no edema; no cyanosis SKIN: Normal color for age and race; warm; no rash on exposed skin NEURO: Moves all extremities equally, normal speech PSYCH: The patient's mood and manner are appropriate.   ED Results / Procedures / Treatments   LABS: (all labs ordered are listed, but only abnormal results are displayed) Labs Reviewed  COMPREHENSIVE METABOLIC PANEL - Abnormal; Notable for the following components:      Result Value   Potassium 3.4 (*)    Glucose, Bld 192 (*)    All other components within normal limits  CBC - Abnormal; Notable for the following components:   WBC 10.7 (*)    All other components within normal limits  GASTROINTESTINAL PANEL BY PCR, STOOL (REPLACES STOOL CULTURE)  PROCALCITONIN  HEMOGLOBIN A1C  HIV ANTIBODY (ROUTINE TESTING W REFLEX)  BASIC METABOLIC PANEL  CBC  CBC  CBC  CBC  TYPE AND SCREEN     EKG:   RADIOLOGY: My personal review and interpretation of imaging: CT scan shows enteritis.  No signs of active bleeding.  I have personally reviewed all radiology reports.   CT  ANGIO GI BLEED  Result Date: 06/08/2022 CLINICAL DATA:  Bloody stool EXAM: CTA ABDOMEN AND PELVIS WITHOUT AND WITH CONTRAST TECHNIQUE: Multidetector CT imaging of the abdomen and pelvis was performed using the standard protocol during bolus administration of intravenous contrast. Multiplanar reconstructed images and MIPs were obtained and reviewed to evaluate the vascular anatomy. RADIATION DOSE REDUCTION: This exam was performed according to the departmental dose-optimization program which includes automated exposure control, adjustment of the mA and/or kV according to patient size and/or use of iterative reconstruction technique. CONTRAST:  144m OMNIPAQUE IOHEXOL 350 MG/ML SOLN COMPARISON:  CT abdomen and pelvis 07/05/2015 FINDINGS: VASCULAR Aorta: Normal caliber aorta without aneurysm, dissection, vasculitis or significant stenosis. Celiac: Patent without evidence of aneurysm, dissection, vasculitis  or significant stenosis. SMA: Patent without evidence of aneurysm, dissection, vasculitis or significant stenosis. Renals: Both renal arteries are patent without evidence of aneurysm, dissection, vasculitis, fibromuscular dysplasia or significant stenosis. Bilateral accessory renal arteries are present. IMA: Patent without evidence of aneurysm, dissection, vasculitis or significant stenosis. Inflow: Patent without evidence of aneurysm, dissection, vasculitis or significant stenosis. Proximal Outflow: Bilateral common femoral and visualized portions of the superficial and profunda femoral arteries are patent without evidence of aneurysm, dissection, vasculitis or significant stenosis. Veins: No obvious venous abnormality within the limitations of this arterial phase study. Review of the MIP images confirms the above findings. NON-VASCULAR Lower chest: No acute abnormality. Hepatobiliary: No focal liver abnormality is seen. No gallstones, gallbladder wall thickening, or biliary dilatation. Pancreas: Unremarkable. No  pancreatic ductal dilatation or surrounding inflammatory changes. Spleen: Normal in size without focal abnormality. Adrenals/Urinary Tract: Adrenal glands are unremarkable. There is no hydronephrosis or perinephric fat stranding. 1 cm cyst is noted in the right kidney. Bladder is unremarkable. Stomach/Bowel: There is no evidence for bowel obstruction, pneumatosis or free air. No active gastrointestinal bleeding identified. There is a sigmoid colon anastomosis. There is descending colon and transverse colon diverticulosis without evidence for diverticulitis. The stomach appears within normal limits. There is a short loop of prominent small bowel in the right mid abdomen demonstrating wall thickening with some adjacent mesenteric lymphadenopathy. This is new from prior examination and best seen on axial image 9/114. Lymphatic: No enlarged lymph nodes are identified in the retroperitoneum or pelvis. Reproductive: Prostate is unremarkable. Other: No abdominal wall hernia or abnormality. No abdominopelvic ascites. Musculoskeletal: No acute or significant osseous findings. IMPRESSION: 1. No evidence for active gastrointestinal bleeding. NON-VASCULAR 1. Short loop of prominent small bowel in the right mid abdomen with wall thickening and adjacent mesenteric lymphadenopathy. Findings are concerning for focal enteritis. Follow-up recommended to exclude underlying etiologies. 2. Colonic diverticulosis without evidence for diverticulitis. Electronically Signed   By: Ronney Asters M.D.   On: 06/08/2022 23:18     PROCEDURES:  Critical Care performed: No      .1-3 Lead EKG Interpretation  Performed by: Makenly Larabee, Delice Bison, DO Authorized by: Nirvana Blanchett, Delice Bison, DO     Interpretation: abnormal     ECG rate:  110   ECG rate assessment: tachycardic     Rhythm: sinus tachycardia     Ectopy: none     Conduction: normal       IMPRESSION / MDM / ASSESSMENT AND PLAN / ED COURSE  I reviewed the triage vital signs and the  nursing notes.    Patient here with GI bleed.  The patient is on the cardiac monitor to evaluate for evidence of arrhythmia and/or significant heart rate changes.   DIFFERENTIAL DIAGNOSIS (includes but not limited to):   Diverticular bleed, hemorrhoids, colitis, enteritis, anemia   Patient's presentation is most consistent with acute presentation with potential threat to life or bodily function.   PLAN: Workup initiated from triage.  Slight leukocytosis of 10.7 but hemoglobin normal at 16.3.  Normal renal function, LFTs.  CT scan reviewed and interpreted by myself and the radiologist and shows enteritis but no other acute abnormality or active bleeding.  He does have colonic diverticulosis but no diverticulitis.  He continues to be tachycardic but normal blood pressures.  Patient on cardiac monitoring.  Will admit for observation.  Will obtain stool studies.  Low suspicion for C. difficile.  Will add on procalcitonin to see if we need to give him  antibiotics at this time for his enteritis.  Otherwise well-appearing, nontoxic in appearance.  MEDICATIONS GIVEN IN ED: Medications  0.9 %  sodium chloride infusion ( Intravenous New Bag/Given 06/09/22 0013)  lactated ringers infusion (has no administration in time range)  ondansetron (ZOFRAN) tablet 4 mg (has no administration in time range)    Or  ondansetron (ZOFRAN) injection 4 mg (has no administration in time range)  pantoprazole (PROTONIX) injection 40 mg (has no administration in time range)  iohexol (OMNIPAQUE) 350 MG/ML injection 100 mL (100 mLs Intravenous Contrast Given 06/08/22 2249)     ED COURSE:  Consulted and discussed patient's case with hospitalist, Dr. Jonelle Sidle.  I have recommended admission and consulting physician agrees and will place admission orders.  Patient (and family if present) agree with this plan.   I reviewed all nursing notes, vitals, pertinent previous records.  All labs, EKGs, imaging ordered have been  independently reviewed and interpreted by myself.       OUTSIDE RECORDS REVIEWED: Reviewed patient's previous admissions in 2016 and 2017.       FINAL CLINICAL IMPRESSION(S) / ED DIAGNOSES   Final diagnoses:  Lower GI bleed  Enteritis     Rx / DC Orders   ED Discharge Orders     None        Note:  This document was prepared using Dragon voice recognition software and may include unintentional dictation errors.   Gabreil [place], Delice Bison, DO 06/09/22 [phone]

## 2022-06-08 NOTE — ED Notes (Signed)
Pt having bowel movement. Reports blood in stool.

## 2022-06-09 DIAGNOSIS — F39 Unspecified mood [affective] disorder: Secondary | ICD-10-CM | POA: Diagnosis present

## 2022-06-09 DIAGNOSIS — Z86718 Personal history of other venous thrombosis and embolism: Secondary | ICD-10-CM | POA: Diagnosis not present

## 2022-06-09 DIAGNOSIS — K219 Gastro-esophageal reflux disease without esophagitis: Secondary | ICD-10-CM | POA: Diagnosis present

## 2022-06-09 DIAGNOSIS — Z8719 Personal history of other diseases of the digestive system: Secondary | ICD-10-CM | POA: Diagnosis present

## 2022-06-09 DIAGNOSIS — K529 Noninfective gastroenteritis and colitis, unspecified: Secondary | ICD-10-CM | POA: Diagnosis present

## 2022-06-09 DIAGNOSIS — K640 First degree hemorrhoids: Secondary | ICD-10-CM | POA: Diagnosis present

## 2022-06-09 DIAGNOSIS — D62 Acute posthemorrhagic anemia: Secondary | ICD-10-CM | POA: Diagnosis present

## 2022-06-09 DIAGNOSIS — A09 Infectious gastroenteritis and colitis, unspecified: Secondary | ICD-10-CM | POA: Diagnosis present

## 2022-06-09 DIAGNOSIS — D124 Benign neoplasm of descending colon: Secondary | ICD-10-CM | POA: Diagnosis present

## 2022-06-09 DIAGNOSIS — Z6841 Body Mass Index (BMI) 40.0 and over, adult: Secondary | ICD-10-CM | POA: Diagnosis not present

## 2022-06-09 DIAGNOSIS — F17210 Nicotine dependence, cigarettes, uncomplicated: Secondary | ICD-10-CM | POA: Diagnosis present

## 2022-06-09 DIAGNOSIS — Z9049 Acquired absence of other specified parts of digestive tract: Secondary | ICD-10-CM | POA: Diagnosis not present

## 2022-06-09 DIAGNOSIS — I10 Essential (primary) hypertension: Secondary | ICD-10-CM | POA: Diagnosis present

## 2022-06-09 DIAGNOSIS — E785 Hyperlipidemia, unspecified: Secondary | ICD-10-CM

## 2022-06-09 DIAGNOSIS — D500 Iron deficiency anemia secondary to blood loss (chronic): Secondary | ICD-10-CM | POA: Diagnosis not present

## 2022-06-09 DIAGNOSIS — K625 Hemorrhage of anus and rectum: Secondary | ICD-10-CM | POA: Diagnosis not present

## 2022-06-09 DIAGNOSIS — Z79899 Other long term (current) drug therapy: Secondary | ICD-10-CM | POA: Diagnosis not present

## 2022-06-09 DIAGNOSIS — K922 Gastrointestinal hemorrhage, unspecified: Principal | ICD-10-CM | POA: Diagnosis present

## 2022-06-09 DIAGNOSIS — J45909 Unspecified asthma, uncomplicated: Secondary | ICD-10-CM | POA: Diagnosis present

## 2022-06-09 DIAGNOSIS — R739 Hyperglycemia, unspecified: Secondary | ICD-10-CM | POA: Diagnosis present

## 2022-06-09 DIAGNOSIS — E1165 Type 2 diabetes mellitus with hyperglycemia: Secondary | ICD-10-CM | POA: Diagnosis present

## 2022-06-09 DIAGNOSIS — E876 Hypokalemia: Secondary | ICD-10-CM | POA: Diagnosis not present

## 2022-06-09 DIAGNOSIS — F32A Depression, unspecified: Secondary | ICD-10-CM | POA: Diagnosis present

## 2022-06-09 DIAGNOSIS — F419 Anxiety disorder, unspecified: Secondary | ICD-10-CM | POA: Diagnosis present

## 2022-06-09 DIAGNOSIS — K5731 Diverticulosis of large intestine without perforation or abscess with bleeding: Secondary | ICD-10-CM | POA: Diagnosis present

## 2022-06-09 LAB — GASTROINTESTINAL PANEL BY PCR, STOOL (REPLACES STOOL CULTURE)

## 2022-06-09 LAB — BASIC METABOLIC PANEL
Anion gap: 8 (ref 5–15)
BUN: 17 mg/dL (ref 6–20)
CO2: 24 mmol/L (ref 22–32)
Calcium: 9.6 mg/dL (ref 8.9–10.3)
Chloride: 107 mmol/L (ref 98–111)
Creatinine, Ser: 0.88 mg/dL (ref 0.61–1.24)
GFR, Estimated: >60 mL/min (ref >60)
Glucose, Bld: 104 mg/dL — ABNORMAL HIGH (ref 70–99)
Potassium: 3.9 mmol/L (ref 3.5–5.1)
Sodium: 139 mmol/L (ref 135–145)

## 2022-06-09 LAB — CBC
HCT: 49.5 % (ref 39.0–52.0)
HCT: 45.6 % (ref 39.0–52.0)
HCT: 46.1 % (ref 39.0–52.0)
Hemoglobin: 16 g/dL (ref 13.0–17.0)
Hemoglobin: 14.4 g/dL (ref 13.0–17.0)
Hemoglobin: 14.6 g/dL (ref 13.0–17.0)
MCH: 29 pg (ref 26.0–34.0)
MCH: 28.9 pg (ref 26.0–34.0)
MCH: 28.9 pg (ref 26.0–34.0)
MCHC: 32.3 g/dL (ref 30.0–36.0)
MCHC: 31.6 g/dL (ref 30.0–36.0)
MCHC: 31.7 g/dL (ref 30.0–36.0)
MCV: 89.8 fL (ref 80.0–100.0)
MCV: 91.6 fL (ref 80.0–100.0)
MCV: 91.3 fL (ref 80.0–100.0)
Platelets: 288 10*3/uL (ref 150–400)
Platelets: 259 10*3/uL (ref 150–400)
Platelets: 269 10*3/uL (ref 150–400)
RBC: 5.51 MIL/uL (ref 4.22–5.81)
RBC: 4.98 MIL/uL (ref 4.22–5.81)
RBC: 5.05 MIL/uL (ref 4.22–5.81)
RDW: 14.9 % (ref 11.5–15.5)
RDW: 15.2 % (ref 11.5–15.5)
RDW: 15.2 % (ref 11.5–15.5)
WBC: 10.8 10*3/uL — ABNORMAL HIGH (ref 4.0–10.5)
WBC: 9.8 10*3/uL (ref 4.0–10.5)
WBC: 10 10*3/uL (ref 4.0–10.5)
nRBC: 0 % (ref 0.0–0.2)
nRBC: 0 % (ref 0.0–0.2)
nRBC: 0 % (ref 0.0–0.2)

## 2022-06-09 LAB — HIV ANTIBODY (ROUTINE TESTING W REFLEX): HIV Screen 4th Generation wRfx: NONREACTIVE

## 2022-06-09 LAB — PROCALCITONIN: Procalcitonin: <0.1 ng/mL

## 2022-06-09 MED ORDER — PANTOPRAZOLE SODIUM 40 MG IV SOLR
40.0000 mg | Freq: Two times a day (BID) | INTRAVENOUS | Status: DC
  Administered 2022-06-09 (×2): 40 mg via INTRAVENOUS
  Filled 2022-06-09 (×2): qty 10

## 2022-06-09 MED ORDER — ONDANSETRON HCL 4 MG PO TABS: 4.0000 mg | ORAL_TABLET | Freq: Four times a day (QID) | ORAL | Status: DC | PRN

## 2022-06-09 MED ORDER — LACTATED RINGERS IV SOLN: INTRAVENOUS | Status: DC

## 2022-06-09 MED ORDER — ONDANSETRON HCL 4 MG/2ML IJ SOLN: 4.0000 mg | Freq: Four times a day (QID) | INTRAMUSCULAR | Status: DC | PRN

## 2022-06-09 NOTE — ED Notes (Signed)
ED TO INPATIENT HANDOFF REPORT  ED Nurse Name and Phone #:   S Name/Age/Gender Neil Mcintyre 47 y.o. male Room/Bed: ED37A/ED37A  Code Status   Code Status: Full Code  Home/SNF/Other Home Patient oriented to: self, place, time, and situation Is this baseline? Yes   Triage Complete: Triage complete  Chief Complaint GI bleed [K92.2] Lower GI bleed [K92.2]  Triage Note Pt arrives via POV with CC of bright red rectal bleeding that began at approx 1900 today. Reports hx of same - received 4 units of blood and ICU admission. Pt denies CP, SOB, dizziness, and lightheadedness at this time.   Allergies No Known Allergies  Level of Care/Admitting Diagnosis ED Disposition     ED Disposition  Admit   Condition  --   Olga: [place] [[id number]]  Level of Care: Med-Surg [16]  Covid Evaluation: Asymptomatic - no recent exposure (last 10 days) testing not required  Diagnosis: Lower GI bleed [213086]  Admitting Physician: Enzo Bi [[id number]]  Attending Physician: Enzo Bi [[id number]]  Certification:: I certify this patient will need inpatient services for at least 2 midnights          B Medical/Surgery History Past Medical History:  Diagnosis Date   Anxiety    Diverticulitis of large intestine with perforation with bleeding 2015, 2017   Laparoscopic sigmoid colectomy 2017   Genital herpes    Hypertension    Irritability and anger    due to stress   Past Surgical History:  Procedure Laterality Date   COLONOSCOPY W/ POLYPECTOMY  09/2014   diverticulosis ([place])   CYSTOSCOPY W/ RETROGRADES Bilateral 08/16/2015   [name], MD   CYSTOSCOPY WITH STENT PLACEMENT Bilateral 08/16/2015   [name], MD   LAPAROSCOPIC SIGMOID COLECTOMY N/A 08/16/2015   recurrent diverticulitis; [name], MD   [place]   LASIK     A IV Location/Drains/Wounds Patient Lines/Drains/Airways Status     Active  Line/Drains/Airways     Name Placement date Placement time Site Days   Peripheral IV 06/08/22 20 G Right Antecubital 06/08/22  2235  Antecubital  1   Peripheral IV 06/09/22 20 G Anterior;Left;Proximal Forearm 06/09/22  0202  Forearm  less than 1   Incision (Closed) 08/16/15 Abdomen 08/16/15  1036  -- 2489   Incision - 5 Ports Abdomen Umbilicus Right Right;Lower Lower;Left Other (Comment);Mid 08/16/15  --  -- 2489            Intake/Output Last 24 hours No intake or output data in the 24 hours ending 06/09/22 1910  Labs/Imaging Results for orders placed or performed during the hospital encounter of 06/08/22 (from the past 48 hour(s))  Comprehensive metabolic panel     Status: Abnormal   Collection Time: 06/08/22  8:01 PM  Result Value Ref Range   Sodium 137 135 - 145 mmol/L   Potassium 3.4 (L) 3.5 - 5.1 mmol/L   Chloride 103 98 - 111 mmol/L   CO2 24 22 - 32 mmol/L   Glucose, Bld 192 (H) 70 - 99 mg/dL    Comment: Glucose reference range applies only to samples taken after fasting for at least 8 hours.   BUN 15 6 - 20 mg/dL   Creatinine, Ser 0.87 0.61 - 1.24 mg/dL   Calcium 9.3 8.9 - 10.3 mg/dL   Total Protein 7.0 6.5 - 8.1 g/dL   Albumin 3.7 3.5 - 5.0 g/dL   AST 26 15 - 41  U/L   ALT 24 0 - 44 U/L   Alkaline Phosphatase 84 38 - 126 U/L   Total Bilirubin 0.7 0.3 - 1.2 mg/dL   GFR, Estimated >60 >60 mL/min    Comment: (NOTE) Calculated using the CKD-EPI Creatinine Equation (2021)    Anion gap 10 5 - 15    Comment: Performed at [facility], [address] [place], [place] [address]  CBC     Status: Abnormal   Collection Time: 06/08/22  8:01 PM  Result Value Ref Range   WBC 10.7 (H) 4.0 - 10.5 K/uL   RBC 5.62 4.22 - 5.81 MIL/uL   Hemoglobin 16.3 13.0 - 17.0 g/dL   HCT 50.6 39.0 - 52.0 %   MCV 90.0 80.0 - 100.0 fL   MCH 29.0 26.0 - 34.0 pg   MCHC 32.2 30.0 - 36.0 g/dL   RDW 14.8 11.5 - 15.5 %   Platelets 302 150 - 400 K/uL   nRBC 0.0 0.0 - 0.2 %    Comment:  Performed at [facility], [address] [place], [place] [address]  Type and screen [place]     Status: None   Collection Time: 06/08/22  8:01 PM  Result Value Ref Range   ABO/RH(D) O POS    Antibody Screen NEG    Sample Expiration      06/11/2022,2359 Performed at [facility], [address] [place], [place] [address]   Procalcitonin - Baseline     Status: None   Collection Time: 06/08/22  8:01 PM  Result Value Ref Range   Procalcitonin <0.10 ng/mL    Comment:        Interpretation: PCT (Procalcitonin) <= 0.5 ng/mL: Systemic infection (sepsis) is not likely. Local bacterial infection is possible. (NOTE)       Sepsis PCT Algorithm           Lower Respiratory Tract                                      Infection PCT Algorithm    ----------------------------     ----------------------------         PCT < 0.25 ng/mL                PCT < 0.10 ng/mL          Strongly encourage             Strongly discourage   discontinuation of antibiotics    initiation of antibiotics    ----------------------------     -----------------------------       PCT 0.25 - 0.50 ng/mL            PCT 0.10 - 0.25 ng/mL               OR       >80% decrease in PCT            Discourage initiation of                                            antibiotics      Encourage discontinuation           of antibiotics    ----------------------------     -----------------------------  PCT >= 0.50 ng/mL              PCT 0.26 - 0.50 ng/mL               AND        <80% decrease in PCT             Encourage initiation of                                             antibiotics       Encourage continuation           of antibiotics    ----------------------------     -----------------------------        PCT >= 0.50 ng/mL                  PCT > 0.50 ng/mL               AND         increase in PCT                  Strongly encourage                                       initiation of antibiotics    Strongly encourage escalation           of antibiotics                                     -----------------------------                                           PCT <= 0.25 ng/mL                                                 OR                                        > 80% decrease in PCT                                      Discontinue / Do not initiate                                             antibiotics  Performed at [facility], [address] [place], [place] [address]   Gastrointestinal Panel by PCR , Stool     Status: None   Collection Time: 06/09/22  1:25 AM   Specimen: Stool  Result Value Ref Range   Campylobacter species NOT DETECTED NOT DETECTED   Plesimonas shigelloides NOT DETECTED NOT DETECTED   Salmonella species  NOT DETECTED NOT DETECTED   Yersinia enterocolitica NOT DETECTED NOT DETECTED   Vibrio species NOT DETECTED NOT DETECTED   Vibrio cholerae NOT DETECTED NOT DETECTED   Enteroaggregative E coli (EAEC) NOT DETECTED NOT DETECTED   Enteropathogenic E coli (EPEC) NOT DETECTED NOT DETECTED   Enterotoxigenic E coli (ETEC) NOT DETECTED NOT DETECTED   Shiga like toxin producing E coli (STEC) NOT DETECTED NOT DETECTED   Shigella/Enteroinvasive E coli (EIEC) NOT DETECTED NOT DETECTED   Cryptosporidium NOT DETECTED NOT DETECTED   Cyclospora cayetanensis NOT DETECTED NOT DETECTED   Entamoeba histolytica NOT DETECTED NOT DETECTED   Giardia lamblia NOT DETECTED NOT DETECTED   Adenovirus F40/41 NOT DETECTED NOT DETECTED   Astrovirus NOT DETECTED NOT DETECTED   Norovirus GI/GII NOT DETECTED NOT DETECTED   Rotavirus A NOT DETECTED NOT DETECTED   Sapovirus (I, II, IV, and V) NOT DETECTED NOT DETECTED    Comment: Performed at [facility], [address] [place], [place] [address]  HIV Antibody (routine testing w rflx)     Status: None   Collection Time: 06/09/22  1:55 AM  Result Value Ref Range   HIV Screen 4th  Generation wRfx Non Reactive Non Reactive    Comment: Performed at [facility], [address], [place], [place] [address]  Basic metabolic panel     Status: Abnormal   Collection Time: 06/09/22  1:55 AM  Result Value Ref Range   Sodium 139 135 - 145 mmol/L   Potassium 3.9 3.5 - 5.1 mmol/L   Chloride 107 98 - 111 mmol/L   CO2 24 22 - 32 mmol/L   Glucose, Bld 104 (H) 70 - 99 mg/dL    Comment: Glucose reference range applies only to samples taken after fasting for at least 8 hours.   BUN 17 6 - 20 mg/dL   Creatinine, Ser 0.88 0.61 - 1.24 mg/dL   Calcium 9.6 8.9 - 10.3 mg/dL   GFR, Estimated >60 >60 mL/min    Comment: (NOTE) Calculated using the CKD-EPI Creatinine Equation (2021)    Anion gap 8 5 - 15    Comment: Performed at [facility], [address] [place], [place] [address]  CBC     Status: Abnormal   Collection Time: 06/09/22  1:55 AM  Result Value Ref Range   WBC 10.8 (H) 4.0 - 10.5 K/uL   RBC 5.51 4.22 - 5.81 MIL/uL   Hemoglobin 16.0 13.0 - 17.0 g/dL   HCT 49.5 39.0 - 52.0 %   MCV 89.8 80.0 - 100.0 fL   MCH 29.0 26.0 - 34.0 pg   MCHC 32.3 30.0 - 36.0 g/dL   RDW 14.9 11.5 - 15.5 %   Platelets 288 150 - 400 K/uL   nRBC 0.0 0.0 - 0.2 %    Comment: Performed at [facility], [address] [place],  [address]  CBC     Status: None   Collection Time: 06/09/22  8:56 AM  Result Value Ref Range   WBC 9.8 4.0 - 10.5 K/uL   RBC 4.98 4.22 - 5.81 MIL/uL   Hemoglobin 14.4 13.0 - 17.0 g/dL   HCT 45.6 39.0 - 52.0 %   MCV 91.6 80.0 - 100.0 fL   MCH 28.9 26.0 - 34.0 pg   MCHC 31.6 30.0 - 36.0 g/dL   RDW 15.2 11.5 - 15.5 %   Platelets 259 150 - 400 K/uL   nRBC 0.0 0.0 - 0.2 %  Comment: Performed at [facility], [address] [place], [place] [address]  CBC     Status: None   Collection Time: 06/09/22 12:15 PM  Result Value Ref Range   WBC 10.0 4.0 - 10.5 K/uL   RBC 5.05 4.22 - 5.81 MIL/uL   Hemoglobin 14.6 13.0 - 17.0 g/dL    HCT 46.1 39.0 - 52.0 %   MCV 91.3 80.0 - 100.0 fL   MCH 28.9 26.0 - 34.0 pg   MCHC 31.7 30.0 - 36.0 g/dL   RDW 15.2 11.5 - 15.5 %   Platelets 269 150 - 400 K/uL   nRBC 0.0 0.0 - 0.2 %    Comment: Performed at [facility], [place], [place] [address]   CT ANGIO GI BLEED  Result Date: 06/08/2022 CLINICAL DATA:  Bloody stool EXAM: CTA ABDOMEN AND PELVIS WITHOUT AND WITH CONTRAST TECHNIQUE: Multidetector CT imaging of the abdomen and pelvis was performed using the standard protocol during bolus administration of intravenous contrast. Multiplanar reconstructed images and MIPs were obtained and reviewed to evaluate the vascular anatomy. RADIATION DOSE REDUCTION: This exam was performed according to the departmental dose-optimization program which includes automated exposure control, adjustment of the mA and/or kV according to patient size and/or use of iterative reconstruction technique. CONTRAST:  15m OMNIPAQUE IOHEXOL 350 MG/ML SOLN COMPARISON:  CT abdomen and pelvis 07/05/2015 FINDINGS: VASCULAR Aorta: Normal caliber aorta without aneurysm, dissection, vasculitis or significant stenosis. Celiac: Patent without evidence of aneurysm, dissection, vasculitis or significant stenosis. SMA: Patent without evidence of aneurysm, dissection, vasculitis or significant stenosis. Renals: Both renal arteries are patent without evidence of aneurysm, dissection, vasculitis, fibromuscular dysplasia or significant stenosis. Bilateral accessory renal arteries are present. IMA: Patent without evidence of aneurysm, dissection, vasculitis or significant stenosis. Inflow: Patent without evidence of aneurysm, dissection, vasculitis or significant stenosis. Proximal Outflow: Bilateral common femoral and visualized portions of the superficial and profunda femoral arteries are patent without evidence of aneurysm, dissection, vasculitis or significant stenosis. Veins: No obvious venous abnormality within  the limitations of this arterial phase study. Review of the MIP images confirms the above findings. NON-VASCULAR Lower chest: No acute abnormality. Hepatobiliary: No focal liver abnormality is seen. No gallstones, gallbladder wall thickening, or biliary dilatation. Pancreas: Unremarkable. No pancreatic ductal dilatation or surrounding inflammatory changes. Spleen: Normal in size without focal abnormality. Adrenals/Urinary Tract: Adrenal glands are unremarkable. There is no hydronephrosis or perinephric fat stranding. 1 cm cyst is noted in the right kidney. Bladder is unremarkable. Stomach/Bowel: There is no evidence for bowel obstruction, pneumatosis or free air. No active gastrointestinal bleeding identified. There is a sigmoid colon anastomosis. There is descending colon and transverse colon diverticulosis without evidence for diverticulitis. The stomach appears within normal limits. There is a short loop of prominent small bowel in the right mid abdomen demonstrating wall thickening with some adjacent mesenteric lymphadenopathy. This is new from prior examination and best seen on axial image 9/114. Lymphatic: No enlarged lymph nodes are identified in the retroperitoneum or pelvis. Reproductive: Prostate is unremarkable. Other: No abdominal wall hernia or abnormality. No abdominopelvic ascites. Musculoskeletal: No acute or significant osseous findings. IMPRESSION: 1. No evidence for active gastrointestinal bleeding. NON-VASCULAR 1. Short loop of prominent small bowel in the right mid abdomen with wall thickening and adjacent mesenteric lymphadenopathy. Findings are concerning for focal enteritis. Follow-up recommended to exclude underlying etiologies. 2. Colonic diverticulosis without evidence for diverticulitis. Electronically Signed   By: ARonney AstersM.D.   On: 06/08/2022  23:18    Pending Labs Unresulted Labs (From admission, onward)     Start     Ordered   06/10/22 5670  Basic metabolic panel  Daily,    R      06/09/22 1624   06/10/22 0500  CBC  Daily,   R      06/09/22 1624   06/10/22 0500  Magnesium  Daily,   R      06/09/22 1624   06/09/22 0023  Hemoglobin A1c  Once,   R        06/09/22 0022            Vitals/Pain Today's Vitals   06/09/22 0700 06/09/22 1200 06/09/22 1213 06/09/22 1755  BP: 124/69 129/83  (!) 150/96  Pulse: 94 92  89  Resp: '16 16  16  '$ Temp:   98.5 F (36.9 C)   TempSrc:   Oral   SpO2: 94% 94%  97%  Weight:      Height:      PainSc:        Isolation Precautions No active isolations  Medications Medications  ondansetron (ZOFRAN) tablet 4 mg (has no administration in time range)    Or  ondansetron (ZOFRAN) injection 4 mg (has no administration in time range)  iohexol (OMNIPAQUE) 350 MG/ML injection 100 mL (100 mLs Intravenous Contrast Given 06/08/22 2249)    Mobility walks Low fall risk   Focused Assessments    R Recommendations: See Admitting Provider Note  Report given to:   Additional Notes:

## 2022-06-09 NOTE — Plan of Care (Signed)

## 2022-06-09 NOTE — ED Notes (Signed)
Advised nurse that patient has ready bed 

## 2022-06-09 NOTE — ED Notes (Signed)
Patient with two different bloody bowel movements with reported moderate amount of blood with each per patient. MD Billie Ruddy notified.

## 2022-06-09 NOTE — H&P (Signed)
History and Physical    Patient: Neil Mcintyre ACZ:[id number] DOB: Jun 17, 1975 DOA: 06/08/2022 DOS: the patient was seen and examined on 06/09/2022 PCP: Ria Bush, MD  Patient coming from: Home  Chief Complaint:  Chief Complaint  Patient presents with   Rectal Bleeding   HPI: Neil Mcintyre is a 47 y.o. male with medical history significant of diverticulitis status post sigmoid colectomy in 2017, hypertension, anxiety disorder, morbid obesity, asthma and depression who presented to the ER with 2 large episodes of rectal bleed at home.  Patient also had bright red blood per rectum which is observed in the ER.  He is hemodynamically stable.  Hemoglobin is 16.  Patient is worried about previous bleed that led to ICU admission and multiple blood transfusions.  CT abdomen pelvis is suggestive of acute enteritis.  Suspected infectious enteritis could be a cause.  At this point patient will be admitted to the hospital for further evaluation and treatment.  Stool studies have been obtained and currently pending.  Review of Systems: As mentioned in the history of present illness. All other systems reviewed and are negative. Past Medical History:  Diagnosis Date   Anxiety    Diverticulitis of large intestine with perforation with bleeding 2015, 2017   Laparoscopic sigmoid colectomy 2017   Genital herpes    Hypertension    Irritability and anger    due to stress   Past Surgical History:  Procedure Laterality Date   COLONOSCOPY W/ POLYPECTOMY  09/2014   diverticulosis ([place])   CYSTOSCOPY W/ RETROGRADES Bilateral 08/16/2015   [name], MD   CYSTOSCOPY WITH STENT PLACEMENT Bilateral 08/16/2015   [name], MD   LAPAROSCOPIC SIGMOID COLECTOMY N/A 08/16/2015   recurrent diverticulitis; [name], MD   [place]   LASIK   Social History:  reports that he has been smoking cigarettes and e-cigarettes. He started smoking about 27 years ago. He has never used  smokeless tobacco. He reports current alcohol use of about 1.0 standard drink of alcohol per week. He reports that he does not use drugs.  No Known Allergies  Family History  Adopted: Yes  Family history unknown: Yes    Prior to Admission medications   Medication Sig Start Date End Date Taking? Authorizing Provider  albuterol (PROVENTIL) (2.5 MG/3ML) 0.083% nebulizer solution Take 3 mLs (2.5 mg total) by nebulization every 6 (six) hours as needed for wheezing or shortness of breath. Patient not taking: Reported on 02/25/2022 01/28/22   Scot Jun, FNP  albuterol (VENTOLIN HFA) 108 (90 Base) MCG/ACT inhaler Inhale 2 puffs into the lungs every 6 (six) hours as needed for wheezing or shortness of breath. Patient not taking: Reported on 02/25/2022 01/28/22   Scot Jun, FNP  amLODipine (NORVASC) 5 MG tablet Take 1 tablet (5 mg total) by mouth daily. 02/25/22   Ria Bush, MD  budesonide (PULMICORT FLEXHALER) 180 MCG/ACT inhaler Inhale 1 puff into the lungs in the morning and at bedtime. Patient not taking: Reported on 02/25/2022 05/22/21   Ria Bush, MD  buPROPion (WELLBUTRIN XL) 150 MG 24 hr tablet Take 1 tablet (150 mg total) by mouth daily. 02/25/22   Ria Bush, MD  cyclobenzaprine (FLEXERIL) 5 MG tablet Take 1 tablet (5 mg total) by mouth 2 (two) times daily as needed (headache). 02/25/22   Ria Bush, MD  LORazepam (ATIVAN) 0.5 MG tablet TAKE 1 TABLET (0.5 MG TOTAL) BY MOUTH AT BEDTIME AS NEEDED FOR ANXIETY OR SLEEP.  09/27/19   Ria Bush, MD  metoprolol succinate (TOPROL-XL) 25 MG 24 hr tablet Take 1 tablet (25 mg total) by mouth daily. 02/25/22   Ria Bush, MD  Nebulizers (COMPRESSOR/NEBULIZER) MISC 1 Units by Does not apply route every 4 (four) hours as needed. 06/02/21   Naaman Plummer, MD  valACYclovir (VALTREX) 500 MG tablet Take 1 tablet (500 mg total) by mouth daily. 05/24/22   Ria Bush, MD  valsartan-hydrochlorothiazide  (DIOVAN-HCT) 160-25 MG tablet Take 1 tablet by mouth daily. 02/25/22   Ria Bush, MD    Physical Exam: Vitals:   06/08/22 1958 06/08/22 2000 06/08/22 2342  BP:  (!) 164/102 (!) 137/90  Pulse:  (!) 115 (!) 110  Resp:  17 18  Temp:  98.2 F (36.8 C) 98.2 F (36.8 C)  TempSrc:  Oral Oral  SpO2:  96% 91%  Weight: 122.5 kg    Height: '5\' 6"'$  (1.676 m)     Constitutional: Acutely ill looking, anxious, morbidly obese NAD,  Eyes: PERRL, lids and conjunctivae normal ENMT: Mucous membranes are moist. Posterior pharynx clear of any exudate or lesions.Normal dentition.  Neck: normal, supple, no masses, no thyromegaly Respiratory: clear to auscultation bilaterally, no wheezing, no crackles. Normal respiratory effort. No accessory muscle use.  Cardiovascular: Sinus tachycardia, no murmurs / rubs / gallops. No extremity edema. 2+ pedal pulses. No carotid bruits.  Abdomen: no tenderness, no masses palpated. No hepatosplenomegaly. Bowel sounds positive.  Musculoskeletal: Good range of motion, no joint swelling or tenderness, Skin: no rashes, lesions, ulcers. No induration Neurologic: CN 2-12 grossly intact. Sensation intact, DTR normal. Strength 5/5 in all 4.  Psychiatric: Normal judgment and insight. Alert and oriented x 3. Normal mood  Data Reviewed:  Temperature 98.2 blood pressure 137/90, pulse 115, white count 10.7, hemoglobin 16.3, potassium 3.4 and glucose 192.  CT head T abdomen and pelvis shows no evidence for active GI bleeding.  There is a short loop of prominent small bowel in the right mid abdomen with wall thickening and adjacent mesenteric lymphadenopathy.  Findings are concerning for focal enteritis.  No evidence of diverticulitis.  Assessment and Plan:  #1 lower GI bleed: Suspected enteritis.  Infectious enteritis suspected.  Currently stool studies pending.  Patient hemoglobin and vitals are largely stable.  Monitor H&H serially.  IV Protonix.  Admit for observation.  Empiric  antibiotics could be considered with Rocephin and Flagyl.  If symptoms improve and hemoglobin stabilizes patient could be discharged home in 24 hours.  GI consult if bleeding persist or significant drop in hemoglobin  #2 focal enteritis: Suspected on CT.  Continue as per above  #3 essential hypertension: Hold blood pressure medicine in the setting of acute bleed.  Monitor closely.  #4 morbid obesity: Patient will receive dietary counseling.  #5 hypokalemia: Replete potassium  #6 hyperglycemia: Not a known diabetic.  Will check hemoglobin A1c    Advance Care Planning:   Code Status: Prior full code  Consults: None  Family Communication: No family at bedside  Severity of Illness: The appropriate patient status for this patient is OBSERVATION. Observation status is judged to be reasonable and necessary in order to provide the required intensity of service to ensure the patient's safety. The patient's presenting symptoms, physical exam findings, and initial radiographic and laboratory data in the context of their medical condition is felt to place them at decreased risk for further clinical deterioration. Furthermore, it is anticipated that the patient will be medically stable for discharge from the hospital  within 2 midnights of admission.   AuthorBarbette Merino, MD 06/09/2022 12:16 AM  For on call review [email or web address].

## 2022-06-10 ENCOUNTER — Inpatient Hospital Stay: Payer: 59

## 2022-06-10 DIAGNOSIS — K922 Gastrointestinal hemorrhage, unspecified: Secondary | ICD-10-CM

## 2022-06-10 LAB — CBC
HCT: 40.7 % (ref 39.0–52.0)
Hemoglobin: 13 g/dL (ref 13.0–17.0)
MCH: 28.6 pg (ref 26.0–34.0)
MCHC: 31.9 g/dL (ref 30.0–36.0)
MCV: 89.6 fL (ref 80.0–100.0)
Platelets: 252 10*3/uL (ref 150–400)
RBC: 4.54 MIL/uL (ref 4.22–5.81)
RDW: 14.7 % (ref 11.5–15.5)
WBC: 10.9 10*3/uL — ABNORMAL HIGH (ref 4.0–10.5)
nRBC: 0 % (ref 0.0–0.2)

## 2022-06-10 LAB — BASIC METABOLIC PANEL
Anion gap: 7 (ref 5–15)
BUN: 13 mg/dL (ref 6–20)
CO2: 26 mmol/L (ref 22–32)
Calcium: 8.2 mg/dL — ABNORMAL LOW (ref 8.9–10.3)
Chloride: 102 mmol/L (ref 98–111)
Creatinine, Ser: 0.68 mg/dL (ref 0.61–1.24)
GFR, Estimated: >60 mL/min (ref >60)
Glucose, Bld: 111 mg/dL — ABNORMAL HIGH (ref 70–99)
Potassium: 3.3 mmol/L — ABNORMAL LOW (ref 3.5–5.1)
Sodium: 135 mmol/L (ref 135–145)

## 2022-06-10 LAB — MRSA NEXT GEN BY PCR, NASAL: MRSA by PCR Next Gen: NOT DETECTED

## 2022-06-10 LAB — ABO/RH: ABO/RH(D): O POS

## 2022-06-10 LAB — HEMOGLOBIN A1C
Hgb A1c MFr Bld: 6.8 % — ABNORMAL HIGH (ref 4.8–5.6)
Mean Plasma Glucose: 148 mg/dL

## 2022-06-10 LAB — HEMOGLOBIN AND HEMATOCRIT, BLOOD
HCT: 35.5 % — ABNORMAL LOW (ref 39.0–52.0)
HCT: 30.7 % — ABNORMAL LOW (ref 39.0–52.0)
Hemoglobin: 11.6 g/dL — ABNORMAL LOW (ref 13.0–17.0)
Hemoglobin: 10 g/dL — ABNORMAL LOW (ref 13.0–17.0)

## 2022-06-10 LAB — GLUCOSE, CAPILLARY: Glucose-Capillary: 149 mg/dL — ABNORMAL HIGH (ref 70–99)

## 2022-06-10 LAB — MAGNESIUM: Magnesium: 1.8 mg/dL (ref 1.7–2.4)

## 2022-06-10 MED ORDER — CHLORHEXIDINE GLUCONATE CLOTH 2 % EX PADS
6.0000 | MEDICATED_PAD | Freq: Every day | CUTANEOUS | Status: DC
  Administered 2022-06-10 – 2022-06-12 (×2): 6 via TOPICAL

## 2022-06-10 MED ORDER — POTASSIUM CHLORIDE CRYS ER 20 MEQ PO TBCR
40.0000 meq | EXTENDED_RELEASE_TABLET | Freq: Once | ORAL | Status: AC
  Administered 2022-06-10: 40 meq via ORAL
  Filled 2022-06-10: qty 2

## 2022-06-10 MED ORDER — PEG 3350-KCL-NA BICARB-NACL 420 G PO SOLR: 2000.0000 mL | Freq: Once | ORAL | Status: DC | PRN

## 2022-06-10 MED ORDER — LACTATED RINGERS IV BOLUS
1000.0000 mL | Freq: Once | INTRAVENOUS | Status: AC
  Administered 2022-06-10: 1000 mL via INTRAVENOUS

## 2022-06-10 MED ORDER — SODIUM CHLORIDE 0.9 % IV SOLN: INTRAVENOUS | Status: DC

## 2022-06-10 MED ORDER — ORAL CARE MOUTH RINSE: 15.0000 mL | OROMUCOSAL | Status: DC | PRN

## 2022-06-10 MED ORDER — PEG 3350-KCL-NA BICARB-NACL 420 G PO SOLR
4000.0000 mL | Freq: Once | ORAL | Status: AC
  Administered 2022-06-10: 4000 mL via ORAL
  Filled 2022-06-10: qty 4000

## 2022-06-10 MED ORDER — TECHNETIUM TC 99M-LABELED RED BLOOD CELLS IV KIT
20.0000 | PACK | Freq: Once | INTRAVENOUS | Status: AC | PRN
  Administered 2022-06-10: 18.75 via INTRAVENOUS

## 2022-06-10 MED ORDER — SODIUM CHLORIDE 0.9% IV SOLUTION: Freq: Once | INTRAVENOUS | Status: DC

## 2022-06-10 NOTE — Progress Notes (Signed)
  PROGRESS NOTE    Neil Mcintyre  UYQ:[id number] DOB: [birth year] DOA: 06/08/2022 PCP: Ria Bush, MD  135A/[id number]-AA  LOS: 1 day   Brief hospital course:   Assessment & Plan: Neil Mcintyre is a 47 y.o. male with medical history significant of diverticulitis bleed status post sigmoid colectomy in 2017 and prior embolization at splenic flexure, hypertension, anxiety disorder, morbid obesity, asthma and depression who presented to the ER with 2 large episodes of rectal bleed at home.    #1 Lower GI bleed 2/2 Diverticular bleed --continue to have frequent bright red BM's since presentation, Hgb dropped from 16.3 to 11.6 today. --GI and vascular surgery consulted today --Tagged RBC scan today found bleeding at splenic flexure of the colon.  Per Dr. Lucky Cowboy, since pt already had embolization in the same area, repeat embolization is not the best option, so GI will try colonoscopy to locate and stop the bleeding site. Plan: --colonoscopy tomorrow --move to stepdown in case pt has worsening bleeds and decompensates.  Acute blood loss anemia --Hgb dropped from 16.3 to 11.6 today. --2u pRBC reseved --H&H q8h.  Transfuse if Hgb <8  Suspected focal enteritis --as seen on CT, however, no signs of infection and no abdominal pain.   #3 essential hypertension:  Hold blood pressure medicine in the setting of acute bleed.     #4 morbid obesity, BMI 43   #5 hypokalemia:  Monitor and replete PRN   #6 hyperglycemia DM2 --A1c 6.8, this appears to be a new DM dx --encourage life style modification   DVT prophylaxis: SCD/Compression stockings Code Status: Full code  Family Communication: mother updated at bedside today Level of care: Stepdown Dispo:   The patient is from: home Anticipated d/c is to: home Anticipated d/c date is: undetermined   Subjective and Interval History:  Pt continued to have bright red bloody BM throughout the day.  Hgb gradually dropping.    Tagged RBC scan  today found bleeding at splenic flexure of the colon.  2u pRBC reserved.  Transfer order placed for pt to be moved to stepdown.   Objective: Vitals:   06/10/22 0820 06/10/22 1201 06/10/22 1313 06/10/22 1747  BP: (!) 149/107 (!) 162/100 (!) 126/97 (!) 146/100  Pulse: (!) 105 (!) 128 (!) 126 (!) 136  Resp:   16 16  Temp:    97.9 F (36.6 C)  TempSrc:      SpO2: 96% 98% 98% 98%  Weight:      Height:        Intake/Output Summary (Last 24 hours) at 06/10/2022 1952 Last data filed at 06/10/2022 1400 Gross per 24 hour  Intake 660 ml  Output 0 ml  Net 660 ml   Filed Weights   06/08/22 1958  Weight: 122.5 kg    Examination:   Constitutional: NAD, AAOx3 HEENT: conjunctivae and lids normal, EOMI CV: No cyanosis.   RESP: normal respiratory effort, on RA Neuro: II - XII grossly intact.   Psych: Normal mood and affect.  Appropriate judgement and reason   Data Reviewed: I have personally reviewed labs and imaging studies  Time spent: 50 minutes  Enzo Bi, MD [facility] If 7PM-7AM, please contact night-coverage 06/10/2022, 7:52 PM

## 2022-06-10 NOTE — Progress Notes (Signed)
  Transition of Care [facility]) Screening Note   Patient Details  Name: Neil Mcintyre Date of Birth: 1976/04/14   [facility]) CM/SW Contact:    Quin Hoop, LCSW Phone Number: 06/10/2022, 9:05 AM    Transition of Care Department [facility]) has reviewed patient and no TOC needs have been identified at this time. We will continue to monitor patient advancement through interdisciplinary progression rounds. If new patient transition needs arise, please place a TOC consult.

## 2022-06-10 NOTE — Progress Notes (Signed)
       CROSS COVER NOTE  NAME: Neil Mcintyre MRN: [id number] DOB : 1976/04/20 ATTENDING PHYSICIAN: Darlin Priestly, MD    Date of Service   06/10/2022   HPI/Events of Note   Notified of tachycardia -->HR 140 ST on monitor. Patient is asymptomatic and hemodynamically stable. He continues to have bloody stool.  4098: HGB 9.2 this AM. This is a 4pt drop in one day and 7 pt drop in 2 days. Mr [place] in tachycardic with HR in 130s. I will transfuse 1U PRBC.  Interventions   Assessment/Plan: 1L LR bolus Transfuse 1U PRBC      To reach the provider On-Call:   7AM- 7PM see care teams to locate the attending and reach out to them via [email or web address]. 7PM-7AM contact night-coverage If you still have difficulty reaching the appropriate provider, please page the [facility] (Director on Call) for [facility] on amion for assistance  This document was prepared using Sales executive software and may include unintentional dictation errors.  Bishop Limbo DNP, MBA, FNP-BC Nurse Practitioner [facility] Pager [phone]

## 2022-06-10 NOTE — Consult Note (Signed)
GI Inpatient Consult Note  Reason for Consult: rectal bleeding   Attending Requesting Consult: Dr. Jonelle Sidle  History of Present Illness: Neil Mcintyre is a 47 y.o. male seen for evaluation of Dr. Jonelle Sidle at the request of rectal bleeding. PMhx of HTN, anxiety, morbid obesity, asthma, depression.  Pt has hx of GI bleeding - Hx of 2016 hospitalization for GI Bleed w/  positive bleeding scan and s/p embolization of splenic branch of IMA, required 3 units PRBCs. Had lap sigmoid colectomy 2017 w/ dense inflammatory adhesions from recurrent diverticulitis.   Reports 2 nights ago he was at home and feeling well, had dinner and shortly after developed rectal bleeding - BRB with maroon tinge. No dark/black stools. No abd pain with the rectal bleeding. Reports he presented to ED and had 5 further episodes of blood and did have bloody clot mixed in. He denies any other symptoms-no abd pain, nausea, vomiting. On full liquid diet. Has continued to have some bleeding episodes today and does not feel it is slowing down.   He has chronic intermittent GERD and takes Prilosec 20 mg PRN if eating tomato sauce, etc. Denies any nausea/vomiting, epigastric pain, dysphagia. PRN PPI therapy controls reflux well.   He denies frequent alcohol use. Vapes. Excedrin PRN but this is infrequently. Denies heavy nsaid use.    Last Colonoscopy: 2016--diverticulosis proximal sigmoid, mid sigmoid, descending.    Past Medical History:  Past Medical History:  Diagnosis Date   Anxiety    Diverticulitis of large intestine with perforation with bleeding 2015, 2017   Laparoscopic sigmoid colectomy 2017   Genital herpes    Hypertension    Irritability and anger    due to stress    Problem List: Patient Active Problem List   Diagnosis Date Noted   Hypokalemia 06/09/2022   Enteritis of infectious origin 06/09/2022   GI bleed 06/09/2022   Lower GI bleed 06/09/2022   Left-sided headache 02/25/2022   COVID-19 05/10/2021    Seasonal allergic rhinitis 09/07/2019   Tonsillar enlargement 09/07/2019   Genital herpes    Weak urinary stream 09/04/2017   Mood disorder ([place]) 02/21/2017   Health maintenance examination 10/30/2016   Prediabetes 10/30/2016   Dyslipidemia 10/30/2016   Obesity, morbid, BMI 40.0-49.9 ([place]) 07/29/2016   Diverticulitis of large intestine with perforation with bleeding    Anxiety 02/21/2015   Primary hypertension 02/21/2015    Past Surgical History: Past Surgical History:  Procedure Laterality Date   COLONOSCOPY W/ POLYPECTOMY  09/2014   diverticulosis ([place])   CYSTOSCOPY W/ RETROGRADES Bilateral 08/16/2015   [name], MD   CYSTOSCOPY WITH STENT PLACEMENT Bilateral 08/16/2015   [name], MD   LAPAROSCOPIC SIGMOID COLECTOMY N/A 08/16/2015   recurrent diverticulitis; [name], MD   REFRACTIVE SURGERY  1999   LASIK    Allergies: No Known Allergies  Home Medications: Medications Prior to Admission  Medication Sig Dispense Refill Last Dose   albuterol (PROVENTIL) (2.5 MG/3ML) 0.083% nebulizer solution Take 3 mLs (2.5 mg total) by nebulization every 6 (six) hours as needed for wheezing or shortness of breath. (Patient not taking: Reported on 02/25/2022) 75 mL 0    albuterol (VENTOLIN HFA) 108 (90 Base) MCG/ACT inhaler Inhale 2 puffs into the lungs every 6 (six) hours as needed for wheezing or shortness of breath. (Patient not taking: Reported on 02/25/2022) 1 each 0    amLODipine (NORVASC) 5 MG tablet Take 1 tablet (5 mg total) by mouth daily. 90 tablet 1  budesonide (PULMICORT FLEXHALER) 180 MCG/ACT inhaler Inhale 1 puff into the lungs in the morning and at bedtime. (Patient not taking: Reported on 02/25/2022) 1 each 0    buPROPion (WELLBUTRIN XL) 150 MG 24 hr tablet Take 1 tablet (150 mg total) by mouth daily. 90 tablet 1    cyclobenzaprine (FLEXERIL) 5 MG tablet Take 1 tablet (5 mg total) by mouth 2 (two) times daily as needed (headache). 30 tablet 0     LORazepam (ATIVAN) 0.5 MG tablet TAKE 1 TABLET (0.5 MG TOTAL) BY MOUTH AT BEDTIME AS NEEDED FOR ANXIETY OR SLEEP. 30 tablet 0    metoprolol succinate (TOPROL-XL) 25 MG 24 hr tablet Take 1 tablet (25 mg total) by mouth daily. 90 tablet 1    Nebulizers (COMPRESSOR/NEBULIZER) MISC 1 Units by Does not apply route every 4 (four) hours as needed. 1 each 0    valACYclovir (VALTREX) 500 MG tablet Take 1 tablet (500 mg total) by mouth daily. 90 tablet 1    valsartan-hydrochlorothiazide (DIOVAN-HCT) 160-25 MG tablet Take 1 tablet by mouth daily. 90 tablet 1    Home medication reconciliation was completed with the patient.   Scheduled Inpatient Medications:    Continuous Inpatient Infusions:    PRN Inpatient Medications:  ondansetron **OR** ondansetron (ZOFRAN) IV  Family History: He was adopted. Family history is unknown by patient.    Social History:   reports that he has been smoking cigarettes and e-cigarettes. He started smoking about 27 years ago. He has never used smokeless tobacco. He reports current alcohol use of about 1.0 standard drink of alcohol per week. He reports that he does not use drugs.   Review of Systems: Constitutional: Weight is stable.  Eyes: No changes in vision. ENT: No oral lesions, sore throat.  GI: see HPI.  Heme/Lymph: No easy bruising.  CV: No chest pain.  GU: No hematuria.  Integumentary: No rashes.  Neuro: No headaches.  Psych: No depression/anxiety.  Endocrine: No heat/cold intolerance.  Allergic/Immunologic: No urticaria.  Resp: No cough, SOB.  Musculoskeletal: No joint swelling.    Physical Examination: BP (!) 162/100   Pulse (!) 128   Temp 97.7 F (36.5 C)   Resp 16   Ht '5\' 6"'$  (1.676 m)   Wt 122.5 kg   SpO2 98%   BMI 43.58 kg/m  Gen: NAD, alert and oriented x 4 HEENT: PEERLA, EOMI, Neck: supple, no JVD or thyromegaly Chest: CTA bilaterally, no wheezes, crackles, or other adventitious sounds CV: RRR, no m/g/c/r Abd: soft, NT, ND, +BS in  all four quadrants; no HSM, guarding, ridigity, or rebound tenderness Ext: no edema, well perfused with 2+ pulses, Skin: no rash or lesions noted Lymph: no LAD  Data: Lab Results  Component Value Date   WBC 10.9 (H) 06/10/2022   HGB 13.0 06/10/2022   HCT 40.7 06/10/2022   MCV 89.6 06/10/2022   PLT 252 06/10/2022   Recent Labs  Lab 06/09/22 0856 06/09/22 1215 06/10/22 0256  HGB 14.4 14.6 13.0   Lab Results  Component Value Date   NA 135 06/10/2022   K 3.3 (L) 06/10/2022   CL 102 06/10/2022   CO2 26 06/10/2022   BUN 13 06/10/2022   CREATININE 0.68 06/10/2022   Lab Results  Component Value Date   ALT 24 06/08/2022   AST 26 06/08/2022   ALKPHOS 84 06/08/2022   BILITOT 0.7 06/08/2022   No results for input(s): "APTT", "INR", "PTT" in the last 168 hours.  CT angio 06/08/22- IMPRESSION:  1. No evidence for active gastrointestinal bleeding. NON-VASCULAR 1. Short loop of prominent small bowel in the right mid abdomen with wall thickening and adjacent mesenteric lymphadenopathy. Findings are concerning for focal enteritis. Follow-up recommended to exclude underlying etiologies. 2. Colonic diverticulosis without evidence for diverticulitis.  Assessment/Plan: Mr. Klingberg is a 47 y.o. male admitted for rectal bleeding.   Rectal bleeding- hx of 2016 hospitalization w/ embolization of splenic branch of IMA (transfusion dependent that episode), and lap sigmoid colectomy 2017 for recurrent diverticulitis. Has had several episodes of rectal bleeding over past 48 hrs with drop in H/h. Suspect diverticular bleed but will plan for colonoscopy tomorrow for evaluation. Has not had colonoscopy since surgery 2017. Further plan dependent on colonoscopy findings. Anemia - Hgb 16 on admission, has dropped down to 11.6. continue monitoring. Hemodynamically stable.  HTN Obesity-BMI 44.  Asthma    Recommendations: Colonoscopy tomorrow Prep this evening  Continue supportive care  Clear  liquids, bowel prep then NPO prior to procedure   I reviewed the risks (including bleeding, perforation, infection, anesthesia complications, cardiac/respiratory complications), benefits and alternatives of colonoscopy. Patient consents to proceed. Denies prior issues w/ sedation.   Case was discussed with Dr. [name]. Thank you for the consult. Please call with questions or concerns.  Ezzard Standing, PA-C [facility]

## 2022-06-10 NOTE — Care Plan (Signed)
GI Update Note  Positive tagged rbc scan with bleed in splenic flexure Discussed case with vascular surgery, embolization may be an option but higher risk for ischemic injury given previous embolization and partial colectomy Plan for colonoscopy to evaluate tomorrow after patient prep If unable to find or control source during endoscopy, will d/w vascular surgery vs general surgery for intervention.  Ronne Binning, [facility]

## 2022-06-11 ENCOUNTER — Inpatient Hospital Stay: Payer: 59 | Admitting: Anesthesiology

## 2022-06-11 ENCOUNTER — Encounter
Admission: EM | Disposition: A | Discharge status: Home / Self Care | Payer: Self-pay | Source: Home / Self Care | Attending: Hospitalist

## 2022-06-11 ENCOUNTER — Encounter: Payer: Self-pay | Admitting: Hospitalist

## 2022-06-11 DIAGNOSIS — K922 Gastrointestinal hemorrhage, unspecified: Secondary | ICD-10-CM | POA: Diagnosis not present

## 2022-06-11 HISTORY — PX: COLONOSCOPY WITH PROPOFOL: SHX5780

## 2022-06-11 LAB — PREPARE RBC (CROSSMATCH)

## 2022-06-11 LAB — BASIC METABOLIC PANEL
Anion gap: 7 (ref 5–15)
BUN: 17 mg/dL (ref 6–20)
CO2: 24 mmol/L (ref 22–32)
Calcium: 8.1 mg/dL — ABNORMAL LOW (ref 8.9–10.3)
Chloride: 105 mmol/L (ref 98–111)
Creatinine, Ser: 0.85 mg/dL (ref 0.61–1.24)
GFR, Estimated: >60 mL/min (ref >60)
Glucose, Bld: 145 mg/dL — ABNORMAL HIGH (ref 70–99)
Potassium: 4.1 mmol/L (ref 3.5–5.1)
Sodium: 136 mmol/L (ref 135–145)

## 2022-06-11 LAB — HEMOGLOBIN AND HEMATOCRIT, BLOOD
HCT: 29.1 % — ABNORMAL LOW (ref 39.0–52.0)
Hemoglobin: 9.5 g/dL — ABNORMAL LOW (ref 13.0–17.0)

## 2022-06-11 LAB — CBC
HCT: 27.9 % — ABNORMAL LOW (ref 39.0–52.0)
Hemoglobin: 9.2 g/dL — ABNORMAL LOW (ref 13.0–17.0)
MCH: 29.4 pg (ref 26.0–34.0)
MCHC: 33 g/dL (ref 30.0–36.0)
MCV: 89.1 fL (ref 80.0–100.0)
Platelets: 266 10*3/uL (ref 150–400)
RBC: 3.13 MIL/uL — ABNORMAL LOW (ref 4.22–5.81)
RDW: 14.8 % (ref 11.5–15.5)
WBC: 21.1 10*3/uL — ABNORMAL HIGH (ref 4.0–10.5)
nRBC: 0.4 % — ABNORMAL HIGH (ref 0.0–0.2)

## 2022-06-11 LAB — MAGNESIUM: Magnesium: 1.9 mg/dL (ref 1.7–2.4)

## 2022-06-11 SURGERY — COLONOSCOPY WITH PROPOFOL
Anesthesia: General

## 2022-06-11 MED ORDER — PROPOFOL 10 MG/ML IV BOLUS
INTRAVENOUS | Status: AC
  Filled 2022-06-11: qty 40

## 2022-06-11 MED ORDER — LIDOCAINE HCL (CARDIAC) PF 100 MG/5ML IV SOSY
PREFILLED_SYRINGE | INTRAVENOUS | Status: DC | PRN
  Administered 2022-06-11: 50 mg via INTRAVENOUS

## 2022-06-11 MED ORDER — SODIUM CHLORIDE 0.9% IV SOLUTION: Freq: Once | INTRAVENOUS | Status: AC

## 2022-06-11 MED ORDER — DEXMEDETOMIDINE HCL IN NACL 200 MCG/50ML IV SOLN
INTRAVENOUS | Status: DC | PRN
  Administered 2022-06-11: 4 ug via INTRAVENOUS

## 2022-06-11 MED ORDER — PROPOFOL 10 MG/ML IV BOLUS
INTRAVENOUS | Status: DC | PRN
  Administered 2022-06-11: 100 mg via INTRAVENOUS

## 2022-06-11 MED ORDER — PROPOFOL 500 MG/50ML IV EMUL
INTRAVENOUS | Status: DC | PRN
  Administered 2022-06-11: 150 ug/kg/min via INTRAVENOUS

## 2022-06-11 MED ORDER — HYDRALAZINE HCL 20 MG/ML IJ SOLN
5.0000 mg | Freq: Four times a day (QID) | INTRAMUSCULAR | Status: DC | PRN
  Administered 2022-06-11: 5 mg via INTRAVENOUS
  Filled 2022-06-11: qty 1

## 2022-06-11 NOTE — Progress Notes (Signed)
Inpatient Follow-up/Progress Note   Patient ID: Neil Mcintyre is a 47 y.o. male.  Overnight Events / Subjective Findings Patient continues to have bloody bowel movements.  He is actively completing his prep.  Hemoglobin has distended to 9.2 and is now tachycardic, but remains normo to hypertensive. Plan for colonoscopy today.  N.p.o. aside from GoLytely prep since midnight. Active bleeding noted on tagged RBC scan at splenic flexure Scheduled to receive 1 unit PRBC today from primary team New leukocytosis of 21,000, but patient asymptomatic from infectious perspective  Review of Systems  Constitutional:  Negative for activity change, appetite change, chills, diaphoresis, fatigue, fever and unexpected weight change.  HENT:  Negative for trouble swallowing and voice change.   Respiratory:  Negative for shortness of breath and wheezing.   Cardiovascular:  Negative for chest pain, palpitations and leg swelling.  Gastrointestinal:  Positive for blood in stool. Negative for abdominal distention, abdominal pain, anal bleeding, constipation, diarrhea, nausea and vomiting.  Musculoskeletal:  Negative for arthralgias and myalgias.  Skin:  Negative for color change and pallor.  Neurological:  Negative for dizziness, syncope and weakness.  Psychiatric/Behavioral:  Negative for confusion. The patient is not nervous/anxious.   All other systems reviewed and are negative.    Medications  Current Facility-Administered Medications:    0.9 %  sodium chloride infusion (Manually program via Guardrails IV Fluids), , Intravenous, Once, Enzo Bi, MD, Held at 06/10/22 2040   0.9 %  sodium chloride infusion, , Intravenous, Continuous, Annamaria Helling, DO, Held at 06/10/22 2040   Chlorhexidine Gluconate Cloth 2 % PADS 6 each, 6 each, Topical, Daily, Enzo Bi, MD, 6 each at 06/10/22 2001   ondansetron (ZOFRAN) tablet 4 mg, 4 mg, Oral, Q6H PRN **OR** ondansetron (ZOFRAN) injection 4 mg, 4 mg,  Intravenous, Q6H PRN, Elwyn Reach, MD   Oral care mouth rinse, 15 mL, Mouth Rinse, PRN, Enzo Bi, MD   polyethylene glycol-electrolytes (NuLYTELY) solution 2,000 mL, 2,000 mL, Oral, Once PRN, Annamaria Helling, DO  sodium chloride Stopped (06/10/22 2040)    ondansetron **OR** ondansetron (ZOFRAN) IV, mouth rinse, polyethylene glycol-electrolytes   Objective    Vitals:   06/11/22 0400 06/11/22 0500 06/11/22 0600 06/11/22 0900  BP: (!) 157/88 (!) 137/98 (!) 152/108 (!) 148/108  Pulse: (!) 131 (!) 124 (!) 137 (!) 135  Resp:    11  Temp:    (!) 97.5 F (36.4 C)  TempSrc:    Oral  SpO2: 98% 97% 97% 100%  Weight:      Height:         Physical Exam Vitals and nursing note reviewed.  Constitutional:      General: He is not in acute distress.    Appearance: Normal appearance. He is obese. He is not ill-appearing, toxic-appearing or diaphoretic.  HENT:     Head: Normocephalic and atraumatic.     Nose: Nose normal.     Mouth/Throat:     Mouth: Mucous membranes are moist.     Pharynx: Oropharynx is clear.  Eyes:     General: No scleral icterus.    Extraocular Movements: Extraocular movements intact.  Cardiovascular:     Rate and Rhythm: Regular rhythm. Tachycardia present.     Heart sounds: Normal heart sounds. No murmur heard.    No friction rub. No gallop.  Pulmonary:     Effort: Pulmonary effort is normal. No respiratory distress.     Breath sounds: Normal breath sounds. No wheezing, rhonchi or  rales.  Abdominal:     General: Bowel sounds are normal. There is no distension.     Palpations: Abdomen is soft.     Tenderness: There is no abdominal tenderness. There is no guarding or rebound.  Musculoskeletal:     Cervical back: Neck supple.     Right lower leg: No edema.     Left lower leg: No edema.  Skin:    General: Skin is warm and dry.     Coloration: Skin is not jaundiced or pale.  Neurological:     General: No focal deficit present.     Mental Status: He  is alert and oriented to person, place, and time. Mental status is at baseline.  Psychiatric:        Mood and Affect: Mood normal.        Behavior: Behavior normal.        Thought Content: Thought content normal.        Judgment: Judgment normal.      Laboratory Data Recent Labs  Lab 06/09/22 1215 06/10/22 0256 06/10/22 1328 06/10/22 2121 06/11/22 0544  WBC 10.0 10.9*  --   --  21.1*  HGB 14.6 13.0 11.6* 10.0* 9.2*  HCT 46.1 40.7 35.5* 30.7* 27.9*  PLT 269 252  --   --  266   Recent Labs  Lab 06/08/22 2001 06/09/22 0155 06/10/22 0256 06/11/22 0544  NA 137 139 135 136  K 3.4* 3.9 3.3* 4.1  CL 103 107 102 105  CO2 '24 24 26 24  '$ BUN '15 17 13 17  '$ CREATININE 0.87 0.88 0.68 0.85  CALCIUM 9.3 9.6 8.2* 8.1*  PROT 7.0  --   --   --   BILITOT 0.7  --   --   --   ALKPHOS 84  --   --   --   ALT 24  --   --   --   AST 26  --   --   --   GLUCOSE 192* 104* 111* 145*   No results for input(s): "INR" in the last 168 hours.    Imaging Studies: NM GI Blood Loss  Addendum Date: 06/10/2022   ADDENDUM REPORT: 06/10/2022 17:38 ADDENDUM: Critical Value/emergent results were called by telephone at the time of interpretation on 06/10/2022 at 5:37 pm to provider TINA LAI , who verbally acknowledged these results. Electronically Signed   By: Yetta Glassman M.D.   On: 06/10/2022 17:38   Result Date: 06/10/2022 CLINICAL DATA:  Rectal bleeding EXAM: NUCLEAR MEDICINE GASTROINTESTINAL BLEEDING SCAN TECHNIQUE: Sequential abdominal images were obtained following intravenous administration of Tc-85mlabeled red blood cells. RADIOPHARMACEUTICALS:  18.8 mCi Tc-940mertechnetate in-vitro labeled red cells. COMPARISON:  None Available. FINDINGS: Tracer activity which appears at the approximate area of the splenic flexure of the colon, increases with more delayed imaging, and moves antegrade to the expected area of the sigmoid colon. IMPRESSION: Abnormal tracer activity consistent with active gastrointestinal  bleed, likely source at the area of the splenic flexure of the colon. Critical Value/emergent results will be called by telephone and addendum made to this report document communication. Electronically Signed: By: LeYetta Glassman.D. On: 06/10/2022 17:30    Assessment:  # LGIB- splenic flexure - positive tagged rbc scan - h/o diverticular bleed (2017) s/p embolization and partial colectomy (sigmoid)  # ABLA  - hgb down from 16 (06/09/21) to 9.2 today - tachycardic, but BP remained normo to hypertensive -Patient received 1 unit PRBC 06/11/2022  #  Obesity  # Diverticulosis with h/o bleed  # leukocytosis - 21K; afebrile  Plan:  Colonoscopy planned for today pending patient stability and endoscopy suite availability Completing go lytely prep NPO since midnight Morning labs reviewed.  Protonix 40 mg iv q12 h Hold dvt ppx Monitor H&H.  Transfusion and resuscitation as per primary team Avoid frequent lab draws to prevent lab induced anemia Supportive care and antiemetics as per primary team Maintain two sites IV access Avoid nsaids Monitor for GIB.  Case discussed with vascular surgery team.  If unable to find or control bleeding on colonoscopy neck step would be potential embolization versus possible surgical intervention.  Given previous embolization and surgery concern remains for further ischemia  Further recommendations pending endoscopy.  Please see op report for further details  Colonoscopy with possible biopsy, control of bleeding, polypectomy, and interventions as necessary has been discussed with the patient/patient representative. Informed consent was obtained from the patient/patient representative after explaining the indication, nature, and risks of the procedure including but not limited to death, bleeding, perforation, missed neoplasm/lesions, cardiorespiratory compromise, and reaction to medications. Opportunity for questions was given and appropriate answers were  provided. Patient/patient representative has verbalized understanding is amenable to undergoing the procedure.   I personally performed the service.  Management of other medical comorbidities as per primary team  Thank you for allowing [place] to participate in this patient's care. Please don't hesitate to call if any questions or concerns arise.   Annamaria Helling, DO [facility] Gastroenterology  Portions of the record may have been created with voice recognition software. Occasional wrong-word or 'sound-a-like' substitutions may have occurred due to the inherent limitations of voice recognition software.  Read the chart carefully and recognize, using context, where substitutions may have occurred.

## 2022-06-11 NOTE — H&P (View-Only) (Signed)
Inpatient Follow-up/Progress Note   Patient ID: Neil Mcintyre is a 47 y.o. male.  Overnight Events / Subjective Findings Patient continues to have bloody bowel movements.  He is actively completing his prep.  Hemoglobin has distended to 9.2 and is now tachycardic, but remains normo to hypertensive. Plan for colonoscopy today.  N.p.o. aside from GoLytely prep since midnight. Active bleeding noted on tagged RBC scan at splenic flexure Scheduled to receive 1 unit PRBC today from primary team New leukocytosis of 21,000, but patient asymptomatic from infectious perspective  Review of Systems  Constitutional:  Negative for activity change, appetite change, chills, diaphoresis, fatigue, fever and unexpected weight change.  HENT:  Negative for trouble swallowing and voice change.   Respiratory:  Negative for shortness of breath and wheezing.   Cardiovascular:  Negative for chest pain, palpitations and leg swelling.  Gastrointestinal:  Positive for blood in stool. Negative for abdominal distention, abdominal pain, anal bleeding, constipation, diarrhea, nausea and vomiting.  Musculoskeletal:  Negative for arthralgias and myalgias.  Skin:  Negative for color change and pallor.  Neurological:  Negative for dizziness, syncope and weakness.  Psychiatric/Behavioral:  Negative for confusion. The patient is not nervous/anxious.   All other systems reviewed and are negative.    Medications  Current Facility-Administered Medications:    0.9 %  sodium chloride infusion (Manually program via Guardrails IV Fluids), , Intravenous, Once, Enzo Bi, MD, Held at 06/10/22 2040   0.9 %  sodium chloride infusion, , Intravenous, Continuous, Annamaria Helling, DO, Held at 06/10/22 2040   Chlorhexidine Gluconate Cloth 2 % PADS 6 each, 6 each, Topical, Daily, Enzo Bi, MD, 6 each at 06/10/22 2001   ondansetron (ZOFRAN) tablet 4 mg, 4 mg, Oral, Q6H PRN **OR** ondansetron (ZOFRAN) injection 4 mg, 4 mg,  Intravenous, Q6H PRN, Elwyn Reach, MD   Oral care mouth rinse, 15 mL, Mouth Rinse, PRN, Enzo Bi, MD   polyethylene glycol-electrolytes (NuLYTELY) solution 2,000 mL, 2,000 mL, Oral, Once PRN, Annamaria Helling, DO  sodium chloride Stopped (06/10/22 2040)    ondansetron **OR** ondansetron (ZOFRAN) IV, mouth rinse, polyethylene glycol-electrolytes   Objective    Vitals:   06/11/22 0400 06/11/22 0500 06/11/22 0600 06/11/22 0900  BP: (!) 157/88 (!) 137/98 (!) 152/108 (!) 148/108  Pulse: (!) 131 (!) 124 (!) 137 (!) 135  Resp:    11  Temp:    (!) 97.5 F (36.4 C)  TempSrc:    Oral  SpO2: 98% 97% 97% 100%  Weight:      Height:         Physical Exam Vitals and nursing note reviewed.  Constitutional:      General: He is not in acute distress.    Appearance: Normal appearance. He is obese. He is not ill-appearing, toxic-appearing or diaphoretic.  HENT:     Head: Normocephalic and atraumatic.     Nose: Nose normal.     Mouth/Throat:     Mouth: Mucous membranes are moist.     Pharynx: Oropharynx is clear.  Eyes:     General: No scleral icterus.    Extraocular Movements: Extraocular movements intact.  Cardiovascular:     Rate and Rhythm: Regular rhythm. Tachycardia present.     Heart sounds: Normal heart sounds. No murmur heard.    No friction rub. No gallop.  Pulmonary:     Effort: Pulmonary effort is normal. No respiratory distress.     Breath sounds: Normal breath sounds. No wheezing, rhonchi or  rales.  Abdominal:     General: Bowel sounds are normal. There is no distension.     Palpations: Abdomen is soft.     Tenderness: There is no abdominal tenderness. There is no guarding or rebound.  Musculoskeletal:     Cervical back: Neck supple.     Right lower leg: No edema.     Left lower leg: No edema.  Skin:    General: Skin is warm and dry.     Coloration: Skin is not jaundiced or pale.  Neurological:     General: No focal deficit present.     Mental Status: He  is alert and oriented to person, place, and time. Mental status is at baseline.  Psychiatric:        Mood and Affect: Mood normal.        Behavior: Behavior normal.        Thought Content: Thought content normal.        Judgment: Judgment normal.      Laboratory Data Recent Labs  Lab 06/09/22 1215 06/10/22 0256 06/10/22 1328 06/10/22 2121 06/11/22 0544  WBC 10.0 10.9*  --   --  21.1*  HGB 14.6 13.0 11.6* 10.0* 9.2*  HCT 46.1 40.7 35.5* 30.7* 27.9*  PLT 269 252  --   --  266   Recent Labs  Lab 06/08/22 2001 06/09/22 0155 06/10/22 0256 06/11/22 0544  NA 137 139 135 136  K 3.4* 3.9 3.3* 4.1  CL 103 107 102 105  CO2 '24 24 26 24  '$ BUN '15 17 13 17  '$ CREATININE 0.87 0.88 0.68 0.85  CALCIUM 9.3 9.6 8.2* 8.1*  PROT 7.0  --   --   --   BILITOT 0.7  --   --   --   ALKPHOS 84  --   --   --   ALT 24  --   --   --   AST 26  --   --   --   GLUCOSE 192* 104* 111* 145*   No results for input(s): "INR" in the last 168 hours.    Imaging Studies: NM GI Blood Loss  Addendum Date: 06/10/2022   ADDENDUM REPORT: 06/10/2022 17:38 ADDENDUM: Critical Value/emergent results were called by telephone at the time of interpretation on 06/10/2022 at 5:37 pm to provider TINA LAI , who verbally acknowledged these results. Electronically Signed   By: Yetta Glassman M.D.   On: 06/10/2022 17:38   Result Date: 06/10/2022 CLINICAL DATA:  Rectal bleeding EXAM: NUCLEAR MEDICINE GASTROINTESTINAL BLEEDING SCAN TECHNIQUE: Sequential abdominal images were obtained following intravenous administration of Tc-22mlabeled red blood cells. RADIOPHARMACEUTICALS:  18.8 mCi Tc-983mertechnetate in-vitro labeled red cells. COMPARISON:  None Available. FINDINGS: Tracer activity which appears at the approximate area of the splenic flexure of the colon, increases with more delayed imaging, and moves antegrade to the expected area of the sigmoid colon. IMPRESSION: Abnormal tracer activity consistent with active gastrointestinal  bleed, likely source at the area of the splenic flexure of the colon. Critical Value/emergent results will be called by telephone and addendum made to this report document communication. Electronically Signed: By: LeYetta Glassman.D. On: 06/10/2022 17:30    Assessment:  # LGIB- splenic flexure - positive tagged rbc scan - h/o diverticular bleed (2017) s/p embolization and partial colectomy (sigmoid)  # ABLA  - hgb down from 16 (06/09/21) to 9.2 today - tachycardic, but BP remained normo to hypertensive -Patient received 1 unit PRBC 06/11/2022  #  Obesity  # Diverticulosis with h/o bleed  # leukocytosis - 21K; afebrile  Plan:  Colonoscopy planned for today pending patient stability and endoscopy suite availability Completing go lytely prep NPO since midnight Morning labs reviewed.  Protonix 40 mg iv q12 h Hold dvt ppx Monitor H&H.  Transfusion and resuscitation as per primary team Avoid frequent lab draws to prevent lab induced anemia Supportive care and antiemetics as per primary team Maintain two sites IV access Avoid nsaids Monitor for GIB.  Case discussed with vascular surgery team.  If unable to find or control bleeding on colonoscopy neck step would be potential embolization versus possible surgical intervention.  Given previous embolization and surgery concern remains for further ischemia  Further recommendations pending endoscopy.  Please see op report for further details  Colonoscopy with possible biopsy, control of bleeding, polypectomy, and interventions as necessary has been discussed with the patient/patient representative. Informed consent was obtained from the patient/patient representative after explaining the indication, nature, and risks of the procedure including but not limited to death, bleeding, perforation, missed neoplasm/lesions, cardiorespiratory compromise, and reaction to medications. Opportunity for questions was given and appropriate answers were  provided. Patient/patient representative has verbalized understanding is amenable to undergoing the procedure.   I personally performed the service.  Management of other medical comorbidities as per primary team  Thank you for allowing [place] to participate in this patient's care. Please don't hesitate to call if any questions or concerns arise.   Annamaria Helling, DO [facility]  Portions of the record may have been created with voice recognition software. Occasional wrong-word or 'sound-a-like' substitutions may have occurred due to the inherent limitations of voice recognition software.  Read the chart carefully and recognize, using context, where substitutions may have occurred.

## 2022-06-11 NOTE — Anesthesia Preprocedure Evaluation (Signed)
Anesthesia Evaluation  Patient identified by MRN, date of birth, ID band Patient awake    Reviewed: Allergy & Precautions, NPO status , Patient's Chart, lab work & pertinent test results  History of Anesthesia Complications Negative for: history of anesthetic complications  Airway Mallampati: III  TM Distance: >3 FB Neck ROM: full    Dental  (+) Chipped   Pulmonary neg shortness of breath, Current Smoker and Patient abstained from smoking.   Pulmonary exam normal        Cardiovascular Exercise Tolerance: Good hypertension, (-) angina (-) Past MI Normal cardiovascular exam     Neuro/Psych  Headaches  negative psych ROS   GI/Hepatic negative GI ROS, Neg liver ROS,neg GERD  ,,  Endo/Other  negative endocrine ROS    Renal/GU negative Renal ROS  negative genitourinary   Musculoskeletal   Abdominal   Peds  Hematology negative hematology ROS (+)   Anesthesia Other Findings Past Medical History: No date: Anxiety 2015, 2017: Diverticulitis of large intestine with perforation with  bleeding     Comment:  Laparoscopic sigmoid colectomy 2017 No date: Genital herpes No date: Hypertension No date: Irritability and anger     Comment:  due to stress  Past Surgical History: 09/2014: COLONOSCOPY W/ POLYPECTOMY     Comment:  diverticulosis ([place]) 08/16/2015: CYSTOSCOPY W/ RETROGRADES; Bilateral     Comment:  [name], MD 08/16/2015: Consuela Mimes WITH STENT PLACEMENT; Bilateral     Comment:  [name], MD 08/16/2015: LAPAROSCOPIC SIGMOID COLECTOMY; N/A     Comment:  recurrent diverticulitis; [name], MD 1999: REFRACTIVE SURGERY     Comment:  LASIK  BMI    Body Mass Index: 38.93 kg/m      Reproductive/Obstetrics negative OB ROS                             Anesthesia Physical Anesthesia Plan  ASA: 3  Anesthesia Plan: General   Post-op Pain Management:    Induction:  Intravenous  PONV Risk Score and Plan: Propofol infusion and TIVA  Airway Management Planned: Natural Airway and Nasal Cannula  Additional Equipment:   Intra-op Plan:   Post-operative Plan:   Informed Consent: I have reviewed the patients History and Physical, chart, labs and discussed the procedure including the risks, benefits and alternatives for the proposed anesthesia with the patient or authorized representative who has indicated his/her understanding and acceptance.     Dental Advisory Given  Plan Discussed with: Anesthesiologist, CRNA and Surgeon  Anesthesia Plan Comments: (Patient consented for risks of anesthesia including but not limited to:  - adverse reactions to medications - risk of airway placement if required - damage to eyes, teeth, lips or other oral mucosa - nerve damage due to positioning  - sore throat or hoarseness - Damage to heart, brain, nerves, lungs, other parts of body or loss of life  Patient voiced understanding.)       Anesthesia Quick Evaluation

## 2022-06-11 NOTE — Progress Notes (Signed)
  PROGRESS NOTE    Neil Mcintyre  RCV:[id number] DOB: 03-Sep-1975 DOA: 06/08/2022 PCP: Ria Bush, MD  IC20A/IC20A-AA  LOS: 2 days   Brief hospital course:   Assessment & Plan: Neil Mcintyre is a 47 y.o. male with medical history significant of diverticulitis bleed status post sigmoid colectomy in 2017 and prior embolization at splenic flexure, hypertension, anxiety disorder, morbid obesity, asthma and depression who presented to the ER with 2 large episodes of rectal bleed at home.    Lower GI bleed 2/2 Diverticular bleed --frequent bright red BM's since presentation, Hgb dropped from 16.3 to 11.6 today. --GI and vascular surgery consulted  --Tagged RBC scan found bleeding at splenic flexure of the colon.  Per Dr. Lucky Cowboy, since pt already had embolization in the same area, repeat embolization is not the best option, so GI will try colonoscopy to locate and stop the bleeding site. --pt moved to stepdown in case pt starts bleeding out and decompensates. Plan: --Colonoscopy today, found no bleeding in colon, nor small bowel on intubation of TI.  Bleeding likely stopped.  Acute blood loss anemia --Hgb dropped from 16.3 on presentation to 9.2 this morning --1u pRBC given --since bleeding seems to have stopped, Hgb check daily, Transfuse if Hgb <8  Suspected focal enteritis --as seen on CT, however, no signs of infection and no abdominal pain.   #3 essential hypertension:  Hold blood pressure medicine in the setting of acute bleed.   --can resume home BP meds tomorrow if no more bleeding --IV hydralazine PRN with parameters   #4 morbid obesity, BMI 43   #5 hypokalemia:  Monitor and replete PRN   #6 hyperglycemia DM2 --A1c 6.8, this appears to be a new DM dx --encourage life style modification   DVT prophylaxis: SCD/Compression stockings Code Status: Full code  Family Communication:  Level of care: Stepdown Dispo:   The patient is from: home Anticipated d/c is to:  home Anticipated d/c date is: 1-2 days if Hgb stable and no more bleeding   Subjective and Interval History:  Colonoscopy today found no bleeding in colon, nor small bowel on intubation of TI.     Objective: Vitals:   06/11/22 1400 06/11/22 1600 06/11/22 1656 06/11/22 1700  BP: 95/61  (!) 158/133 (!) 155/92  Pulse: 94 (!) 113 (!) 116 (!) 110  Resp: 18 (!) '23 13 15  '$ Temp:    98.4 F (36.9 C)  TempSrc:    Oral  SpO2: 98% 98% 97% 98%  Weight:      Height:        Intake/Output Summary (Last 24 hours) at 06/11/2022 1746 Last data filed at 06/11/2022 1701 Gross per 24 hour  Intake 1620.86 ml  Output 251 ml  Net 1369.86 ml   Filed Weights   06/08/22 1958 06/10/22 2000  Weight: 122.5 kg 109.4 kg    Examination:   Constitutional: NAD, AAOx3 HEENT: conjunctivae and lids normal, EOMI CV: No cyanosis.   RESP: normal respiratory effort Neuro: II - XII grossly intact.   Psych: subdued mood and affect.     Data Reviewed: I have personally reviewed labs and imaging studies  Time spent: 35 minutes  Enzo Bi, MD [facility] If 7PM-7AM, please contact night-coverage 06/11/2022, 5:46 PM

## 2022-06-11 NOTE — Op Note (Signed)
[facility] Patient Name: Neil Mcintyre Procedure Date: 06/11/2022 12:37 PM MRN: [id number] Account #: [id number] Date of Birth: 1976/02/02 Admit Type: Inpatient Age: 47 Room: [facility] ENDO ROOM 2 Gender: Male Note Status: Finalized Instrument Name: Colonscope [id number] Procedure:             Colonoscopy Indications:           Hematochezia Providers:             Rueben Bash, DO Referring MD:          Ria Bush (Referring MD) Medicines:             Monitored Anesthesia Care Complications:         No immediate complications. Estimated blood loss: None. Procedure:             Pre-Anesthesia Assessment:                        - Prior to the procedure, a History and Physical was                         performed, and patient medications and allergies were                         reviewed. The patient is competent. The risks and                         benefits of the procedure and the sedation options and                         risks were discussed with the patient. All questions                         were answered and informed consent was obtained.                         Patient identification and proposed procedure were                         verified by the physician, the nurse, the anesthetist                         and the technician in the endoscopy suite. Mental                         Status Examination: alert and oriented. Airway                         Examination: normal oropharyngeal airway and neck                         mobility. Respiratory Examination: clear to                         auscultation. CV Examination: tachycardia noted.                         Prophylactic Antibiotics: The patient does not require  prophylactic antibiotics. Prior Anticoagulants: The                         patient has taken no anticoagulant or antiplatelet                         agents. ASA Grade Assessment: III - A  patient with                         severe systemic disease. After reviewing the risks and                         benefits, the patient was deemed in satisfactory                         condition to undergo the procedure. The anesthesia                         plan was to use monitored anesthesia care (MAC).                         Immediately prior to administration of medications,                         the patient was re-assessed for adequacy to receive                         sedatives. The heart rate, respiratory rate, oxygen                         saturations, blood pressure, adequacy of pulmonary                         ventilation, and response to care were monitored                         throughout the procedure. The physical status of the                         patient was re-assessed after the procedure.                        After obtaining informed consent, the colonoscope was                         passed under direct vision. Throughout the procedure,                         the patient's blood pressure, pulse, and oxygen                         saturations were monitored continuously. The                         Colonoscope was introduced through the anus and                         advanced to the the terminal ileum, with  identification of the appendiceal orifice and IC                         valve. The colonoscopy was performed without                         difficulty. The patient tolerated the procedure well.                         The quality of the bowel preparation was evaluated                         using the BBPS [facility] Bowel Preparation Scale) with                         scores of: Right Colon = 3, Transverse Colon = 3 and                         Left Colon = 3 (entire mucosa seen well with no                         residual staining, small fragments of stool or opaque                         liquid). The total BBPS score  equals 9. The terminal                         ileum, ileocecal valve, appendiceal orifice, and                         rectum were photographed. Findings:      The perianal and digital rectal examinations were normal. Pertinent       negatives include normal sphincter tone.      The terminal ileum appeared normal. no signs of fresh or old blood.       Estimated blood loss: none.      A 1 to 2 mm polyp was found in the descending colon. The polyp was       sessile. Polypectomy was not attempted due to active bleeding. Did not       want to confuse with other source. Estimated blood loss: none.      Multiple small-mouthed diverticula were found in the entire colon.       Estimated blood loss: none. No blood- fresh, old, or clot was       appreciated within any diverticula Estimated blood loss: none.      Normal mucosa was found in the entire colon. No blood - fresh or old       appreciated within the lumen      There was evidence of a prior end-to-end colo-colonic anastomosis at 25       cm proximal to the anus. This was patent and was characterized by       healthy appearing mucosa. The anastomosis was traversed. Estimated blood       loss: none.      Non-bleeding internal hemorrhoids were found during retroflexion. The       hemorrhoids were Grade I (internal hemorrhoids that do not prolapse).       Estimated blood loss: none.  Retroflexion in the right colon was performed. Impression:            - The examined portion of the ileum was normal.                        - One 1 to 2 mm polyp in the descending colon.                         Resection not attempted.                        - Diverticulosis in the entire examined colon.                        - Normal mucosa in the entire examined colon.                        - Patent end-to-end colo-colonic anastomosis,                         characterized by healthy appearing mucosa.                        - Non-bleeding internal  hemorrhoids.                        - No specimens collected. Recommendation:        - Patient has a contact number available for                         emergencies. The signs and symptoms of potential                         delayed complications were discussed with the patient.                         Return to normal activities tomorrow. Written                         discharge instructions were provided to the patient.                        - Return patient to ICU for ongoing care.                        - Resume previous diet.                        - Continue present medications.                        - Repeat colonoscopy for polyp removal within a year.                        - Return to referring physician as previously                         scheduled.                        - The findings and  recommendations were discussed with                         the patient.                        - The findings and recommendations were discussed with                         the referring physician. Procedure Code(s):     --- Professional ---                        [phone], Colonoscopy, flexible; diagnostic, including                         collection of specimen(s) by brushing or washing, when                         performed (separate procedure) Diagnosis Code(s):     --- Professional ---                        K64.0, First degree hemorrhoids                        D12.4, Benign neoplasm of descending colon                        Z98.0, Intestinal bypass and anastomosis status                        K92.1, Melena (includes Hematochezia)                        K57.30, Diverticulosis of large intestine without                         perforation or abscess without bleeding CPT copyright 2022 American Medical Association. All rights reserved. The codes documented in this report are preliminary and upon coder review may  be revised to meet current compliance requirements. Attending  Participation:      I personally performed the entire procedure. Volney American, DO Annamaria Helling DO, DO 06/11/2022 1:10:29 PM This report has been signed electronically. Number of Addenda: 0 Note Initiated On: 06/11/2022 12:37 PM Scope Withdrawal Time: 0 hours 12 minutes 49 seconds  Total Procedure Duration: 0 hours 17 minutes 12 seconds  Estimated Blood Loss:  Estimated blood loss: none.      [facility]

## 2022-06-11 NOTE — Interval H&P Note (Signed)
History and Physical Interval Note: Preprocedure H&P from 06/11/22  was reviewed and there was no interval change after seeing and examining the patient.  Written consent was obtained from the patient after discussion of risks, benefits, and alternatives. Patient has consented to proceed with Colonoscopy with possible intervention   06/11/2022 12:34 PM  Neil Mcintyre  has presented today for surgery, with the diagnosis of hematochezia.  The various methods of treatment have been discussed with the patient and family. After consideration of risks, benefits and other options for treatment, the patient has consented to  Procedure(s): COLONOSCOPY WITH PROPOFOL (N/A) as a surgical intervention.  The patient's history has been reviewed, patient examined, no change in status, stable for surgery.  I have reviewed the patient's chart and labs.  Questions were answered to the patient's satisfaction.     Annamaria Helling

## 2022-06-11 NOTE — Consult Note (Signed)
Hospital Consult    Reason for Consult:  GI Bleed Requesting Physician:  Dr Cindie Laroche MD MRN #:  [id number]  History of Present Illness: This is a 47 y.o. male with medical history significant of diverticulitis bleed status post sigmoid colectomy in 2017 and prior embolization at splenic flexure, hypertension, anxiety disorder, morbid obesity, asthma and depression who presented to the ER with 2 large episodes of rectal bleed at home.  While the patient has been here he is continue to have frequent bright red bowel movements.  His hemoglobin is dropped from 16.3-11.6 as of yesterday.  Patient had a tagged RBC scan yesterday which found bleeding at the splenic flexure of the colon.  After conversation with Dr. Lucky Cowboy yesterday patient is known to have had a prior embolization in the same area and repeat embolization would not be the best option at this time.  We recommend the GI try colonoscopy to locate and stop the bleeding.  If unable to do so we will discuss with the patient the options for reembolization.  Upon evaluation this morning in ICU patient was resting comfortably in bed.  Remains n.p.o. for colonoscopy today.  Patient's heart rate was 125 respiratory rate was 25.  Patient states that he was supposed to get some blood this morning prior to colonoscopy.  No complications overnight and patient's vitals remained stable.  Past Medical History:  Diagnosis Date   Anxiety    Diverticulitis of large intestine with perforation with bleeding 2015, 2017   Laparoscopic sigmoid colectomy 2017   Genital herpes    Hypertension    Irritability and anger    due to stress    Past Surgical History:  Procedure Laterality Date   COLONOSCOPY W/ POLYPECTOMY  09/2014   diverticulosis ([place])   CYSTOSCOPY W/ RETROGRADES Bilateral 08/16/2015   [name], MD   CYSTOSCOPY WITH STENT PLACEMENT Bilateral 08/16/2015   [name], MD   LAPAROSCOPIC SIGMOID COLECTOMY N/A 08/16/2015   recurrent  diverticulitis; [name], MD   REFRACTIVE SURGERY  1999   LASIK    No Known Allergies  Prior to Admission medications   Medication Sig Start Date End Date Taking? Authorizing Provider  albuterol (PROVENTIL) (2.5 MG/3ML) 0.083% nebulizer solution Take 3 mLs (2.5 mg total) by nebulization every 6 (six) hours as needed for wheezing or shortness of breath. Patient not taking: Reported on 02/25/2022 01/28/22   Scot Jun, FNP  albuterol (VENTOLIN HFA) 108 (90 Base) MCG/ACT inhaler Inhale 2 puffs into the lungs every 6 (six) hours as needed for wheezing or shortness of breath. Patient not taking: Reported on 02/25/2022 01/28/22   Scot Jun, FNP  amLODipine (NORVASC) 5 MG tablet Take 1 tablet (5 mg total) by mouth daily. 02/25/22   Ria Bush, MD  budesonide (PULMICORT FLEXHALER) 180 MCG/ACT inhaler Inhale 1 puff into the lungs in the morning and at bedtime. Patient not taking: Reported on 02/25/2022 05/22/21   Ria Bush, MD  buPROPion (WELLBUTRIN XL) 150 MG 24 hr tablet Take 1 tablet (150 mg total) by mouth daily. 02/25/22   Ria Bush, MD  cyclobenzaprine (FLEXERIL) 5 MG tablet Take 1 tablet (5 mg total) by mouth 2 (two) times daily as needed (headache). 02/25/22   Ria Bush, MD  LORazepam (ATIVAN) 0.5 MG tablet TAKE 1 TABLET (0.5 MG TOTAL) BY MOUTH AT BEDTIME AS NEEDED FOR ANXIETY OR SLEEP. 09/27/19   Ria Bush, MD  metoprolol succinate (TOPROL-XL) 25 MG 24 hr tablet Take 1  tablet (25 mg total) by mouth daily. 02/25/22   Ria Bush, MD  Nebulizers (COMPRESSOR/NEBULIZER) MISC 1 Units by Does not apply route every 4 (four) hours as needed. 06/02/21   Naaman Plummer, MD  valACYclovir (VALTREX) 500 MG tablet Take 1 tablet (500 mg total) by mouth daily. 05/24/22   Ria Bush, MD  valsartan-hydrochlorothiazide (DIOVAN-HCT) 160-25 MG tablet Take 1 tablet by mouth daily. 02/25/22   Ria Bush, MD    Social History   Socioeconomic  History   Marital status: Married    Spouse name: Not on file   Number of children: Not on file   Years of education: Not on file   Highest education level: Not on file  Occupational History   Not on file  Tobacco Use   Smoking status: Every Day    Types: Cigarettes, E-cigarettes    Start date: 06/1995    Last attempt to quit: 05/1996    Years since quitting: 26.1   Smokeless tobacco: Never  Vaping Use   Vaping Use: Never used  Substance and Sexual Activity   Alcohol use: Yes    Alcohol/week: 1.0 standard drink of alcohol    Types: 1 Cans of beer per week    Comment: socially   Drug use: No    Comment: some in HS   Sexual activity: Yes    Birth control/protection: None  Other Topics Concern   Not on file  Social History Narrative   Lives with wife, 2 children and mother   Occ: Leisure centre manager guard   Edu: tech school   Activity: active at work   Diet: on keto diet   Social Determinants of Health   Financial Resource Strain: Not on file  Food Insecurity: No Food Insecurity (06/09/2022)   Hunger Vital Sign    Worried About Running Out of Food in the Last Year: Never true    Ran Out of Food in the Last Year: Never true  Transportation Needs: No Transportation Needs (06/09/2022)   PRAPARE - Hydrologist (Medical): No    Lack of Transportation (Non-Medical): No  Physical Activity: Not on file  Stress: Not on file  Social Connections: Not on file  Intimate Partner Violence: Not At Risk (06/09/2022)   Humiliation, Afraid, Rape, and Kick questionnaire    Fear of Current or Ex-Partner: No    Emotionally Abused: No    Physically Abused: No    Sexually Abused: No     Family History  Adopted: Yes  Family history unknown: Yes    ROS: Otherwise negative unless mentioned in HPI  Physical Examination  Vitals:   06/11/22 1154 06/11/22 1200  BP: (!) 155/79 (!) 152/76  Pulse: (!) 122 (!) 126  Resp: (!) 28 17  Temp: 97.9 F (36.6 C)   SpO2:  98% 98%   Body mass index is 38.93 kg/m.  General:  WDWN in NAD Gait: Not observed HENT: WNL, normocephalic Pulmonary: normal non-labored breathing, without Rales, rhonchi,  wheezing Cardiac: regular but tachycardic at 125, without  Murmurs, rubs or gallops; without carotid bruits Abdomen: Positive bowel sounds soft, NT/ND, no masses. Positive Bloody Stools. Multiple per day.  Skin: without rashes Vascular Exam/Pulses: Positive throughout. Extremities: without ischemic changes, without Gangrene , without cellulitis; without open wounds;  Musculoskeletal: no muscle wasting or atrophy  Neurologic: A&O X 3;  No focal weakness or paresthesias are detected; speech is fluent/normal Psychiatric:  The pt has Normal affect. Lymph:  Unremarkable  CBC    Component Value Date/Time   WBC 21.1 (H) 06/11/2022 0544   RBC 3.13 (L) 06/11/2022 0544   HGB 9.2 (L) 06/11/2022 0544   HGB 15.9 03/01/2014 1026   HCT 27.9 (L) 06/11/2022 0544   HCT 49.8 03/01/2014 1026   PLT 266 06/11/2022 0544   PLT 338 03/01/2014 1026   MCV 89.1 06/11/2022 0544   MCV 92 03/01/2014 1026   MCH 29.4 06/11/2022 0544   MCHC 33.0 06/11/2022 0544   RDW 14.8 06/11/2022 0544   RDW 14.2 03/01/2014 1026   LYMPHSABS 1.1 08/08/2015 0958   LYMPHSABS 1.4 03/01/2014 1026   MONOABS 1.2 (H) 08/08/2015 0958   MONOABS 1.3 (H) 03/01/2014 1026   EOSABS 0.2 08/08/2015 0958   EOSABS 0.1 03/01/2014 1026   BASOSABS 0.1 08/08/2015 0958   BASOSABS 0.0 03/01/2014 1026    BMET    Component Value Date/Time   NA 136 06/11/2022 0544   NA 136 03/01/2014 1026   K 4.1 06/11/2022 0544   K 3.8 03/01/2014 1026   CL 105 06/11/2022 0544   CL 104 03/01/2014 1026   CO2 24 06/11/2022 0544   CO2 25 03/01/2014 1026   GLUCOSE 145 (H) 06/11/2022 0544   GLUCOSE 90 03/01/2014 1026   BUN 17 06/11/2022 0544   BUN 12 03/01/2014 1026   CREATININE 0.85 06/11/2022 0544   CREATININE 0.65 03/01/2014 1026   CALCIUM 8.1 (L) 06/11/2022 0544   CALCIUM 9.1  03/01/2014 1026   GFRNONAA >60 06/11/2022 0544   GFRNONAA >60 03/01/2014 1026   GFRAA >60 07/03/2016 1331   GFRAA >60 03/01/2014 1026    COAGS: Lab Results  Component Value Date   INR 1.05 08/08/2015     Non-Invasive Vascular Imaging:   Tagged RBC scan today found bleeding at splenic flexure of the colon.   NUCLEAR MEDICINE GASTROINTESTINAL BLEEDING SCAN   FINDINGS: Tracer activity which appears at the approximate area of the splenic flexure of the colon, increases with more delayed imaging, and moves antegrade to the expected area of the sigmoid colon.   IMPRESSION: Abnormal tracer activity consistent with active gastrointestinal bleed, likely source at the area of the splenic flexure of the colon.  Statin:  No. Beta Blocker:  Yes.   Aspirin:  No. ACEI:  No. ARB:  No. CCB use:  Yes Other antiplatelets/anticoagulants:  No.    ASSESSMENT/PLAN: This is a 47 y.o. male with medical history significant of diverticulitis bleed status post sigmoid colectomy in 2017 and prior embolization at splenic flexure, hypertension, anxiety disorder, morbid obesity, asthma and depression who presented to the ER with 2 large episodes of rectal bleed at home.  Upon exam this morning in ICU patient is resting comfortably in bed.  Lightly at the patient's side prepping for colonoscopy today.  Patient's vitals all remained stable no complications overnight.  Plan: Patient to have colonoscopy today to possibly control site of bleeding.  If unable to do so vascular surgery will discuss with the patient the procedure the risks the benefits the complications of a second embolization.  We will continue to follow.     Drema Pry [facility] 06/11/2022 12:07 PM

## 2022-06-11 NOTE — Transfer of Care (Signed)
Immediate Anesthesia Transfer of Care Note  Patient: Neil Mcintyre  Procedure(s) Performed: COLONOSCOPY WITH PROPOFOL  Patient Location: PACU and Endoscopy Unit  Anesthesia Type:General  Level of Consciousness: drowsy and patient cooperative  Airway & Oxygen Therapy: Patient Spontanous Breathing and Patient connected to nasal cannula oxygen  Post-op Assessment: Report given to RN and Post -op Vital signs reviewed and stable  Post vital signs: Reviewed and stable  Last Vitals:  Vitals Value Taken Time  BP 135/75 06/11/22 1305  Temp    Pulse 127 06/11/22 1307  Resp 38 06/11/22 1307  SpO2 93 % 06/11/22 1307  Vitals shown include unvalidated device data.  Last Pain:  Vitals:   06/11/22 1304  TempSrc:   PainSc: Asleep         Complications: No notable events documented.

## 2022-06-12 ENCOUNTER — Encounter: Payer: Self-pay | Admitting: Gastroenterology

## 2022-06-12 DIAGNOSIS — D500 Iron deficiency anemia secondary to blood loss (chronic): Secondary | ICD-10-CM

## 2022-06-12 DIAGNOSIS — I10 Essential (primary) hypertension: Secondary | ICD-10-CM

## 2022-06-12 DIAGNOSIS — D62 Acute posthemorrhagic anemia: Secondary | ICD-10-CM | POA: Diagnosis not present

## 2022-06-12 DIAGNOSIS — K5731 Diverticulosis of large intestine without perforation or abscess with bleeding: Secondary | ICD-10-CM | POA: Diagnosis not present

## 2022-06-12 DIAGNOSIS — K5791 Diverticulosis of intestine, part unspecified, without perforation or abscess with bleeding: Secondary | ICD-10-CM | POA: Insufficient documentation

## 2022-06-12 DIAGNOSIS — D509 Iron deficiency anemia, unspecified: Secondary | ICD-10-CM | POA: Insufficient documentation

## 2022-06-12 LAB — TYPE AND SCREEN
ABO/RH(D): O POS
Antibody Screen: NEGATIVE
Unit division: 0
Unit division: 0
Unit division: 0
Unit division: 0

## 2022-06-12 LAB — MAGNESIUM: Magnesium: 1.8 mg/dL (ref 1.7–2.4)

## 2022-06-12 LAB — BASIC METABOLIC PANEL
Anion gap: 6 (ref 5–15)
BUN: 12 mg/dL (ref 6–20)
CO2: 26 mmol/L (ref 22–32)
Calcium: 7.9 mg/dL — ABNORMAL LOW (ref 8.9–10.3)
Chloride: 105 mmol/L (ref 98–111)
Creatinine, Ser: 0.75 mg/dL (ref 0.61–1.24)
GFR, Estimated: >60 mL/min (ref >60)
Glucose, Bld: 110 mg/dL — ABNORMAL HIGH (ref 70–99)
Potassium: 3.6 mmol/L (ref 3.5–5.1)
Sodium: 137 mmol/L (ref 135–145)

## 2022-06-12 LAB — CBC
HCT: 25 % — ABNORMAL LOW (ref 39.0–52.0)
Hemoglobin: 8.1 g/dL — ABNORMAL LOW (ref 13.0–17.0)
MCH: 28.8 pg (ref 26.0–34.0)
MCHC: 32.4 g/dL (ref 30.0–36.0)
MCV: 89 fL (ref 80.0–100.0)
Platelets: 212 10*3/uL (ref 150–400)
RBC: 2.81 MIL/uL — ABNORMAL LOW (ref 4.22–5.81)
RDW: 15.9 % — ABNORMAL HIGH (ref 11.5–15.5)
WBC: 15.2 10*3/uL — ABNORMAL HIGH (ref 4.0–10.5)
nRBC: 1.2 % — ABNORMAL HIGH (ref 0.0–0.2)

## 2022-06-12 LAB — BPAM RBC
Blood Product Expiration Date: 202402132359
Blood Product Expiration Date: 202402132359
Blood Product Expiration Date: 202402132359
Blood Product Expiration Date: 202402132359
ISSUE DATE / TIME: 202401090908
Unit Type and Rh: 5100
Unit Type and Rh: 5100
Unit Type and Rh: 5100
Unit Type and Rh: 5100

## 2022-06-12 LAB — PREPARE RBC (CROSSMATCH)

## 2022-06-12 LAB — IRON AND TIBC
Iron: 36 ug/dL — ABNORMAL LOW (ref 45–182)
Saturation Ratios: 11 % — ABNORMAL LOW (ref 17.9–39.5)
TIBC: 329 ug/dL (ref 250–450)
UIBC: 293 ug/dL

## 2022-06-12 LAB — VITAMIN B12: Vitamin B-12: 217 pg/mL (ref 180–914)

## 2022-06-12 LAB — FERRITIN: Ferritin: 30 ng/mL (ref 24–336)

## 2022-06-12 MED ORDER — METOPROLOL SUCCINATE ER 25 MG PO TB24
25.0000 mg | ORAL_TABLET | Freq: Every day | ORAL | Status: DC
  Administered 2022-06-12 – 2022-06-13 (×2): 25 mg via ORAL
  Filled 2022-06-12 (×2): qty 1

## 2022-06-12 MED ORDER — CYANOCOBALAMIN 1000 MCG/ML IJ SOLN
1000.0000 ug | Freq: Once | INTRAMUSCULAR | Status: AC
  Administered 2022-06-12: 1000 ug via INTRAMUSCULAR
  Filled 2022-06-12: qty 1

## 2022-06-12 MED ORDER — SODIUM CHLORIDE 0.9 % IV SOLN
300.0000 mg | Freq: Once | INTRAVENOUS | Status: AC
  Administered 2022-06-12: 300 mg via INTRAVENOUS
  Filled 2022-06-12: qty 15

## 2022-06-12 MED ORDER — AMLODIPINE BESYLATE 5 MG PO TABS
5.0000 mg | ORAL_TABLET | Freq: Every day | ORAL | Status: DC
  Administered 2022-06-12 – 2022-06-13 (×2): 5 mg via ORAL
  Filled 2022-06-12 (×2): qty 1

## 2022-06-12 NOTE — Anesthesia Postprocedure Evaluation (Signed)
Anesthesia Post Note  Patient: Neil Mcintyre  Procedure(s) Performed: COLONOSCOPY WITH PROPOFOL  Patient location during evaluation: Endoscopy Anesthesia Type: General Level of consciousness: awake and alert Pain management: pain level controlled Vital Signs Assessment: post-procedure vital signs reviewed and stable Respiratory status: spontaneous breathing, nonlabored ventilation, respiratory function stable and patient connected to nasal cannula oxygen Cardiovascular status: blood pressure returned to baseline and stable Postop Assessment: no apparent nausea or vomiting Anesthetic complications: no   No notable events documented.   Last Vitals:  Vitals:   06/12/22 0600 06/12/22 0700  BP: 114/72 118/89  Pulse: 86 86  Resp: 18 (!) 9  Temp:    SpO2: 98% 100%    Last Pain:  Vitals:   06/12/22 0249  TempSrc: Oral  PainSc: 0-No pain                 Precious Haws Blonnie Maske

## 2022-06-12 NOTE — Hospital Course (Signed)
Neil Mcintyre is a 47 y.o. male with medical history significant of diverticulitis bleed status post sigmoid colectomy in 2017 and prior embolization at splenic flexure, hypertension, anxiety disorder, morbid obesity, asthma and depression who presented to the ER with 2 large episodes of rectal bleed at home.  Patient required 1 unit of blood transfusion.  GI bleeding scan showed possible bleeding at the splenic flexure.  Patient had a prior thrombosis in this area, Dr. Lucky Cowboy did not recommend additional treatment.  Patient had a colonoscopy performed on 1/9, no additional bleeding was seen. Patient also received IV iron for iron deficient anemia.

## 2022-06-12 NOTE — Progress Notes (Signed)
  Progress Note   Patient: Neil Mcintyre JWJ:[id number] DOB: [birth year] DOA: 06/08/2022     3 DOS: the patient was seen and examined on 06/12/2022   Brief hospital course: Neil Mcintyre is a 47 y.o. male with medical history significant of diverticulitis bleed status post sigmoid colectomy in 2017 and prior embolization at splenic flexure, hypertension, anxiety disorder, morbid obesity, asthma and depression who presented to the ER with 2 large episodes of rectal bleed at home.  Patient required 1 unit of blood transfusion.  GI bleeding scan showed possible bleeding at the splenic flexure.  Patient had a prior thrombosis in this area, Dr. Lucky Cowboy did not recommend additional treatment.  Patient had a colonoscopy performed on 1/9, no additional bleeding was seen. Patient also received IV iron for iron deficient anemia.  Assessment and Plan:  Lower GI bleed 2/2 Diverticular bleed Acute blood loss anemia Iron deficient anemia. Patient condition appears to be improving, hemoglobin 8.1 today, patient has significant iron deficiency, will give IV iron. Patient had not had a bowel movement since yesterday, no rectal bleeding.  Condition seem to be improved.  Patient most likely will be discharged home tomorrow if hemoglobin is better. Transfer patient back to MedSurg.    Suspected focal enteritis --as seen on CT, however, no signs of infection and no abdominal pain.   #3 essential hypertension:  Restart amlodipine and metoprolol.   #4 morbid obesity, BMI 38.93 Diet and exercise advised.  #5 hypokalemia:  Monitor and replete PRN   #6 hyperglycemia DM2 --A1c 6.8, this appears to be a new DM dx --encourage life style modification     Subjective:  Today, no additional rectal bleeding, patient has no bowel movement today. No nausea vomiting.  Physical Exam: Vitals:   06/12/22 0600 06/12/22 0700 06/12/22 0800 06/12/22 1300  BP: 114/72 118/89 123/79 (!) 145/83  Pulse: 86 86 85 94  Resp:  18 (!) '9 16 10  '$ Temp:    98.8 F (37.1 C)  TempSrc:    Oral  SpO2: 98% 100% 97% 96%  Weight:      Height:       General exam: Appears calm and comfortable  Respiratory system: Clear to auscultation. Respiratory effort normal. Cardiovascular system: S1 & S2 heard, RRR. No JVD, murmurs, rubs, gallops or clicks. No pedal edema. Gastrointestinal system: Abdomen is nondistended, soft and nontender. No organomegaly or masses felt. Normal bowel sounds heard. Central nervous system: Alert and oriented. No focal neurological deficits. Extremities: Symmetric 5 x 5 power. Skin: No rashes, lesions or ulcers Psychiatry: Judgement and insight appear normal. Mood & affect appropriate.   Data Reviewed:  Lab results reviewed.  Imaging study reviewed.  Family Communication: None  Disposition: Status is: Inpatient Remains inpatient appropriate because: Severity of disease.  Planned Discharge Destination: Home    Time spent: 35 minutes  Author: Sharen Hones, MD 06/12/2022 1:46 PM  For on call review [email or web address].

## 2022-06-12 NOTE — Progress Notes (Signed)
Inpatient Follow-up/Progress Note   Patient ID: Neil Mcintyre is a 47 y.o. male.  Overnight Events / Subjective Findings NAEON. No further hematochezia. HR improved. No abdominal pain, n/v. Colonoscopy yesterday negative for active bleeding. Leukocytosis from prep procedure improving as well.  Review of Systems  Constitutional:  Negative for activity change, appetite change, chills, diaphoresis, fatigue, fever and unexpected weight change.  HENT:  Negative for trouble swallowing and voice change.   Respiratory:  Negative for shortness of breath and wheezing.   Cardiovascular:  Negative for chest pain, palpitations and leg swelling.  Gastrointestinal:  Positive for blood in stool. Negative for abdominal distention, abdominal pain, anal bleeding, constipation, diarrhea, nausea and vomiting.  Musculoskeletal:  Negative for arthralgias and myalgias.  Skin:  Negative for color change and pallor.  Neurological:  Negative for dizziness, syncope and weakness.  Psychiatric/Behavioral:  Negative for confusion. The patient is not nervous/anxious.   All other systems reviewed and are negative.    Medications  Current Facility-Administered Medications:    0.9 %  sodium chloride infusion (Manually program via Guardrails IV Fluids), , Intravenous, Once, Enzo Bi, MD, Held at 06/10/22 2040   amLODipine (NORVASC) tablet 5 mg, 5 mg, Oral, Daily, Sharen Hones, MD, 5 mg at 06/12/22 1403   Chlorhexidine Gluconate Cloth 2 % PADS 6 each, 6 each, Topical, Daily, Enzo Bi, MD, 6 each at 06/12/22 1407   hydrALAZINE (APRESOLINE) injection 5 mg, 5 mg, Intravenous, Q6H PRN, Enzo Bi, MD, 5 mg at 06/11/22 1151   iron sucrose (VENOFER) 300 mg in sodium chloride 0.9 % 250 mL IVPB, 300 mg, Intravenous, Once, Sharen Hones, MD, Last Rate: 176.7 mL/hr at 06/12/22 1407, 300 mg at 06/12/22 1407   metoprolol succinate (TOPROL-XL) 24 hr tablet 25 mg, 25 mg, Oral, Daily, Sharen Hones, MD, 25 mg at 06/12/22 1403    ondansetron (ZOFRAN) tablet 4 mg, 4 mg, Oral, Q6H PRN **OR** ondansetron (ZOFRAN) injection 4 mg, 4 mg, Intravenous, Q6H PRN, Elwyn Reach, MD   Oral care mouth rinse, 15 mL, Mouth Rinse, PRN, Enzo Bi, MD   polyethylene glycol-electrolytes (NuLYTELY) solution 2,000 mL, 2,000 mL, Oral, Once PRN, Annamaria Helling, DO  iron sucrose 300 mg (06/12/22 1407)    hydrALAZINE, ondansetron **OR** ondansetron (ZOFRAN) IV, mouth rinse, polyethylene glycol-electrolytes   Objective    Vitals:   06/12/22 0600 06/12/22 0700 06/12/22 0800 06/12/22 1300  BP: 114/72 118/89 123/79 (!) 145/83  Pulse: 86 86 85 94  Resp: 18 (!) '9 16 10  '$ Temp:    98.8 F (37.1 C)  TempSrc:    Oral  SpO2: 98% 100% 97% 96%  Weight:      Height:         Physical Exam Vitals and nursing note reviewed.  Constitutional:      General: He is not in acute distress.    Appearance: Normal appearance. He is obese. He is not ill-appearing, toxic-appearing or diaphoretic.  HENT:     Head: Normocephalic and atraumatic.     Nose: Nose normal.     Mouth/Throat:     Mouth: Mucous membranes are moist.     Pharynx: Oropharynx is clear.  Eyes:     General: No scleral icterus.    Extraocular Movements: Extraocular movements intact.  Cardiovascular:     Rate and Rhythm: Normal rate and regular rhythm.     Heart sounds: Normal heart sounds. No murmur heard.    No friction rub. No gallop.  Pulmonary:  Effort: Pulmonary effort is normal. No respiratory distress.     Breath sounds: Normal breath sounds. No wheezing, rhonchi or rales.  Abdominal:     General: Bowel sounds are normal. There is no distension.     Palpations: Abdomen is soft.     Tenderness: There is no abdominal tenderness. There is no guarding or rebound.  Musculoskeletal:     Cervical back: Neck supple.     Right lower leg: No edema.     Left lower leg: No edema.  Skin:    General: Skin is warm and dry.     Coloration: Skin is not jaundiced or pale.   Neurological:     General: No focal deficit present.     Mental Status: He is alert and oriented to person, place, and time. Mental status is at baseline.  Psychiatric:        Mood and Affect: Mood normal.        Behavior: Behavior normal.        Thought Content: Thought content normal.        Judgment: Judgment normal.      Laboratory Data Recent Labs  Lab 06/10/22 0256 06/10/22 1328 06/11/22 0544 06/11/22 1454 06/12/22 0610  WBC 10.9*  --  21.1*  --  15.2*  HGB 13.0   < > 9.2* 9.5* 8.1*  HCT 40.7   < > 27.9* 29.1* 25.0*  PLT 252  --  266  --  212   < > = values in this interval not displayed.    Recent Labs  Lab 06/08/22 2001 06/09/22 0155 06/10/22 0256 06/11/22 0544 06/12/22 0610  NA 137   < > 135 136 137  K 3.4*   < > 3.3* 4.1 3.6  CL 103   < > 102 105 105  CO2 24   < > '26 24 26  '$ BUN 15   < > '13 17 12  '$ CREATININE 0.87   < > 0.68 0.85 0.75  CALCIUM 9.3   < > 8.2* 8.1* 7.9*  PROT 7.0  --   --   --   --   BILITOT 0.7  --   --   --   --   ALKPHOS 84  --   --   --   --   ALT 24  --   --   --   --   AST 26  --   --   --   --   GLUCOSE 192*   < > 111* 145* 110*   < > = values in this interval not displayed.    No results for input(s): "INR" in the last 168 hours.    Imaging Studies: NM GI Blood Loss  Addendum Date: 06/10/2022   ADDENDUM REPORT: 06/10/2022 17:38 ADDENDUM: Critical Value/emergent results were called by telephone at the time of interpretation on 06/10/2022 at 5:37 pm to provider TINA LAI , who verbally acknowledged these results. Electronically Signed   By: Yetta Glassman M.D.   On: 06/10/2022 17:38   Result Date: 06/10/2022 CLINICAL DATA:  Rectal bleeding EXAM: NUCLEAR MEDICINE GASTROINTESTINAL BLEEDING SCAN TECHNIQUE: Sequential abdominal images were obtained following intravenous administration of Tc-31mlabeled red blood cells. RADIOPHARMACEUTICALS:  18.8 mCi Tc-933mertechnetate in-vitro labeled red cells. COMPARISON:  None Available.  FINDINGS: Tracer activity which appears at the approximate area of the splenic flexure of the colon, increases with more delayed imaging, and moves antegrade to the expected area of the sigmoid colon. IMPRESSION:  Abnormal tracer activity consistent with active gastrointestinal bleed, likely source at the area of the splenic flexure of the colon. Critical Value/emergent results will be called by telephone and addendum made to this report document communication. Electronically Signed: By: Yetta Glassman M.D. On: 06/10/2022 17:30    Assessment:  # LGIB- splenic flexure bleed - positive tagged rbc scan - h/o diverticular bleed (2017) s/p embolization and partial colectomy (sigmoid) - colonoscopy 06/12/22 without any blood within lumen and no active bleeding  # ABLA  - hgb down from 16 (06/09/21) to 8.1; suspect some equilibration - vital signs returned to normal -Patient received 1 unit PRBC 06/11/2022  # Obesity  # Diverticulosis with h/o bleed  # leukocytosis - improving; afebrile  # colon polyp- noted on colonoscopy yesterday  Plan:  Colonoscopy negative for active bleed. Should further bleeding occur, consider vascular or surgical intervention Labs reviewed. Hgb suspected equilibrating with improvement in vital signs Pt would like to speak with surgery regarding prophylactic partial colectomy in setting of repeat life threatening bleed Tolerating po. Advance as tolerated Can d/c protonix unless home medication  Hold dvt ppx Monitor H&H.  Transfusion and resuscitation as per primary team Avoid frequent lab draws to prevent lab induced anemia Supportive care and antiemetics as per primary team Maintain two sites IV access Avoid nsaids Monitor for GIB.  No further interventions planned by GI at this time. Will sign off. Call back if needed. Pt will need follow up outpatient for polyp removal noted on colonoscopy yesterday- provided office card  I personally performed the  service.  Management of other medical comorbidities as per primary team  Thank you for allowing [place] to participate in this patient's care. Please don't hesitate to call if any questions or concerns arise.   Annamaria Helling, DO [facility]  Portions of the record may have been created with voice recognition software. Occasional wrong-word or 'sound-a-like' substitutions may have occurred due to the inherent limitations of voice recognition software.  Read the chart carefully and recognize, using context, where substitutions may have occurred.

## 2022-06-12 NOTE — Progress Notes (Signed)
Report called to receiving RN by Louie Casa, RN. New IV placed by IV team.

## 2022-06-13 DIAGNOSIS — D500 Iron deficiency anemia secondary to blood loss (chronic): Secondary | ICD-10-CM | POA: Diagnosis not present

## 2022-06-13 DIAGNOSIS — K5731 Diverticulosis of large intestine without perforation or abscess with bleeding: Secondary | ICD-10-CM | POA: Diagnosis not present

## 2022-06-13 DIAGNOSIS — D62 Acute posthemorrhagic anemia: Secondary | ICD-10-CM | POA: Diagnosis not present

## 2022-06-13 LAB — CBC
HCT: 26.7 % — ABNORMAL LOW (ref 39.0–52.0)
Hemoglobin: 8.5 g/dL — ABNORMAL LOW (ref 13.0–17.0)
MCH: 29 pg (ref 26.0–34.0)
MCHC: 31.8 g/dL (ref 30.0–36.0)
MCV: 91.1 fL (ref 80.0–100.0)
Platelets: 262 10*3/uL (ref 150–400)
RBC: 2.93 MIL/uL — ABNORMAL LOW (ref 4.22–5.81)
RDW: 16.2 % — ABNORMAL HIGH (ref 11.5–15.5)
WBC: 15.2 10*3/uL — ABNORMAL HIGH (ref 4.0–10.5)
nRBC: 2.7 % — ABNORMAL HIGH (ref 0.0–0.2)

## 2022-06-13 LAB — BASIC METABOLIC PANEL
Anion gap: 8 (ref 5–15)
BUN: 10 mg/dL (ref 6–20)
CO2: 25 mmol/L (ref 22–32)
Calcium: 8 mg/dL — ABNORMAL LOW (ref 8.9–10.3)
Chloride: 103 mmol/L (ref 98–111)
Creatinine, Ser: 0.83 mg/dL (ref 0.61–1.24)
GFR, Estimated: >60 mL/min (ref >60)
Glucose, Bld: 100 mg/dL — ABNORMAL HIGH (ref 70–99)
Potassium: 3.3 mmol/L — ABNORMAL LOW (ref 3.5–5.1)
Sodium: 136 mmol/L (ref 135–145)

## 2022-06-13 LAB — MAGNESIUM: Magnesium: 2.1 mg/dL (ref 1.7–2.4)

## 2022-06-13 MED ORDER — LACTULOSE 10 GM/15ML PO SOLN: 20.0000 g | Freq: Once | ORAL | Status: DC

## 2022-06-13 MED ORDER — SENNOSIDES-DOCUSATE SODIUM 8.6-50 MG PO TABS: 1.0000 | ORAL_TABLET | Freq: Two times a day (BID) | ORAL | 0 refills | Status: DC

## 2022-06-13 MED ORDER — VITAMIN B-12 1000 MCG PO TABS: 1000.0000 ug | ORAL_TABLET | Freq: Every day | ORAL | 0 refills | Status: DC

## 2022-06-13 MED ORDER — CYANOCOBALAMIN 1000 MCG/ML IJ SOLN
1000.0000 ug | Freq: Once | INTRAMUSCULAR | Status: AC
  Administered 2022-06-13: 1000 ug via INTRAMUSCULAR
  Filled 2022-06-13: qty 1

## 2022-06-13 MED ORDER — POTASSIUM CHLORIDE CRYS ER 20 MEQ PO TBCR
40.0000 meq | EXTENDED_RELEASE_TABLET | ORAL | Status: AC
  Administered 2022-06-13 (×2): 40 meq via ORAL
  Filled 2022-06-13 (×2): qty 2

## 2022-06-13 MED ORDER — FERROUS SULFATE 325 (65 FE) MG PO TABS: 325.0000 mg | ORAL_TABLET | Freq: Every day | ORAL | 0 refills | Status: DC

## 2022-06-13 NOTE — Plan of Care (Signed)
Pt had a BM without blood. Potassium replaced.  Problem: Education: Goal: Knowledge of General Education information will improve Description: Including pain rating scale, medication(s)/side effects and non-pharmacologic comfort measures Outcome: Adequate for Discharge   Problem: Health Behavior/Discharge Planning: Goal: Ability to manage health-related needs will improve Outcome: Adequate for Discharge   Problem: Clinical Measurements: Goal: Ability to maintain clinical measurements within normal limits will improve Outcome: Adequate for Discharge Goal: Will remain free from infection Outcome: Adequate for Discharge Goal: Diagnostic test results will improve Outcome: Adequate for Discharge Goal: Respiratory complications will improve Outcome: Adequate for Discharge Goal: Cardiovascular complication will be avoided Outcome: Adequate for Discharge   Problem: Activity: Goal: Risk for activity intolerance will decrease Outcome: Adequate for Discharge   Problem: Nutrition: Goal: Adequate nutrition will be maintained Outcome: Adequate for Discharge   Problem: Coping: Goal: Level of anxiety will decrease Outcome: Adequate for Discharge   Problem: Elimination: Goal: Will not experience complications related to bowel motility Outcome: Adequate for Discharge Goal: Will not experience complications related to urinary retention Outcome: Adequate for Discharge   Problem: Pain Managment: Goal: General experience of comfort will improve Outcome: Adequate for Discharge   Problem: Safety: Goal: Ability to remain free from injury will improve Outcome: Adequate for Discharge   Problem: Skin Integrity: Goal: Risk for impaired skin integrity will decrease Outcome: Adequate for Discharge

## 2022-06-13 NOTE — TOC Progression Note (Signed)
Transition of Care [facility]) - Progression Note    Patient Details  Name: Neil Mcintyre MRN: [id number] Date of Birth: 1976-03-07  [facility]) CM/SW Martin, LCSW Phone Number: 06/13/2022, 10:13 AM  Clinical Narrative:     Per chart review.  No TOC needs noted.       Expected Discharge Plan and Services         Expected Discharge Date: 06/13/22                                     Social Determinants of Health (SDOH) Interventions SDOH Screenings   Food Insecurity: No Food Insecurity (06/09/2022)  Housing: Low Risk  (06/09/2022)  Transportation Needs: No Transportation Needs (06/09/2022)  Utilities: Not At Risk (06/09/2022)  Depression (PHQ2-9): Low Risk  (01/24/2021)  Tobacco Use: High Risk (06/12/2022)    Readmission Risk Interventions     No data to display

## 2022-06-13 NOTE — Discharge Summary (Signed)
Physician Discharge Summary   Patient: Neil Mcintyre MRN: [id number] DOB: 1976/05/15  Admit date:     06/08/2022  Discharge date: 06/13/22  Discharge Physician: Sharen Hones   PCP: Ria Bush, MD   Recommendations at discharge:   Follow-up with PCP in 1 week. Refer to general surgery in [facility] by PCP. Recheck a CBC, BMP at next visit. Follow-up homocystine level, refill B12 if homocystine is elevated.  Discharge Diagnoses: Principal Problem:   Lower GI bleed Active Problems:   Anxiety   Primary hypertension   Obesity, morbid, BMI 40.0-49.9 (HCC)   Dyslipidemia   Mood disorder (HCC)   Hypokalemia   Enteritis of infectious origin   GI bleed   Diverticulosis of intestine with bleeding   Acute blood loss anemia   Iron deficiency anemia  Resolved Problems:   * No resolved hospital problems. *  Hospital Course: Neil Mcintyre is a 47 y.o. male with medical history significant of diverticulitis bleed status post sigmoid colectomy in 2017 and prior embolization at splenic flexure, hypertension, anxiety disorder, morbid obesity, asthma and depression who presented to the ER with 2 large episodes of rectal bleed at home.  Patient required 1 unit of blood transfusion.  GI bleeding scan showed possible bleeding at the splenic flexure.  Patient had a prior thrombosis in this area, Dr. Lucky Cowboy did not recommend additional treatment.  Patient had a colonoscopy performed on 1/9, no additional bleeding was seen. Patient also received IV iron for iron deficient anemia. Patient condition has improved, had a normal bowel movement today without bleeding.  GI has signed off.  Medically stable to be discharged. Assessment and Plan:  Lower GI bleed 2/2 Diverticular bleed Acute blood loss anemia Iron deficient anemia. Patient condition has improved, no additional bleeding with normal bowel movement today.  GI has signed off.  Patient received IV iron, also received 2 doses of B12  injection.  Patient B12 level is borderline low, currently pending homocystine level.  Will continue oral iron and B12 supplements.  Patient will be followed up with PCP as well as GI. Patient preferred to be referred to general surgery by PCP.  Given patient obesity, patient probably should be seen by general surgery in [facility].     Suspected focal enteritis --as seen on CT, however, no signs of infection and no abdominal pain. Follow-up with GI as outpatient.  #3 essential hypertension:  Restarted amlodipine and metoprolol.  Blood pressure not significant elevated, ARB/HCTZ on hold, may restart after seen by PCP if blood pressure  going high.   #4 morbid obesity, BMI 38.93 Diet and exercise advised.   #5 hypokalemia:  Potassium is 3.3, will give additional 80 mEq oral potassium.   #6 hyperglycemia DM2 --A1c 6.8, this appears to be a new DM dx --encourage life style modification Follow-up with PCP as outpatient.      Consultants: GI, Vascular Procedures performed: Colonoscopy  Disposition: Home Diet recommendation:  Discharge Diet Orders (From admission, onward)     Start     Ordered   06/13/22 0000  Diet - low sodium heart healthy        06/13/22 0954           Cardiac diet DISCHARGE MEDICATION: Allergies as of 06/13/2022   No Known Allergies      Medication List     STOP taking these medications    valsartan-hydrochlorothiazide 160-25 MG tablet Commonly known as: DIOVAN-HCT  TAKE these medications    albuterol 108 (90 Base) MCG/ACT inhaler Commonly known as: VENTOLIN HFA Inhale 2 puffs into the lungs every 6 (six) hours as needed for wheezing or shortness of breath.   albuterol (2.5 MG/3ML) 0.083% nebulizer solution Commonly known as: PROVENTIL Take 3 mLs (2.5 mg total) by nebulization every 6 (six) hours as needed for wheezing or shortness of breath.   amLODipine 5 MG tablet Commonly known as: NORVASC Take 1 tablet (5 mg  total) by mouth daily.   buPROPion 150 MG 24 hr tablet Commonly known as: WELLBUTRIN XL Take 1 tablet (150 mg total) by mouth daily.   Compressor/Nebulizer Misc 1 Units by Does not apply route every 4 (four) hours as needed.   cyanocobalamin 1000 MCG tablet Commonly known as: VITAMIN B12 Take 1 tablet (1,000 mcg total) by mouth daily for 14 days.   cyclobenzaprine 5 MG tablet Commonly known as: FLEXERIL Take 1 tablet (5 mg total) by mouth 2 (two) times daily as needed (headache).   ferrous sulfate 325 (65 FE) MG tablet Take 1 tablet (325 mg total) by mouth daily.   LORazepam 0.5 MG tablet Commonly known as: ATIVAN TAKE 1 TABLET (0.5 MG TOTAL) BY MOUTH AT BEDTIME AS NEEDED FOR ANXIETY OR SLEEP.   metoprolol succinate 25 MG 24 hr tablet Commonly known as: TOPROL-XL Take 1 tablet (25 mg total) by mouth daily.   Pulmicort Flexhaler 180 MCG/ACT inhaler Generic drug: budesonide Inhale 1 puff into the lungs in the morning and at bedtime.   senna-docusate 8.6-50 MG tablet Commonly known as: Senokot-S Take 1 tablet by mouth 2 (two) times daily.   valACYclovir 500 MG tablet Commonly known as: VALTREX Take 1 tablet (500 mg total) by mouth daily.        Follow-up Information     Ria Bush, MD Follow up in 1 week(s).   Specialty: Family Medicine Contact information: [place] [address] [phone]         Annamaria Helling, DO Follow up in 2 week(s).   Specialty: Gastroenterology Contact information: [facility] [place] [address] [phone]                Discharge Exam: Danley Danker Weights   06/08/22 1958 06/10/22 2000  Weight: 122.5 kg 109.4 kg   General exam: Appears calm and comfortable, obese  Respiratory system: Clear to auscultation. Respiratory effort normal. Cardiovascular system: S1 & S2 heard, RRR. No JVD, murmurs, rubs, gallops or clicks. No pedal edema. Gastrointestinal  system: Abdomen is nondistended, soft and nontender. No organomegaly or masses felt. Normal bowel sounds heard. Central nervous system: Alert and oriented. No focal neurological deficits. Extremities: Symmetric 5 x 5 power. Skin: No rashes, lesions or ulcers Psychiatry: Judgement and insight appear normal. Mood & affect appropriate.    Condition at discharge: good  The results of significant diagnostics from this hospitalization (including imaging, microbiology, ancillary and laboratory) are listed below for reference.   Imaging Studies: NM GI Blood Loss  Addendum Date: 06/10/2022   ADDENDUM REPORT: 06/10/2022 17:38 ADDENDUM: Critical Value/emergent results were called by telephone at the time of interpretation on 06/10/2022 at 5:37 pm to provider TINA LAI , who verbally acknowledged these results. Electronically Signed   By: Yetta Glassman M.D.   On: 06/10/2022 17:38   Result Date: 06/10/2022 CLINICAL DATA:  Rectal bleeding EXAM: NUCLEAR MEDICINE GASTROINTESTINAL BLEEDING SCAN TECHNIQUE: Sequential abdominal images were obtained following intravenous administration of Tc-58mlabeled  red blood cells. RADIOPHARMACEUTICALS:  18.8 mCi Tc-75mpertechnetate in-vitro labeled red cells. COMPARISON:  None Available. FINDINGS: Tracer activity which appears at the approximate area of the splenic flexure of the colon, increases with more delayed imaging, and moves antegrade to the expected area of the sigmoid colon. IMPRESSION: Abnormal tracer activity consistent with active gastrointestinal bleed, likely source at the area of the splenic flexure of the colon. Critical Value/emergent results will be called by telephone and addendum made to this report document communication. Electronically Signed: By: LYetta GlassmanM.D. On: 06/10/2022 17:30   CT ANGIO GI BLEED  Result Date: 06/08/2022 CLINICAL DATA:  Bloody stool EXAM: CTA ABDOMEN AND PELVIS WITHOUT AND WITH CONTRAST TECHNIQUE: Multidetector CT imaging of  the abdomen and pelvis was performed using the standard protocol during bolus administration of intravenous contrast. Multiplanar reconstructed images and MIPs were obtained and reviewed to evaluate the vascular anatomy. RADIATION DOSE REDUCTION: This exam was performed according to the departmental dose-optimization program which includes automated exposure control, adjustment of the mA and/or kV according to patient size and/or use of iterative reconstruction technique. CONTRAST:  1055mOMNIPAQUE IOHEXOL 350 MG/ML SOLN COMPARISON:  CT abdomen and pelvis 07/05/2015 FINDINGS: VASCULAR Aorta: Normal caliber aorta without aneurysm, dissection, vasculitis or significant stenosis. Celiac: Patent without evidence of aneurysm, dissection, vasculitis or significant stenosis. SMA: Patent without evidence of aneurysm, dissection, vasculitis or significant stenosis. Renals: Both renal arteries are patent without evidence of aneurysm, dissection, vasculitis, fibromuscular dysplasia or significant stenosis. Bilateral accessory renal arteries are present. IMA: Patent without evidence of aneurysm, dissection, vasculitis or significant stenosis. Inflow: Patent without evidence of aneurysm, dissection, vasculitis or significant stenosis. Proximal Outflow: Bilateral common femoral and visualized portions of the superficial and profunda femoral arteries are patent without evidence of aneurysm, dissection, vasculitis or significant stenosis. Veins: No obvious venous abnormality within the limitations of this arterial phase study. Review of the MIP images confirms the above findings. NON-VASCULAR Lower chest: No acute abnormality. Hepatobiliary: No focal liver abnormality is seen. No gallstones, gallbladder wall thickening, or biliary dilatation. Pancreas: Unremarkable. No pancreatic ductal dilatation or surrounding inflammatory changes. Spleen: Normal in size without focal abnormality. Adrenals/Urinary Tract: Adrenal glands are  unremarkable. There is no hydronephrosis or perinephric fat stranding. 1 cm cyst is noted in the right kidney. Bladder is unremarkable. Stomach/Bowel: There is no evidence for bowel obstruction, pneumatosis or free air. No active gastrointestinal bleeding identified. There is a sigmoid colon anastomosis. There is descending colon and transverse colon diverticulosis without evidence for diverticulitis. The stomach appears within normal limits. There is a short loop of prominent small bowel in the right mid abdomen demonstrating wall thickening with some adjacent mesenteric lymphadenopathy. This is new from prior examination and best seen on axial image 9/114. Lymphatic: No enlarged lymph nodes are identified in the retroperitoneum or pelvis. Reproductive: Prostate is unremarkable. Other: No abdominal wall hernia or abnormality. No abdominopelvic ascites. Musculoskeletal: No acute or significant osseous findings. IMPRESSION: 1. No evidence for active gastrointestinal bleeding. NON-VASCULAR 1. Short loop of prominent small bowel in the right mid abdomen with wall thickening and adjacent mesenteric lymphadenopathy. Findings are concerning for focal enteritis. Follow-up recommended to exclude underlying etiologies. 2. Colonic diverticulosis without evidence for diverticulitis. Electronically Signed   By: AmRonney Asters.D.   On: 06/08/2022 23:18    Microbiology: Results for orders placed or performed during the hospital encounter of 06/08/22  Gastrointestinal Panel by PCR , Stool     Status: None   Collection  Time: 06/09/22  1:25 AM   Specimen: Stool  Result Value Ref Range Status   Campylobacter species NOT DETECTED NOT DETECTED Final   Plesimonas shigelloides NOT DETECTED NOT DETECTED Final   Salmonella species NOT DETECTED NOT DETECTED Final   Yersinia enterocolitica NOT DETECTED NOT DETECTED Final   Vibrio species NOT DETECTED NOT DETECTED Final   Vibrio cholerae NOT DETECTED NOT DETECTED Final    Enteroaggregative E coli (EAEC) NOT DETECTED NOT DETECTED Final   Enteropathogenic E coli (EPEC) NOT DETECTED NOT DETECTED Final   Enterotoxigenic E coli (ETEC) NOT DETECTED NOT DETECTED Final   Shiga like toxin producing E coli (STEC) NOT DETECTED NOT DETECTED Final   Shigella/Enteroinvasive E coli (EIEC) NOT DETECTED NOT DETECTED Final   Cryptosporidium NOT DETECTED NOT DETECTED Final   Cyclospora cayetanensis NOT DETECTED NOT DETECTED Final   Entamoeba histolytica NOT DETECTED NOT DETECTED Final   Giardia lamblia NOT DETECTED NOT DETECTED Final   Adenovirus F40/41 NOT DETECTED NOT DETECTED Final   Astrovirus NOT DETECTED NOT DETECTED Final   Norovirus GI/GII NOT DETECTED NOT DETECTED Final   Rotavirus A NOT DETECTED NOT DETECTED Final   Sapovirus (I, II, IV, and V) NOT DETECTED NOT DETECTED Final    Comment: Performed at [facility], [address] [place], [place] [address]  MRSA Next Gen by PCR, Nasal     Status: None   Collection Time: 06/10/22  8:05 PM   Specimen: Nasal Mucosa; Nasal Swab  Result Value Ref Range Status   MRSA by PCR Next Gen NOT DETECTED NOT DETECTED Final    Comment: (NOTE) The GeneXpert MRSA Assay (FDA approved for NASAL specimens only), is one component of a comprehensive MRSA colonization surveillance program. It is not intended to diagnose MRSA infection nor to guide or monitor treatment for MRSA infections. Test performance is not FDA approved in patients less than 77 years old. Performed at [facility], [address] [place], [place] [address]     Labs: CBC: Recent Labs  Lab 06/09/22 1215 06/10/22 0256 06/10/22 1328 06/10/22 2121 06/11/22 0544 06/11/22 1454 06/12/22 0610 06/13/22 0602  WBC 10.0 10.9*  --   --  21.1*  --  15.2* 15.2*  HGB 14.6 13.0   < > 10.0* 9.2* 9.5* 8.1* 8.5*  HCT 46.1 40.7   < > 30.7* 27.9* 29.1* 25.0* 26.7*  MCV 91.3 89.6  --   --  89.1  --  89.0 91.1  PLT 269 252  --   --  266  --  212 262    < > = values in this interval not displayed.   Basic Metabolic Panel: Recent Labs  Lab 06/09/22 0155 06/10/22 0256 06/11/22 0544 06/12/22 0610 06/13/22 0602  NA 139 135 136 137 136  K 3.9 3.3* 4.1 3.6 3.3*  CL 107 102 105 105 103  CO2 '24 26 24 26 25  '$ GLUCOSE 104* 111* 145* 110* 100*  BUN '17 13 17 12 10  '$ CREATININE 0.88 0.68 0.85 0.75 0.83  CALCIUM 9.6 8.2* 8.1* 7.9* 8.0*  MG  --  1.8 1.9 1.8 2.1   Liver Function Tests: Recent Labs  Lab 06/08/22 2001  AST 26  ALT 24  ALKPHOS 84  BILITOT 0.7  PROT 7.0  ALBUMIN 3.7   CBG: Recent Labs  Lab 06/10/22 2015  GLUCAP 149*    Discharge time spent: greater than 30 minutes.  Signed: Sharen Hones, MD [facility] 06/13/2022

## 2022-06-14 ENCOUNTER — Telehealth: Payer: Self-pay

## 2022-06-14 LAB — HOMOCYSTEINE: Homocysteine: 13.5 umol/L (ref 0.0–14.5)

## 2022-06-14 NOTE — Telephone Encounter (Signed)
Transition Care Management Follow-up Telephone Call Date of discharge and from where: [place] 06/13/2022 How have you been since you were released from the hospital? better Any questions or concerns? No  Items Reviewed: Did the pt receive and understand the discharge instructions provided? Yes  Medications obtained and verified? Yes  Other? No  Any new allergies since your discharge? No  Dietary orders reviewed? Yes Do you have support at home? Yes   Home Care and Equipment/Supplies: Were home health services ordered? no If so, what is the name of the agency? N/a  Has the agency set up a time to come to the patient's home? no Were any new equipment or medical supplies ordered?  No What is the name of the medical supply agency? N/a Were you able to get the supplies/equipment? not applicable Do you have any questions related to the use of the equipment or supplies? No  Functional Questionnaire: (I = Independent and D = Dependent) ADLs: I  Bathing/Dressing- I  Meal Prep- I  Eating- I  Maintaining continence- I  Transferring/Ambulation- I  Managing Meds- I  Follow up appointments reviewed:  PCP Hospital f/u appt confirmed? Yes  Scheduled to see Dr Danise Mina on 06/19/2022 @ 9:30. [facility] f/u appt confirmed? Yes  Scheduled to see Dr [name] on 10/08/2022 @ 7:30. Are transportation arrangements needed? No  If their condition worsens, is the pt aware to call PCP or go to the Emergency Dept.? Yes Was the patient provided with contact information for the PCP's office or ED? Yes Was to pt encouraged to call back with questions or concerns? Yes Juanda Crumble, LPN [facility] [phone]

## 2022-06-19 ENCOUNTER — Encounter: Payer: Self-pay | Admitting: Family Medicine

## 2022-06-19 ENCOUNTER — Ambulatory Visit: Payer: 59 | Admitting: Family Medicine

## 2022-06-19 VITALS — BP 132/76 | HR 100 | Temp 97.5°F | Ht 66.0 in | Wt 280.4 lb

## 2022-06-19 DIAGNOSIS — E538 Deficiency of other specified B group vitamins: Secondary | ICD-10-CM

## 2022-06-19 DIAGNOSIS — K922 Gastrointestinal hemorrhage, unspecified: Secondary | ICD-10-CM | POA: Diagnosis not present

## 2022-06-19 DIAGNOSIS — D62 Acute posthemorrhagic anemia: Secondary | ICD-10-CM | POA: Diagnosis not present

## 2022-06-19 DIAGNOSIS — I10 Essential (primary) hypertension: Secondary | ICD-10-CM

## 2022-06-19 DIAGNOSIS — E1169 Type 2 diabetes mellitus with other specified complication: Secondary | ICD-10-CM

## 2022-06-19 DIAGNOSIS — D509 Iron deficiency anemia, unspecified: Secondary | ICD-10-CM

## 2022-06-19 DIAGNOSIS — J351 Hypertrophy of tonsils: Secondary | ICD-10-CM

## 2022-06-19 LAB — CBC WITH DIFFERENTIAL/PLATELET
Basophils Absolute: 0.1 10*3/uL (ref 0.0–0.1)
Basophils Relative: 0.9 % (ref 0.0–3.0)
Eosinophils Absolute: 0.3 10*3/uL (ref 0.0–0.7)
Eosinophils Relative: 3.1 % (ref 0.0–5.0)
HCT: 32.7 % — ABNORMAL LOW (ref 39.0–52.0)
Hemoglobin: 10.4 g/dL — ABNORMAL LOW (ref 13.0–17.0)
Lymphocytes Relative: 10.1 % — ABNORMAL LOW (ref 12.0–46.0)
Lymphs Abs: 1.1 10*3/uL (ref 0.7–4.0)
MCHC: 31.7 g/dL (ref 30.0–36.0)
MCV: 92.9 fl (ref 78.0–100.0)
Monocytes Absolute: 1.3 10*3/uL — ABNORMAL HIGH (ref 0.1–1.0)
Monocytes Relative: 11.8 % (ref 3.0–12.0)
Neutro Abs: 8.2 10*3/uL — ABNORMAL HIGH (ref 1.4–7.7)
Neutrophils Relative %: 74.1 % (ref 43.0–77.0)
Platelets: 483 10*3/uL — ABNORMAL HIGH (ref 150.0–400.0)
RBC: 3.52 Mil/uL — ABNORMAL LOW (ref 4.22–5.81)
RDW: 19.1 % — ABNORMAL HIGH (ref 11.5–15.5)
WBC: 11 10*3/uL — ABNORMAL HIGH (ref 4.0–10.5)

## 2022-06-19 LAB — BASIC METABOLIC PANEL
BUN: 12 mg/dL (ref 6–23)
CO2: 30 mEq/L (ref 19–32)
Calcium: 8.9 mg/dL (ref 8.4–10.5)
Chloride: 105 mEq/L (ref 96–112)
Creatinine, Ser: 0.77 mg/dL (ref 0.40–1.50)
GFR: 108.08 mL/min (ref >60.00)
Glucose, Bld: 132 mg/dL — ABNORMAL HIGH (ref 70–99)
Potassium: 4.3 mEq/L (ref 3.5–5.1)
Sodium: 139 mEq/L (ref 135–145)

## 2022-06-19 NOTE — Patient Instructions (Addendum)
Labs today  Start b12 547mg daily.  Continue oral iron daily.  Continue amlodipine and Toprol XL for now, monitor blood pressures and let me know if >140/90.  New diabetes range A1c while in the hospital - work on low sugar low carb diet, work on weight loss to help control diabetes. Handout provided today. Let me know if interested in referral for diabetes classes.  We will refer you to general surgery.  Return in 3 months for diabetes follow up visit.

## 2022-06-19 NOTE — Assessment & Plan Note (Signed)
Update labs today 

## 2022-06-19 NOTE — Assessment & Plan Note (Signed)
S/p IV Venofer '300mg'$  infusion x1 as well as blood transfusion 1u pRBC.  He is now on oral iron '325mg'$  daily, tolerating well.

## 2022-06-19 NOTE — Progress Notes (Signed)
Patient ID: JONNIE TRUXILLO, male    DOB: [birth year], 47 y.o.   MRN: [id number]  This visit was conducted in person.  BP 132/76   Pulse 100   Temp (!) 97.5 F (36.4 C) (Temporal)   Ht '5\' 6"'$  (1.676 m)   Wt 280 lb 6 oz (127.2 kg)   SpO2 97%   BMI 45.25 kg/m    CC: hosp f/u visit  Subjective:   HPI: TAMARION HAYMOND is a 47 y.o. male presenting on 06/19/2022 for Hospitalization Follow-up (Admitted on 06/08/22 at [facility], dx lower GI bleed; enteritis. )   Recent hospitalization for rectal bleed. Found to have lower GI bleed from splenic flexure. S/p colonoscopy 06/11/2022, no additional bleeding was seen. VVS ([facility]) evaluated, did not recommend further treatment. Hospital records reviewed. Med rec performed.  He received 1u pRBC. He received IV iron (venofer '300mg'$ ) He received 2 shots of B12 vitamin.  Recommend gen surgery referral in outpatient setting.  Homocysteine level returned normal.  GI pathogen panel returned normal.   He is now on ferrous sulfate '325mg'$  daily. Not taking oral b12.   Colonoscopy 06/11/2022 - 2 small polyps not removed, diverticulosis, healthy anastomosis, non-bleeding int hem [name])  H/o laparoscopic sigmoid colectomy for recurrent diverticulitis (2017 Dr Adonis Huguenin).   ARB/hctz on hold to resume in outpatient setting if needed. He continues amlodipine '5mg'$ dialy as well as metoprolol succinate '25mg'$  daily.   Found to have elevated A1c to 6.8% - newly diabetic. Planning to start no sugar diet.   Since home feeling tired, notes exertional dyspnea, otherwise feels ok.   Home health not set up.  Other follow up appointments scheduled: Dr [name] GI 10/08/2022 ______________________________________________________________________ Hospital admission: 06/08/2022 Hospital discharge: 06/13/2022 TCM f/u phone call:  performed 06/14/2022  Recommendations at discharge:  Follow-up with PCP in 1 week. Refer to general surgery in [facility] by PCP. Recheck a CBC, BMP at  next visit. Follow-up homocystine level, refill B12 if homocystine is elevated.   Discharge Diagnoses: Principal Problem:   Lower GI bleed Active Problems:   Anxiety   Primary hypertension   Obesity, morbid, BMI 40.0-49.9 (HCC)   Dyslipidemia   Mood disorder (HCC)   Hypokalemia   Enteritis of infectious origin   GI bleed   Diverticulosis of intestine with bleeding   Acute blood loss anemia   Iron deficiency anemia     Relevant past medical, surgical, family and social history reviewed and updated as indicated. Interim medical history since our last visit reviewed. Allergies and medications reviewed and updated. Outpatient Medications Prior to Visit  Medication Sig Dispense Refill   amLODipine (NORVASC) 5 MG tablet Take 1 tablet (5 mg total) by mouth daily. 90 tablet 1   budesonide (PULMICORT FLEXHALER) 180 MCG/ACT inhaler Inhale 1 puff into the lungs in the morning and at bedtime. 1 each 0   buPROPion (WELLBUTRIN XL) 150 MG 24 hr tablet Take 1 tablet (150 mg total) by mouth daily. 90 tablet 1   ferrous sulfate 325 (65 FE) MG tablet Take 1 tablet (325 mg total) by mouth daily. 30 tablet 0   metoprolol succinate (TOPROL-XL) 25 MG 24 hr tablet Take 1 tablet (25 mg total) by mouth daily. 90 tablet 1   senna-docusate (SENOKOT-S) 8.6-50 MG tablet Take 1 tablet by mouth 2 (two) times daily. 100 tablet 0   valACYclovir (VALTREX) 500 MG tablet Take 1 tablet (500 mg total) by mouth daily. 90 tablet 1   LORazepam (ATIVAN) 0.5  MG tablet TAKE 1 TABLET (0.5 MG TOTAL) BY MOUTH AT BEDTIME AS NEEDED FOR ANXIETY OR SLEEP. (Patient not taking: Reported on 06/19/2022) 30 tablet 0   albuterol (PROVENTIL) (2.5 MG/3ML) 0.083% nebulizer solution Take 3 mLs (2.5 mg total) by nebulization every 6 (six) hours as needed for wheezing or shortness of breath. (Patient not taking: Reported on 02/25/2022) 75 mL 0   albuterol (VENTOLIN HFA) 108 (90 Base) MCG/ACT inhaler Inhale 2 puffs into the lungs every 6 (six) hours  as needed for wheezing or shortness of breath. (Patient not taking: Reported on 02/25/2022) 1 each 0   cyanocobalamin (VITAMIN B12) 1000 MCG tablet Take 1 tablet (1,000 mcg total) by mouth daily for 14 days. 14 tablet 0   cyclobenzaprine (FLEXERIL) 5 MG tablet Take 1 tablet (5 mg total) by mouth 2 (two) times daily as needed (headache). 30 tablet 0   Nebulizers (COMPRESSOR/NEBULIZER) MISC 1 Units by Does not apply route every 4 (four) hours as needed. 1 each 0   No facility-administered medications prior to visit.     Per HPI unless specifically indicated in ROS section below Review of Systems  Objective:  BP 132/76   Pulse 100   Temp (!) 97.5 F (36.4 C) (Temporal)   Ht '5\' 6"'$  (1.676 m)   Wt 280 lb 6 oz (127.2 kg)   SpO2 97%   BMI 45.25 kg/m   Wt Readings from Last 3 Encounters:  06/19/22 280 lb 6 oz (127.2 kg)  06/10/22 241 lb 2.9 oz (109.4 kg)  02/25/22 284 lb 2 oz (128.9 kg)      Physical Exam Vitals and nursing note reviewed.  Constitutional:      Appearance: Normal appearance. He is not ill-appearing.  HENT:     Mouth/Throat:     Mouth: Mucous membranes are moist.     Pharynx: Oropharynx is clear. No oropharyngeal exudate or posterior oropharyngeal erythema.  Eyes:     Extraocular Movements: Extraocular movements intact.     Pupils: Pupils are equal, round, and reactive to light.  Cardiovascular:     Rate and Rhythm: Normal rate and regular rhythm.     Pulses: Normal pulses.     Heart sounds: Normal heart sounds. No murmur heard. Pulmonary:     Effort: Pulmonary effort is normal. No respiratory distress.     Breath sounds: Normal breath sounds. No wheezing, rhonchi or rales.  Abdominal:     General: Bowel sounds are normal. There is no distension.     Palpations: Abdomen is soft. There is no mass.     Tenderness: There is no abdominal tenderness. There is no guarding or rebound. Negative signs include Murphy's sign.     Hernia: No hernia is present.   Musculoskeletal:     Right lower leg: No edema.     Left lower leg: No edema.  Skin:    General: Skin is warm and dry.     Findings: No rash.  Neurological:     Mental Status: He is alert.  Psychiatric:        Mood and Affect: Mood normal.        Behavior: Behavior normal.       Results for orders placed or performed in visit on 53/66/44  Basic metabolic panel  Result Value Ref Range   Sodium 139 135 - 145 mEq/L   Potassium 4.3 3.5 - 5.1 mEq/L   Chloride 105 96 - 112 mEq/L   CO2 30 19 - 32 mEq/L  Glucose, Bld 132 (H) 70 - 99 mg/dL   BUN 12 6 - 23 mg/dL   Creatinine, Ser 0.77 0.40 - 1.50 mg/dL   GFR 108.08 >60.00 mL/min   Calcium 8.9 8.4 - 10.5 mg/dL  CBC with Differential/Platelet  Result Value Ref Range   WBC 11.0 (H) 4.0 - 10.5 K/uL   RBC 3.52 (L) 4.22 - 5.81 Mil/uL   Hemoglobin 10.4 (L) 13.0 - 17.0 g/dL   HCT 32.7 (L) 39.0 - 52.0 %   MCV 92.9 78.0 - 100.0 fl   MCHC 31.7 30.0 - 36.0 g/dL   RDW 19.1 (H) 11.5 - 15.5 %   Platelets 483.0 (H) 150.0 - 400.0 K/uL   Neutrophils Relative % 74.1 43.0 - 77.0 %   Lymphocytes Relative 10.1 (L) 12.0 - 46.0 %   Monocytes Relative 11.8 3.0 - 12.0 %   Eosinophils Relative 3.1 0.0 - 5.0 %   Basophils Relative 0.9 0.0 - 3.0 %   Neutro Abs 8.2 (H) 1.4 - 7.7 K/uL   Lymphs Abs 1.1 0.7 - 4.0 K/uL   Monocytes Absolute 1.3 (H) 0.1 - 1.0 K/uL   Eosinophils Absolute 0.3 0.0 - 0.7 K/uL   Basophils Absolute 0.1 0.0 - 0.1 K/uL    Lab Results  Component Value Date   IRON 36 (L) 06/12/2022   TIBC 329 06/12/2022   FERRITIN 30 06/12/2022   Lab Results  Component Value Date   HGBA1C 6.8 (H) 06/09/2022    Lab Results  Component Value Date   VITAMINB12 217 06/12/2022       06/19/2022    9:26 AM 01/24/2021    8:11 AM 09/07/2019   11:07 AM 06/08/2019    3:11 PM 11/04/2017   11:36 AM  Depression screen PHQ 2/9  Decreased Interest 0 0 0 0 0  Down, Depressed, Hopeless 0 0 3 3 0  PHQ - 2 Score 0 0 3 3 0  Altered sleeping   0 0   Tired,  decreased energy   0 0   Change in appetite   0 3   Feeling bad or failure about yourself    3 3   Trouble concentrating   0 1   Moving slowly or fidgety/restless   0 0   Suicidal thoughts   1 1   PHQ-9 Score   7 11   Difficult doing work/chores    Not difficult at all     Assessment & Plan:   Problem List Items Addressed This Visit     Primary hypertension    Chronic, stable on amlodipine '5mg'$  daily and Toprol XL '25mg'$  daily.  He was previously on valsartan hctz 160/'25mg'$  daily however this was held in setting of acute blood loss, and BP remains stable off this medication. Will stay off at this time, I did ask him to continue monitoring BP at home and notify me if consistently trending >140/90.       Obesity, morbid, BMI 40.0-49.9 ([place])    Discussed benefits of weight loss for diabetes control.       Type 2 diabetes mellitus with other specified complication ([place])    Reviewed A1c trend with latest reading in diet controlled diabetes range. Reviewed pathophysiology of type 1 vs 2 diabetes as well as management strategies. Encouraged weight loss efforts - he is motivated to start diet. Offered referral to diabetes education classes, declined. Diabetes and nutrition handout provided. RTC 3 mo DM f/u visit.       Enlargement  of right palatine tonsil    Chronic, present since 2021      Lower GI bleed - Primary    Recent lower GI bleed with significant resultant ABLA.  Monitored in ICU for several days, s/p colonoscopy showing splenic flexure bleed.  Stabilized without need for intervention.  Requests referral to gen surgery to discuss elective colectomy given h/o recurrent life threatening GI bleed - referred.       Relevant Orders   Basic metabolic panel (Completed)   CBC with Differential/Platelet (Completed)   Ambulatory referral to General Surgery   Acute blood loss anemia    Update labs today.       Relevant Medications   cyanocobalamin (VITAMIN B12) 500 MCG tablet   Iron  deficiency anemia    S/p IV Venofer '300mg'$  infusion x1 as well as blood transfusion 1u pRBC.  He is now on oral iron '325mg'$  daily, tolerating well.      Relevant Medications   cyanocobalamin (VITAMIN B12) 500 MCG tablet   Vitamin B12 deficiency    B12 levels low at 217, however HC levels normal.  Did recommend he start OTC oral 563mg vit B12 daily.        Meds ordered this encounter  Medications   cyanocobalamin (VITAMIN B12) 500 MCG tablet    Sig: Take 1 tablet (500 mcg total) by mouth daily.    Orders Placed This Encounter  Procedures   Basic metabolic panel   CBC with Differential/Platelet   Ambulatory referral to General Surgery    Referral Priority:   Routine    Referral Type:   Surgical    Referral Reason:   Specialty Services Required    Requested Specialty:   General Surgery    Number of Visits Requested:   1    Patient Instructions  Labs today  Start b12 5065m daily.  Continue oral iron daily.  Continue amlodipine and Toprol XL for now, monitor blood pressures and let me know if >140/90.  New diabetes range A1c while in the hospital - work on low sugar low carb diet, work on weight loss to help control diabetes. Handout provided today. Let me know if interested in referral for diabetes classes.  We will refer you to general surgery.  Return in 3 months for diabetes follow up visit.   Follow up plan: Return in about 3 months (around 09/18/2022), or if symptoms worsen or fail to improve, for follow up visit.  JaRia BushMD

## 2022-06-20 ENCOUNTER — Encounter: Payer: Self-pay | Admitting: *Deleted

## 2022-06-20 ENCOUNTER — Encounter: Payer: Self-pay | Admitting: Family Medicine

## 2022-06-20 ENCOUNTER — Telehealth: Payer: Self-pay

## 2022-06-20 DIAGNOSIS — E538 Deficiency of other specified B group vitamins: Secondary | ICD-10-CM | POA: Insufficient documentation

## 2022-06-20 MED ORDER — CYANOCOBALAMIN 500 MCG PO TABS: 500.0000 ug | ORAL_TABLET | Freq: Every day | ORAL | Status: DC

## 2022-06-20 NOTE — Assessment & Plan Note (Addendum)
Recent lower GI bleed with significant resultant ABLA.  Monitored in ICU for several days, s/p colonoscopy showing splenic flexure bleed.  Stabilized without need for intervention.  Requests referral to gen surgery to discuss elective colectomy given h/o recurrent life threatening GI bleed - referred.

## 2022-06-20 NOTE — Assessment & Plan Note (Signed)
B12 levels low at 217, however HC levels normal.  Did recommend he start OTC oral 526mg vit B12 daily.

## 2022-06-20 NOTE — Assessment & Plan Note (Signed)
Chronic, present since 2021

## 2022-06-20 NOTE — Assessment & Plan Note (Signed)
Chronic, stable on amlodipine '5mg'$  daily and Toprol XL '25mg'$  daily.  He was previously on valsartan hctz 160/'25mg'$  daily however this was held in setting of acute blood loss, and BP remains stable off this medication. Will stay off at this time, I did ask him to continue monitoring BP at home and notify me if consistently trending >140/90.

## 2022-06-20 NOTE — Assessment & Plan Note (Signed)
Discussed benefits of weight loss for diabetes control.

## 2022-06-20 NOTE — Telephone Encounter (Signed)
Lvm asking pt to call back. Need to relay lab results and Dr. Synthia Innocent message.   Labs/Dr. Synthia Innocent msg: Your kidneys and electrolytes returned normal. Your sugar was elevated if it was a fasting reading.   Your blood counts continue slowly improving with Hemoglobin up from 8.5 to 10.4 - continue oral iron, start b12 as discussed. Will await Gen Surgery evaluation.

## 2022-06-20 NOTE — Assessment & Plan Note (Signed)
Reviewed A1c trend with latest reading in diet controlled diabetes range. Reviewed pathophysiology of type 1 vs 2 diabetes as well as management strategies. Encouraged weight loss efforts - he is motivated to start diet. Offered referral to diabetes education classes, declined. Diabetes and nutrition handout provided. RTC 3 mo DM f/u visit.

## 2022-06-21 NOTE — Telephone Encounter (Signed)
Patient notified as instructed by telephone and verbalized understanding. Patient stated that he was not fasting at the time.

## 2022-07-02 ENCOUNTER — Ambulatory Visit: Payer: Self-pay | Admitting: Surgery

## 2022-07-02 DIAGNOSIS — E119 Type 2 diabetes mellitus without complications: Secondary | ICD-10-CM | POA: Insufficient documentation

## 2022-07-02 DIAGNOSIS — K430 Incisional hernia with obstruction, without gangrene: Secondary | ICD-10-CM | POA: Diagnosis present

## 2022-07-02 DIAGNOSIS — K922 Gastrointestinal hemorrhage, unspecified: Secondary | ICD-10-CM | POA: Insufficient documentation

## 2022-07-02 DIAGNOSIS — K5730 Diverticulosis of large intestine without perforation or abscess without bleeding: Secondary | ICD-10-CM | POA: Diagnosis present

## 2022-07-02 DIAGNOSIS — Z7289 Other problems related to lifestyle: Secondary | ICD-10-CM | POA: Insufficient documentation

## 2022-07-04 HISTORY — PX: COLON RESECTION: SHX5231

## 2022-07-08 ENCOUNTER — Other Ambulatory Visit: Payer: Self-pay | Admitting: Urology

## 2022-07-08 NOTE — Progress Notes (Signed)
Anesthesia Review:  PCP: Cardiologist : Chest x-ray :\ CT Angio- 06/08/22 - for  GI bleed  EKG : Echo : Stress test: Cardiac Cath :  Activity level:  Sleep Study/ CPAP : Fasting Blood Sugar :      / Checks Blood Sugar -- times a day:   Blood Thinner/ Instructions /Last Dose: ASA / Instructions/ Last Dose :    06/19/22- cbc, diff, and bmp 06/08/22- ED to Admit for Lower GI bleed

## 2022-07-09 NOTE — Patient Instructions (Signed)
SURGICAL WAITING ROOM VISITATION  Patients having surgery or a procedure may have no more than 2 support people in the waiting area - these visitors may rotate.    Children under the age of 35 must have an adult with them who is not the patient.  Due to an increase in RSV and influenza rates and associated hospitalizations, children ages 59 and under may not visit patients in [place].  If the patient needs to stay at the hospital during part of their recovery, the visitor guidelines for inpatient rooms apply. Pre-op nurse will coordinate an appropriate time for 1 support person to accompany patient in pre-op.  This support person may not rotate.    Please refer to the [facility] website for the visitor guidelines for Inpatients (after your surgery is over and you are in a regular room).       Your procedure is scheduled on:  07/18/2022    Report to [facility] Main Entrance    Report to admitting at  [place] AM   Call this number if you have problems the morning of surgery [phone]   Do not eat food :After Midnight.   After Midnight you may have the following liquids until __ 0430____ AM DAY OF SURGERY  Water Non-Citrus Juices (without pulp, NO RED-Apple, White grape, White cranberry) Black Coffee (NO MILK/CREAM OR CREAMERS, sugar ok)  Clear Tea (NO MILK/CREAM OR CREAMERS, sugar ok) regular and decaf                             Plain Jell-O (NO RED)                                           Fruit ices (not with fruit pulp, NO RED)                                     Popsicles (NO RED)                                                               Sports drinks like Gatorade (NO RED)              Drink 2 Ensure/G2 drinks AT 10:00 PM the night before surgery.        The day of surgery:  Drink ONE (1) Pre-Surgery Clear Ensure or G2 at  0430AM ( have completed by )  the morning of surgery. Drink in one sitting. Do not sip.  This drink was given to you  during your hospital  pre-op appointment visit. Nothing else to drink after completing the  Pre-Surgery Clear Ensure or G2.          If you have questions, please contact your surgeon's office.   FOLLOW BOWEL PREP AND ANY ADDITIONAL PRE OP INSTRUCTIONS YOU RECEIVED FROM YOUR SURGEON'S OFFICE!!!     Oral Hygiene is also important to reduce your risk of infection.  Remember - BRUSH YOUR TEETH THE MORNING OF SURGERY WITH YOUR REGULAR TOOTHPASTE  DENTURES WILL BE REMOVED PRIOR TO SURGERY PLEASE DO NOT APPLY "Poly grip" OR ADHESIVES!!!   Do NOT smoke after Midnight   Take these medicines the morning of surgery with A SIP OF WATER:  amlodipine, inhalers as usual and bring, wellbutrin, toprol   DO NOT TAKE ANY ORAL DIABETIC MEDICATIONS DAY OF YOUR SURGERY  Bring CPAP mask and tubing day of surgery.                              You may not have any metal on your body including hair pins, jewelry, and body piercing             Do not wear make-up, lotions, powders, perfumes/cologne, or deodorant  Do not wear nail polish including gel and S&S, artificial/acrylic nails, or any other type of covering on natural nails including finger and toenails. If you have artificial nails, gel coating, etc. that needs to be removed by a nail salon please have this removed prior to surgery or surgery may need to be canceled/ delayed if the surgeon/ anesthesia feels like they are unable to be safely monitored.   Do not shave  48 hours prior to surgery.               Men may shave face and neck.   Do not bring valuables to the hospital. [place].   Contacts, glasses, dentures or bridgework may not be worn into surgery.   Bring small overnight bag day of surgery.   DO NOT [place]. PHARMACY WILL DISPENSE MEDICATIONS LISTED ON YOUR MEDICATION LIST TO YOU DURING YOUR ADMISSION [place]!    Patients discharged on the day of surgery will not be allowed to drive home.  Someone NEEDS to stay with you for the first 24 hours after anesthesia.   Special Instructions: Bring a copy of your healthcare power of attorney and living will documents the day of surgery if you haven't scanned them before.              Please read over the following fact sheets you were given: IF [place] [phone]   If you received a COVID test during your pre-op visit  it is requested that you wear a mask when out in public, stay away from anyone that may not be feeling well and notify your surgeon if you develop symptoms. If you test positive for Covid or have been in contact with anyone that has tested positive in the last 10 days please notify you surgeon.    [place] - Preparing for Surgery Before surgery, you can play an important role.  Because skin is not sterile, your skin needs to be as free of germs as possible.  You can reduce the number of germs on your skin by washing with CHG (chlorahexidine gluconate) soap before surgery.  CHG is an antiseptic cleaner which kills germs and bonds with the skin to continue killing germs even after washing. Please DO NOT use if you have an allergy to CHG or antibacterial soaps.  If your skin becomes reddened/irritated stop using the CHG and inform your nurse when you arrive at Short Stay. Do  not shave (including legs and underarms) for at least 48 hours prior to the first CHG shower.  You may shave your face/neck. Please follow these instructions carefully:  1.  Shower with CHG Soap the night before surgery and the  morning of Surgery.  2.  If you choose to wash your hair, wash your hair first as usual with your  normal  shampoo.  3.  After you shampoo, rinse your hair and body thoroughly to remove the  shampoo.                           4.  Use CHG as you would any other liquid soap.  You can apply  chg directly  to the skin and wash                       Gently with a scrungie or clean washcloth.  5.  Apply the CHG Soap to your body ONLY FROM THE NECK DOWN.   Do not use on face/ open                           Wound or open sores. Avoid contact with eyes, ears mouth and genitals (private parts).                       Wash face,  Genitals (private parts) with your normal soap.             6.  Wash thoroughly, paying special attention to the area where your surgery  will be performed.  7.  Thoroughly rinse your body with warm water from the neck down.  8.  DO NOT shower/wash with your normal soap after using and rinsing off  the CHG Soap.                9.  Pat yourself dry with a clean towel.            10.  Wear clean pajamas.            11.  Place clean sheets on your bed the night of your first shower and do not  sleep with pets. Day of Surgery : Do not apply any lotions/deodorants the morning of surgery.  Please wear clean clothes to the hospital/surgery center.  FAILURE TO FOLLOW THESE INSTRUCTIONS MAY RESULT IN THE CANCELLATION OF YOUR SURGERY PATIENT SIGNATURE_________________________________  NURSE SIGNATURE__________________________________  ________________________________________________________________________

## 2022-07-10 ENCOUNTER — Encounter (HOSPITAL_COMMUNITY): Payer: Self-pay

## 2022-07-10 ENCOUNTER — Other Ambulatory Visit: Payer: Self-pay

## 2022-07-10 ENCOUNTER — Encounter (HOSPITAL_COMMUNITY)
Admission: RE | Admit: 2022-07-10 | Discharge: 2022-07-10 | Disposition: A | Discharge status: Home / Self Care | Payer: 59 | Source: Ambulatory Visit | Attending: Surgery | Admitting: Surgery

## 2022-07-10 VITALS — BP 153/92 | HR 87 | Temp 97.8°F | Resp 16 | Ht 67.25 in

## 2022-07-10 DIAGNOSIS — Z01818 Encounter for other preprocedural examination: Secondary | ICD-10-CM | POA: Insufficient documentation

## 2022-07-10 HISTORY — DX: Type 2 diabetes mellitus without complications: E11.9

## 2022-07-10 HISTORY — DX: Depression, unspecified: F32.A

## 2022-07-10 LAB — BASIC METABOLIC PANEL
Anion gap: 8 (ref 5–15)
BUN: 18 mg/dL (ref 6–20)
CO2: 25 mmol/L (ref 22–32)
Calcium: 9.3 mg/dL (ref 8.9–10.3)
Chloride: 103 mmol/L (ref 98–111)
Creatinine, Ser: 0.8 mg/dL (ref 0.61–1.24)
GFR, Estimated: >60 mL/min (ref >60)
Glucose, Bld: 103 mg/dL — ABNORMAL HIGH (ref 70–99)
Potassium: 4.2 mmol/L (ref 3.5–5.1)
Sodium: 136 mmol/L (ref 135–145)

## 2022-07-10 LAB — CBC
HCT: 42 % (ref 39.0–52.0)
Hemoglobin: 12.8 g/dL — ABNORMAL LOW (ref 13.0–17.0)
MCH: 27.6 pg (ref 26.0–34.0)
MCHC: 30.5 g/dL (ref 30.0–36.0)
MCV: 90.5 fL (ref 80.0–100.0)
Platelets: 422 10*3/uL — ABNORMAL HIGH (ref 150–400)
RBC: 4.64 MIL/uL (ref 4.22–5.81)
RDW: 15.9 % — ABNORMAL HIGH (ref 11.5–15.5)
WBC: 10.7 10*3/uL — ABNORMAL HIGH (ref 4.0–10.5)
nRBC: 0 % (ref 0.0–0.2)

## 2022-07-10 LAB — HEMOGLOBIN A1C
Hgb A1c MFr Bld: 4.8 % (ref 4.8–5.6)
Mean Plasma Glucose: 91.06 mg/dL

## 2022-07-10 LAB — GLUCOSE, CAPILLARY: Glucose-Capillary: 109 mg/dL — ABNORMAL HIGH (ref 70–99)

## 2022-07-10 NOTE — Consult Note (Signed)
[place] Nurse ostomy consult note [place] Nurse requested for preoperative stoma site marking by Dr. Clyda Greener.  Discussed surgical procedure and stoma creation with patient.  Explained role of the [place] nurse team. Answered patient questions.   Examined patient sitting and standing in order to place the marking in the patient's visual field, away from any creases or abdominal contour issues and within the rectus muscle.  Patient wears his slacks well below this natural waist in the subpannicular skin fold. He has a larger body habitus and will have difficulty seeing his stoma for self care if one is placed below the umbilicus in the lower quadrants.  Marked for colostomy in the LUQ  8cm to the left of the umbilicus and 2cm above the umbilicus.  Marked for ileostomy in the RUQ  9cm to the right of the umbilicus and  4.2AS above the umbilicus.  Patient's abdomen cleansed with CHG wipes at site markings, allowed to air dry prior to marking. Covered markings with thin film transparent dressing to preserve mark until date of surgery, which is on Thursday, 07/18/22. Patient is provided with the skin marking pen to reinforce marking if the thin film dressing wears/comes off or if the marking fades. He is reassured that the marking locations are also documented in his medical record for the surgical team's reference.  [place] Nurse team will follow up with patient after surgery for continued ostomy care and teaching.    Thank you for inviting [place] to participate in this patient's Plan of Care.  Maudie Flakes, MSN, RN, CNS, [place], Serita Grammes, [employer], [employer] phone:  [phone]

## 2022-07-15 ENCOUNTER — Encounter: Payer: Self-pay | Admitting: Family Medicine

## 2022-07-17 NOTE — Anesthesia Preprocedure Evaluation (Addendum)
Anesthesia Evaluation  Patient identified by MRN, date of birth, ID band Patient awake    Reviewed: Allergy & Precautions, H&P , NPO status , Patient's Chart, lab work & pertinent test results  Airway Mallampati: II  TM Distance: <3 FB Neck ROM: Full    Dental no notable dental hx.    Pulmonary neg pulmonary ROS   Pulmonary exam normal breath sounds clear to auscultation       Cardiovascular hypertension, Pt. on medications Normal cardiovascular exam Rhythm:Regular Rate:Normal     Neuro/Psych negative neurological ROS  negative psych ROS   GI/Hepatic negative GI ROS, Neg liver ROS,,,  Endo/Other  diabetes  Morbid obesity  Renal/GU negative Renal ROS  negative genitourinary   Musculoskeletal negative musculoskeletal ROS (+)    Abdominal   Peds negative pediatric ROS (+)  Hematology negative hematology ROS (+)   Anesthesia Other Findings   Reproductive/Obstetrics negative OB ROS                             Anesthesia Physical Anesthesia Plan  ASA: 3  Anesthesia Plan: General   Post-op Pain Management: Tylenol PO (pre-op)* and Gabapentin PO (pre-op)*   Induction: Intravenous  PONV Risk Score and Plan: 2 and Ondansetron, Dexamethasone and Treatment may vary due to age or medical condition  Airway Management Planned: Oral ETT  Additional Equipment:   Intra-op Plan:   Post-operative Plan: Extubation in OR  Informed Consent: I have reviewed the patients History and Physical, chart, labs and discussed the procedure including the risks, benefits and alternatives for the proposed anesthesia with the patient or authorized representative who has indicated his/her understanding and acceptance.     Dental advisory given  Plan Discussed with: CRNA and Surgeon  Anesthesia Plan Comments:        Anesthesia Quick Evaluation

## 2022-07-18 ENCOUNTER — Inpatient Hospital Stay (HOSPITAL_COMMUNITY): Payer: 59 | Admitting: Anesthesiology

## 2022-07-18 ENCOUNTER — Encounter (HOSPITAL_COMMUNITY)
Admission: RE | Disposition: A | Discharge status: Home / Self Care | Payer: Self-pay | Source: Home / Self Care | Attending: Surgery

## 2022-07-18 ENCOUNTER — Inpatient Hospital Stay (HOSPITAL_COMMUNITY): Payer: 59 | Admitting: Physician Assistant

## 2022-07-18 ENCOUNTER — Encounter (HOSPITAL_COMMUNITY): Payer: Self-pay | Admitting: Surgery

## 2022-07-18 ENCOUNTER — Inpatient Hospital Stay (HOSPITAL_COMMUNITY): Payer: 59

## 2022-07-18 ENCOUNTER — Inpatient Hospital Stay (HOSPITAL_COMMUNITY)
Admission: RE | Admit: 2022-07-18 | Discharge: 2022-07-22 | DRG: 330 | Disposition: A | Discharge status: Home / Self Care | Payer: 59 | Attending: Surgery | Admitting: Surgery

## 2022-07-18 ENCOUNTER — Other Ambulatory Visit: Payer: Self-pay

## 2022-07-18 ENCOUNTER — Other Ambulatory Visit (HOSPITAL_COMMUNITY): Payer: Self-pay

## 2022-07-18 DIAGNOSIS — Z01818 Encounter for other preprocedural examination: Secondary | ICD-10-CM

## 2022-07-18 DIAGNOSIS — Z6841 Body Mass Index (BMI) 40.0 and over, adult: Secondary | ICD-10-CM | POA: Diagnosis not present

## 2022-07-18 DIAGNOSIS — K922 Gastrointestinal hemorrhage, unspecified: Secondary | ICD-10-CM

## 2022-07-18 DIAGNOSIS — B009 Herpesviral infection, unspecified: Secondary | ICD-10-CM | POA: Diagnosis present

## 2022-07-18 DIAGNOSIS — J302 Other seasonal allergic rhinitis: Secondary | ICD-10-CM | POA: Diagnosis present

## 2022-07-18 DIAGNOSIS — Z8616 Personal history of COVID-19: Secondary | ICD-10-CM | POA: Diagnosis not present

## 2022-07-18 DIAGNOSIS — F39 Unspecified mood [affective] disorder: Secondary | ICD-10-CM | POA: Diagnosis present

## 2022-07-18 DIAGNOSIS — F17290 Nicotine dependence, other tobacco product, uncomplicated: Secondary | ICD-10-CM | POA: Diagnosis present

## 2022-07-18 DIAGNOSIS — K430 Incisional hernia with obstruction, without gangrene: Secondary | ICD-10-CM | POA: Diagnosis present

## 2022-07-18 DIAGNOSIS — K5733 Diverticulitis of large intestine without perforation or abscess with bleeding: Principal | ICD-10-CM | POA: Diagnosis present

## 2022-07-18 DIAGNOSIS — E785 Hyperlipidemia, unspecified: Secondary | ICD-10-CM | POA: Diagnosis present

## 2022-07-18 DIAGNOSIS — E1169 Type 2 diabetes mellitus with other specified complication: Secondary | ICD-10-CM | POA: Diagnosis present

## 2022-07-18 DIAGNOSIS — F32A Depression, unspecified: Secondary | ICD-10-CM | POA: Diagnosis present

## 2022-07-18 DIAGNOSIS — K5730 Diverticulosis of large intestine without perforation or abscess without bleeding: Secondary | ICD-10-CM | POA: Diagnosis present

## 2022-07-18 DIAGNOSIS — K660 Peritoneal adhesions (postprocedural) (postinfection): Secondary | ICD-10-CM | POA: Diagnosis present

## 2022-07-18 DIAGNOSIS — F988 Other specified behavioral and emotional disorders with onset usually occurring in childhood and adolescence: Secondary | ICD-10-CM | POA: Diagnosis present

## 2022-07-18 DIAGNOSIS — E119 Type 2 diabetes mellitus without complications: Secondary | ICD-10-CM | POA: Diagnosis not present

## 2022-07-18 DIAGNOSIS — F419 Anxiety disorder, unspecified: Secondary | ICD-10-CM | POA: Diagnosis present

## 2022-07-18 DIAGNOSIS — Z7984 Long term (current) use of oral hypoglycemic drugs: Secondary | ICD-10-CM | POA: Diagnosis not present

## 2022-07-18 DIAGNOSIS — I10 Essential (primary) hypertension: Secondary | ICD-10-CM | POA: Diagnosis present

## 2022-07-18 DIAGNOSIS — Z79899 Other long term (current) drug therapy: Secondary | ICD-10-CM | POA: Diagnosis not present

## 2022-07-18 HISTORY — PX: PROCTOSCOPY: SHX2266

## 2022-07-18 HISTORY — DX: Diverticulitis of large intestine without perforation or abscess with bleeding: K57.33

## 2022-07-18 LAB — GLUCOSE, CAPILLARY: Glucose-Capillary: 102 mg/dL — ABNORMAL HIGH (ref 70–99)

## 2022-07-18 LAB — TYPE AND SCREEN
ABO/RH(D): O POS
Antibody Screen: NEGATIVE

## 2022-07-18 SURGERY — COLECTOMY, PARTIAL, ROBOT-ASSISTED, LAPAROSCOPIC
Anesthesia: General

## 2022-07-18 MED ORDER — LACTATED RINGERS IV SOLN: INTRAVENOUS | Status: DC | PRN

## 2022-07-18 MED ORDER — SUGAMMADEX SODIUM 500 MG/5ML IV SOLN
INTRAVENOUS | Status: AC
  Filled 2022-07-18: qty 5

## 2022-07-18 MED ORDER — SIMETHICONE 80 MG PO CHEW
40.0000 mg | CHEWABLE_TABLET | Freq: Four times a day (QID) | ORAL | Status: DC | PRN
  Administered 2022-07-20 – 2022-07-21 (×2): 40 mg via ORAL
  Filled 2022-07-18 (×3): qty 1

## 2022-07-18 MED ORDER — MAGIC MOUTHWASH: 15.0000 mL | Freq: Four times a day (QID) | ORAL | Status: DC | PRN

## 2022-07-18 MED ORDER — METOPROLOL SUCCINATE ER 25 MG PO TB24
25.0000 mg | ORAL_TABLET | Freq: Every day | ORAL | Status: DC
  Administered 2022-07-18 – 2022-07-22 (×5): 25 mg via ORAL
  Filled 2022-07-18 (×5): qty 1

## 2022-07-18 MED ORDER — METOPROLOL SUCCINATE ER 25 MG PO TB24
ORAL_TABLET | ORAL | Status: AC
  Administered 2022-07-18: 25 mg via ORAL
  Filled 2022-07-18: qty 1

## 2022-07-18 MED ORDER — IOHEXOL 300 MG/ML  SOLN
INTRAMUSCULAR | Status: DC | PRN
  Administered 2022-07-18: 2.5 mL

## 2022-07-18 MED ORDER — ONDANSETRON HCL 4 MG/2ML IJ SOLN: 4.0000 mg | Freq: Once | INTRAMUSCULAR | Status: DC | PRN

## 2022-07-18 MED ORDER — METRONIDAZOLE 500 MG PO TABS: 1000.0000 mg | ORAL_TABLET | ORAL | Status: DC

## 2022-07-18 MED ORDER — SODIUM CHLORIDE 0.9% FLUSH
3.0000 mL | Freq: Two times a day (BID) | INTRAVENOUS | Status: DC
  Administered 2022-07-19 – 2022-07-21 (×6): 3 mL via INTRAVENOUS

## 2022-07-18 MED ORDER — ENOXAPARIN SODIUM 40 MG/0.4ML IJ SOSY
40.0000 mg | PREFILLED_SYRINGE | INTRAMUSCULAR | Status: DC
  Administered 2022-07-19 – 2022-07-22 (×4): 40 mg via SUBCUTANEOUS
  Filled 2022-07-18 (×4): qty 0.4

## 2022-07-18 MED ORDER — ALUM & MAG HYDROXIDE-SIMETH 200-200-20 MG/5ML PO SUSP: 30.0000 mL | Freq: Four times a day (QID) | ORAL | Status: DC | PRN

## 2022-07-18 MED ORDER — SPY AGENT GREEN - (INDOCYANINE FOR INJECTION)
INTRAMUSCULAR | Status: DC | PRN
  Administered 2022-07-18: 2 mL via INTRAVENOUS

## 2022-07-18 MED ORDER — FENTANYL CITRATE (PF) 100 MCG/2ML IJ SOLN
INTRAMUSCULAR | Status: DC | PRN
  Administered 2022-07-18 (×2): 50 ug via INTRAVENOUS
  Administered 2022-07-18: 100 ug via INTRAVENOUS

## 2022-07-18 MED ORDER — ROCURONIUM BROMIDE 10 MG/ML (PF) SYRINGE
PREFILLED_SYRINGE | INTRAVENOUS | Status: DC | PRN
  Administered 2022-07-18: 20 mg via INTRAVENOUS
  Administered 2022-07-18: 30 mg via INTRAVENOUS
  Administered 2022-07-18: 80 mg via INTRAVENOUS
  Administered 2022-07-18: 40 mg via INTRAVENOUS
  Administered 2022-07-18: 20 mg via INTRAVENOUS

## 2022-07-18 MED ORDER — ENSURE PRE-SURGERY PO LIQD: 296.0000 mL | Freq: Once | ORAL | Status: DC

## 2022-07-18 MED ORDER — HYDROMORPHONE HCL 1 MG/ML IJ SOLN
0.2500 mg | INTRAMUSCULAR | Status: DC | PRN
  Administered 2022-07-18: 0.25 mg via INTRAVENOUS

## 2022-07-18 MED ORDER — VITAMIN B-12 1000 MCG PO TABS
500.0000 ug | ORAL_TABLET | Freq: Every day | ORAL | Status: DC
  Administered 2022-07-18 – 2022-07-22 (×5): 500 ug via ORAL
  Filled 2022-07-18 (×5): qty 1

## 2022-07-18 MED ORDER — METOPROLOL SUCCINATE ER 25 MG PO TB24: 25.0000 mg | ORAL_TABLET | Freq: Once | ORAL | Status: AC

## 2022-07-18 MED ORDER — ONDANSETRON HCL 4 MG/2ML IJ SOLN
INTRAMUSCULAR | Status: DC | PRN
  Administered 2022-07-18: 4 mg via INTRAVENOUS

## 2022-07-18 MED ORDER — PROCHLORPERAZINE EDISYLATE 10 MG/2ML IJ SOLN: 5.0000 mg | Freq: Four times a day (QID) | INTRAMUSCULAR | Status: DC | PRN

## 2022-07-18 MED ORDER — DEXAMETHASONE SODIUM PHOSPHATE 10 MG/ML IJ SOLN
INTRAMUSCULAR | Status: DC | PRN
  Administered 2022-07-18: 8 mg via INTRAVENOUS

## 2022-07-18 MED ORDER — BUPIVACAINE LIPOSOME 1.3 % IJ SUSP
INTRAMUSCULAR | Status: AC
  Filled 2022-07-18: qty 20

## 2022-07-18 MED ORDER — BUPIVACAINE LIPOSOME 1.3 % IJ SUSP: 20.0000 mL | Freq: Once | INTRAMUSCULAR | Status: DC

## 2022-07-18 MED ORDER — FENTANYL CITRATE (PF) 100 MCG/2ML IJ SOLN
INTRAMUSCULAR | Status: AC
  Filled 2022-07-18: qty 2

## 2022-07-18 MED ORDER — KETOROLAC TROMETHAMINE 30 MG/ML IJ SOLN
INTRAMUSCULAR | Status: AC
  Filled 2022-07-18: qty 1

## 2022-07-18 MED ORDER — CHLORHEXIDINE GLUCONATE 0.12 % MT SOLN
15.0000 mL | Freq: Once | OROMUCOSAL | Status: AC
  Administered 2022-07-18: 15 mL via OROMUCOSAL

## 2022-07-18 MED ORDER — PROPOFOL 10 MG/ML IV BOLUS
INTRAVENOUS | Status: AC
  Filled 2022-07-18: qty 20

## 2022-07-18 MED ORDER — BUPROPION HCL ER (XL) 150 MG PO TB24
150.0000 mg | ORAL_TABLET | Freq: Every day | ORAL | Status: DC
  Administered 2022-07-18 – 2022-07-22 (×5): 150 mg via ORAL
  Filled 2022-07-18 (×5): qty 1

## 2022-07-18 MED ORDER — LACTATED RINGERS IR SOLN
Status: DC | PRN
  Administered 2022-07-18: 1000 mL

## 2022-07-18 MED ORDER — MIDAZOLAM HCL 2 MG/2ML IJ SOLN
INTRAMUSCULAR | Status: AC
  Filled 2022-07-18: qty 2

## 2022-07-18 MED ORDER — STERILE WATER FOR INJECTION IJ SOLN
INTRAMUSCULAR | Status: AC
  Filled 2022-07-18: qty 10

## 2022-07-18 MED ORDER — KETOROLAC TROMETHAMINE 30 MG/ML IJ SOLN
30.0000 mg | Freq: Once | INTRAMUSCULAR | Status: AC | PRN
  Administered 2022-07-18: 30 mg via INTRAVENOUS

## 2022-07-18 MED ORDER — ENALAPRILAT 1.25 MG/ML IV SOLN: 0.6250 mg | Freq: Four times a day (QID) | INTRAVENOUS | Status: DC | PRN

## 2022-07-18 MED ORDER — LACTATED RINGERS IV SOLN: INTRAVENOUS | Status: DC

## 2022-07-18 MED ORDER — TRAMADOL HCL 50 MG PO TABS
50.0000 mg | ORAL_TABLET | Freq: Four times a day (QID) | ORAL | 0 refills | Status: DC | PRN
  Filled 2022-07-18: qty 20, 3d supply, fill #0

## 2022-07-18 MED ORDER — DIPHENHYDRAMINE HCL 12.5 MG/5ML PO ELIX: 12.5000 mg | ORAL_SOLUTION | Freq: Four times a day (QID) | ORAL | Status: DC | PRN

## 2022-07-18 MED ORDER — METHOCARBAMOL 500 MG PO TABS
500.0000 mg | ORAL_TABLET | Freq: Three times a day (TID) | ORAL | 1 refills | Status: DC | PRN
  Filled 2022-07-18: qty 20, 7d supply, fill #0

## 2022-07-18 MED ORDER — PHENYLEPHRINE 80 MCG/ML (10ML) SYRINGE FOR IV PUSH (FOR BLOOD PRESSURE SUPPORT)
PREFILLED_SYRINGE | INTRAVENOUS | Status: DC | PRN
  Administered 2022-07-18: 80 ug via INTRAVENOUS
  Administered 2022-07-18 (×3): 120 ug via INTRAVENOUS
  Administered 2022-07-18 (×2): 80 ug via INTRAVENOUS

## 2022-07-18 MED ORDER — ONDANSETRON HCL 4 MG PO TABS: 4.0000 mg | ORAL_TABLET | Freq: Four times a day (QID) | ORAL | Status: DC | PRN

## 2022-07-18 MED ORDER — ORAL CARE MOUTH RINSE: 15.0000 mL | OROMUCOSAL | Status: DC | PRN

## 2022-07-18 MED ORDER — EPHEDRINE SULFATE-NACL 50-0.9 MG/10ML-% IV SOSY
PREFILLED_SYRINGE | INTRAVENOUS | Status: DC | PRN
  Administered 2022-07-18: 10 mg via INTRAVENOUS
  Administered 2022-07-18: 5 mg via INTRAVENOUS

## 2022-07-18 MED ORDER — MENTHOL 3 MG MT LOZG: 1.0000 | LOZENGE | OROMUCOSAL | Status: DC | PRN

## 2022-07-18 MED ORDER — GABAPENTIN 300 MG PO CAPS
300.0000 mg | ORAL_CAPSULE | ORAL | Status: AC
  Administered 2022-07-18: 300 mg via ORAL
  Filled 2022-07-18: qty 1

## 2022-07-18 MED ORDER — OXYCODONE HCL 5 MG/5ML PO SOLN: 5.0000 mg | Freq: Once | ORAL | Status: AC | PRN

## 2022-07-18 MED ORDER — KETAMINE HCL 10 MG/ML IJ SOLN
INTRAMUSCULAR | Status: AC
  Filled 2022-07-18: qty 1

## 2022-07-18 MED ORDER — ONDANSETRON HCL 4 MG/2ML IJ SOLN
4.0000 mg | Freq: Four times a day (QID) | INTRAMUSCULAR | Status: DC | PRN
  Administered 2022-07-18: 4 mg via INTRAVENOUS
  Filled 2022-07-18: qty 2

## 2022-07-18 MED ORDER — HYDROMORPHONE HCL 1 MG/ML IJ SOLN
0.5000 mg | INTRAMUSCULAR | Status: DC | PRN
  Administered 2022-07-18: 0.5 mg via INTRAVENOUS
  Administered 2022-07-19 – 2022-07-20 (×2): 1 mg via INTRAVENOUS
  Filled 2022-07-18 (×3): qty 1

## 2022-07-18 MED ORDER — HYDRALAZINE HCL 20 MG/ML IJ SOLN: 10.0000 mg | INTRAMUSCULAR | Status: DC | PRN

## 2022-07-18 MED ORDER — METHOCARBAMOL 500 MG PO TABS
1000.0000 mg | ORAL_TABLET | Freq: Four times a day (QID) | ORAL | Status: DC | PRN
  Administered 2022-07-18 – 2022-07-21 (×6): 1000 mg via ORAL
  Filled 2022-07-18 (×7): qty 2

## 2022-07-18 MED ORDER — LACTATED RINGERS IV BOLUS: 1000.0000 mL | Freq: Three times a day (TID) | INTRAVENOUS | Status: DC | PRN

## 2022-07-18 MED ORDER — STERILE WATER FOR INJECTION IJ SOLN
INTRAMUSCULAR | Status: DC | PRN
  Administered 2022-07-18: 15 mL via INTRAVESICAL

## 2022-07-18 MED ORDER — ENOXAPARIN SODIUM 40 MG/0.4ML IJ SOSY
40.0000 mg | PREFILLED_SYRINGE | Freq: Once | INTRAMUSCULAR | Status: AC
  Administered 2022-07-18: 40 mg via SUBCUTANEOUS
  Filled 2022-07-18: qty 0.4

## 2022-07-18 MED ORDER — ACETAMINOPHEN 500 MG PO TABS
1000.0000 mg | ORAL_TABLET | ORAL | Status: AC
  Administered 2022-07-18: 1000 mg via ORAL
  Filled 2022-07-18: qty 2

## 2022-07-18 MED ORDER — BUPIVACAINE HCL (PF) 0.25 % IJ SOLN
INTRAMUSCULAR | Status: DC | PRN
  Administered 2022-07-18: 50 mL

## 2022-07-18 MED ORDER — SODIUM CHLORIDE 0.9% FLUSH: 3.0000 mL | INTRAVENOUS | Status: DC | PRN

## 2022-07-18 MED ORDER — 0.9 % SODIUM CHLORIDE (POUR BTL) OPTIME
TOPICAL | Status: DC | PRN
  Administered 2022-07-18: 2000 mL

## 2022-07-18 MED ORDER — ROCURONIUM BROMIDE 10 MG/ML (PF) SYRINGE
PREFILLED_SYRINGE | INTRAVENOUS | Status: AC
  Filled 2022-07-18: qty 10

## 2022-07-18 MED ORDER — PHENYLEPHRINE HCL-NACL 20-0.9 MG/250ML-% IV SOLN
INTRAVENOUS | Status: DC | PRN
  Administered 2022-07-18: 45 ug/min via INTRAVENOUS

## 2022-07-18 MED ORDER — DIPHENHYDRAMINE HCL 50 MG/ML IJ SOLN: 12.5000 mg | Freq: Four times a day (QID) | INTRAMUSCULAR | Status: DC | PRN

## 2022-07-18 MED ORDER — MIDAZOLAM HCL 5 MG/5ML IJ SOLN
INTRAMUSCULAR | Status: DC | PRN
  Administered 2022-07-18: 2 mg via INTRAVENOUS

## 2022-07-18 MED ORDER — METHOCARBAMOL 1000 MG/10ML IJ SOLN: 1000.0000 mg | Freq: Four times a day (QID) | INTRAVENOUS | Status: DC | PRN

## 2022-07-18 MED ORDER — ORAL CARE MOUTH RINSE: 15.0000 mL | Freq: Once | OROMUCOSAL | Status: AC

## 2022-07-18 MED ORDER — OXYCODONE HCL 5 MG PO TABS
ORAL_TABLET | ORAL | Status: AC
  Filled 2022-07-18: qty 1

## 2022-07-18 MED ORDER — ENSURE PRE-SURGERY PO LIQD: 592.0000 mL | Freq: Once | ORAL | Status: DC

## 2022-07-18 MED ORDER — PROCHLORPERAZINE MALEATE 10 MG PO TABS: 10.0000 mg | ORAL_TABLET | Freq: Four times a day (QID) | ORAL | Status: DC | PRN

## 2022-07-18 MED ORDER — PHENOL 1.4 % MT LIQD: 2.0000 | OROMUCOSAL | Status: DC | PRN

## 2022-07-18 MED ORDER — BUPIVACAINE LIPOSOME 1.3 % IJ SUSP
INTRAMUSCULAR | Status: DC | PRN
  Administered 2022-07-18: 20 mL

## 2022-07-18 MED ORDER — SODIUM CHLORIDE 0.9 % IV SOLN
2.0000 g | Freq: Two times a day (BID) | INTRAVENOUS | Status: AC
  Administered 2022-07-18: 2 g via INTRAVENOUS
  Filled 2022-07-18 (×2): qty 2

## 2022-07-18 MED ORDER — VALACYCLOVIR HCL 500 MG PO TABS
500.0000 mg | ORAL_TABLET | Freq: Every day | ORAL | Status: DC
  Administered 2022-07-18 – 2022-07-22 (×5): 500 mg via ORAL
  Filled 2022-07-18 (×5): qty 1

## 2022-07-18 MED ORDER — ENSURE SURGERY PO LIQD
237.0000 mL | Freq: Two times a day (BID) | ORAL | Status: DC
  Administered 2022-07-19 – 2022-07-20 (×3): 237 mL via ORAL

## 2022-07-18 MED ORDER — ACETAMINOPHEN 500 MG PO TABS
1000.0000 mg | ORAL_TABLET | Freq: Four times a day (QID) | ORAL | Status: DC
  Administered 2022-07-18 – 2022-07-22 (×15): 1000 mg via ORAL
  Filled 2022-07-18 (×15): qty 2

## 2022-07-18 MED ORDER — SODIUM CHLORIDE 0.9 % IV SOLN: 250.0000 mL | INTRAVENOUS | Status: DC | PRN

## 2022-07-18 MED ORDER — EPHEDRINE 5 MG/ML INJ
INTRAVENOUS | Status: AC
  Filled 2022-07-18: qty 5

## 2022-07-18 MED ORDER — METOPROLOL TARTRATE 5 MG/5ML IV SOLN: 5.0000 mg | Freq: Four times a day (QID) | INTRAVENOUS | Status: DC | PRN

## 2022-07-18 MED ORDER — ALVIMOPAN 12 MG PO CAPS
12.0000 mg | ORAL_CAPSULE | Freq: Two times a day (BID) | ORAL | Status: DC
  Administered 2022-07-19 (×2): 12 mg via ORAL
  Filled 2022-07-18 (×3): qty 1

## 2022-07-18 MED ORDER — TRAMADOL HCL 50 MG PO TABS
50.0000 mg | ORAL_TABLET | Freq: Four times a day (QID) | ORAL | Status: DC | PRN
  Administered 2022-07-18: 50 mg via ORAL
  Administered 2022-07-19 – 2022-07-20 (×4): 100 mg via ORAL
  Filled 2022-07-18 (×5): qty 2

## 2022-07-18 MED ORDER — BUPIVACAINE HCL 0.25 % IJ SOLN
INTRAMUSCULAR | Status: AC
  Filled 2022-07-18: qty 1

## 2022-07-18 MED ORDER — SUGAMMADEX SODIUM 500 MG/5ML IV SOLN
INTRAVENOUS | Status: DC | PRN
  Administered 2022-07-18: 400 mg via INTRAVENOUS

## 2022-07-18 MED ORDER — PROPOFOL 10 MG/ML IV BOLUS
INTRAVENOUS | Status: DC | PRN
  Administered 2022-07-18: 200 mg via INTRAVENOUS

## 2022-07-18 MED ORDER — POLYETHYLENE GLYCOL 3350 17 GM/SCOOP PO POWD: 1.0000 | Freq: Once | ORAL | Status: DC

## 2022-07-18 MED ORDER — ALVIMOPAN 12 MG PO CAPS
12.0000 mg | ORAL_CAPSULE | ORAL | Status: AC
  Administered 2022-07-18: 12 mg via ORAL
  Filled 2022-07-18: qty 1

## 2022-07-18 MED ORDER — SODIUM CHLORIDE 0.9 % IR SOLN
Status: DC | PRN
  Administered 2022-07-18: 1000 mL via INTRAVESICAL

## 2022-07-18 MED ORDER — MELATONIN 3 MG PO TABS
3.0000 mg | ORAL_TABLET | Freq: Every evening | ORAL | Status: DC | PRN
  Filled 2022-07-18 (×3): qty 1

## 2022-07-18 MED ORDER — LIP MEDEX EX OINT
TOPICAL_OINTMENT | Freq: Two times a day (BID) | CUTANEOUS | Status: DC
  Administered 2022-07-18: 1 via TOPICAL
  Administered 2022-07-20: 75 via TOPICAL
  Filled 2022-07-18 (×2): qty 7

## 2022-07-18 MED ORDER — HYDROMORPHONE HCL 1 MG/ML IJ SOLN
INTRAMUSCULAR | Status: AC
  Filled 2022-07-18: qty 1

## 2022-07-18 MED ORDER — NEOMYCIN SULFATE 500 MG PO TABS: 1000.0000 mg | ORAL_TABLET | ORAL | Status: DC

## 2022-07-18 MED ORDER — KETAMINE HCL 10 MG/ML IJ SOLN
INTRAMUSCULAR | Status: DC | PRN
  Administered 2022-07-18: 20 mg via INTRAVENOUS
  Administered 2022-07-18 (×2): 10 mg via INTRAVENOUS
  Administered 2022-07-18: 20 mg via INTRAVENOUS

## 2022-07-18 MED ORDER — LIDOCAINE 2% (20 MG/ML) 5 ML SYRINGE
INTRAMUSCULAR | Status: DC | PRN
  Administered 2022-07-18: 100 mg via INTRAVENOUS

## 2022-07-18 MED ORDER — SODIUM CHLORIDE 0.9 % IV SOLN
2.0000 g | INTRAVENOUS | Status: AC
  Administered 2022-07-18: 2 g via INTRAVENOUS
  Filled 2022-07-18: qty 2

## 2022-07-18 MED ORDER — BISACODYL 5 MG PO TBEC: 20.0000 mg | DELAYED_RELEASE_TABLET | Freq: Once | ORAL | Status: DC

## 2022-07-18 MED ORDER — OXYCODONE HCL 5 MG PO TABS
5.0000 mg | ORAL_TABLET | Freq: Once | ORAL | Status: AC | PRN
  Administered 2022-07-18: 5 mg via ORAL

## 2022-07-18 SURGICAL SUPPLY — 130 items
ADAPTER GOLDBERG URETERAL (ADAPTER) IMPLANT
ADPR CATH 15X14FR FL DRN BG (ADAPTER)
APL PRP STRL LF DISP 70% ISPRP (MISCELLANEOUS) ×2
APPLIER CLIP 5 13 M/L LIGAMAX5 (MISCELLANEOUS)
APPLIER CLIP ROT 10 11.4 M/L (STAPLE)
APR CLP MED LRG 11.4X10 (STAPLE)
APR CLP MED LRG 5 ANG JAW (MISCELLANEOUS)
BAG COUNTER SPONGE SURGICOUNT (BAG) ×2 IMPLANT
BAG SPNG CNTER NS LX DISP (BAG)
BAG URO CATCHER STRL LF (MISCELLANEOUS) ×2 IMPLANT
BLADE EXTENDED COATED 6.5IN (ELECTRODE) IMPLANT
CANNULA REDUC XI 12-8 STAPL (CANNULA)
CANNULA REDUCER 12-8 DVNC XI (CANNULA) IMPLANT
CATH URETL OPEN END 6FR 70 (CATHETERS) IMPLANT
CELLS DAT CNTRL 66122 CELL SVR (MISCELLANEOUS) IMPLANT
CHLORAPREP W/TINT 26 (MISCELLANEOUS) IMPLANT
CLIP APPLIE 5 13 M/L LIGAMAX5 (MISCELLANEOUS) IMPLANT
CLIP APPLIE ROT 10 11.4 M/L (STAPLE) IMPLANT
CLOTH BEACON ORANGE TIMEOUT ST (SAFETY) ×2 IMPLANT
COVER SURGICAL LIGHT HANDLE (MISCELLANEOUS) ×4 IMPLANT
COVER TIP SHEARS 8 DVNC (MISCELLANEOUS) ×2 IMPLANT
COVER TIP SHEARS 8MM DA VINCI (MISCELLANEOUS) ×2
DEVICE TROCAR PUNCTURE CLOSURE (ENDOMECHANICALS) IMPLANT
DRAIN CHANNEL 19F RND (DRAIN) IMPLANT
DRAPE ARM DVNC X/XI (DISPOSABLE) ×8 IMPLANT
DRAPE COLUMN DVNC XI (DISPOSABLE) ×2 IMPLANT
DRAPE DA VINCI XI ARM (DISPOSABLE) ×8
DRAPE DA VINCI XI COLUMN (DISPOSABLE) ×2
DRAPE SURG IRRIG POUCH 19X23 (DRAPES) ×2 IMPLANT
DRSG OPSITE POSTOP 4X10 (GAUZE/BANDAGES/DRESSINGS) IMPLANT
DRSG OPSITE POSTOP 4X6 (GAUZE/BANDAGES/DRESSINGS) IMPLANT
DRSG OPSITE POSTOP 4X8 (GAUZE/BANDAGES/DRESSINGS) IMPLANT
DRSG TEGADERM 2-3/8X2-3/4 SM (GAUZE/BANDAGES/DRESSINGS) ×10 IMPLANT
DRSG TEGADERM 4X4.75 (GAUZE/BANDAGES/DRESSINGS) IMPLANT
ELECT PENCIL ROCKER SW 15FT (MISCELLANEOUS) ×2 IMPLANT
ELECT REM PT RETURN 15FT ADLT (MISCELLANEOUS) ×2 IMPLANT
ENDOLOOP SUT PDS II  0 18 (SUTURE)
ENDOLOOP SUT PDS II 0 18 (SUTURE) IMPLANT
EVACUATOR SILICONE 100CC (DRAIN) IMPLANT
GAUZE SPONGE 2X2 STRL 8-PLY (GAUZE/BANDAGES/DRESSINGS) ×2 IMPLANT
GLOVE ECLIPSE 8.0 STRL XLNG CF (GLOVE) ×6 IMPLANT
GLOVE INDICATOR 8.0 STRL GRN (GLOVE) ×6 IMPLANT
GLOVE SURG LX STRL 7.5 STRW (GLOVE) ×2 IMPLANT
GOWN SRG XL LVL 4 BRTHBL STRL (GOWNS) ×2 IMPLANT
GOWN STRL NON-REIN XL LVL4 (GOWNS) ×2
GOWN STRL REUS W/ TWL XL LVL3 (GOWN DISPOSABLE) ×10 IMPLANT
GOWN STRL REUS W/TWL XL LVL3 (GOWN DISPOSABLE) ×10
GRASPER SUT TROCAR 14GX15 (MISCELLANEOUS) IMPLANT
GUIDEWIRE ANG ZIPWIRE 038X150 (WIRE) IMPLANT
GUIDEWIRE STR DUAL SENSOR (WIRE) IMPLANT
HOLDER FOLEY CATH W/STRAP (MISCELLANEOUS) ×2 IMPLANT
IRRIG SUCT STRYKERFLOW 2 WTIP (MISCELLANEOUS) ×2
IRRIGATION SUCT STRKRFLW 2 WTP (MISCELLANEOUS) ×2 IMPLANT
KIT PROCEDURE DA VINCI SI (MISCELLANEOUS) ×2
KIT PROCEDURE DVNC SI (MISCELLANEOUS) ×2 IMPLANT
KIT SIGMOIDOSCOPE (SET/KITS/TRAYS/PACK) IMPLANT
KIT TURNOVER KIT A (KITS) IMPLANT
MANIFOLD NEPTUNE II (INSTRUMENTS) ×2 IMPLANT
NDL INSUFFLATION 14GA 120MM (NEEDLE) ×2 IMPLANT
NEEDLE INSUFFLATION 14GA 120MM (NEEDLE) IMPLANT
PACK CARDIOVASCULAR III (CUSTOM PROCEDURE TRAY) ×2 IMPLANT
PACK COLON (CUSTOM PROCEDURE TRAY) ×2 IMPLANT
PACK CYSTO (CUSTOM PROCEDURE TRAY) ×2 IMPLANT
PAD POSITIONING PINK XL (MISCELLANEOUS) ×2 IMPLANT
PROTECTOR NERVE ULNAR (MISCELLANEOUS) ×4 IMPLANT
RELOAD STAPLE 45 3.5 BLU DVNC (STAPLE) IMPLANT
RELOAD STAPLE 45 4.3 GRN DVNC (STAPLE) IMPLANT
RELOAD STAPLE 60 3.5 BLU DVNC (STAPLE) IMPLANT
RELOAD STAPLE 60 4.3 GRN DVNC (STAPLE) IMPLANT
RELOAD STAPLER 3.5X45 BLU DVNC (STAPLE) IMPLANT
RELOAD STAPLER 3.5X60 BLU DVNC (STAPLE) IMPLANT
RELOAD STAPLER 4.3X45 GRN DVNC (STAPLE) IMPLANT
RELOAD STAPLER 4.3X60 GRN DVNC (STAPLE) ×4 IMPLANT
RETRACTOR WND ALEXIS 18 MED (MISCELLANEOUS) IMPLANT
RTRCTR WOUND ALEXIS 18CM MED (MISCELLANEOUS)
SCISSORS LAP 5X35 DISP (ENDOMECHANICALS) ×2 IMPLANT
SEAL CANN UNIV 5-8 DVNC XI (MISCELLANEOUS) ×6 IMPLANT
SEAL XI 5MM-8MM UNIVERSAL (MISCELLANEOUS) ×6
SEALER VESSEL DA VINCI XI (MISCELLANEOUS) ×2
SEALER VESSEL EXT DVNC XI (MISCELLANEOUS) ×2 IMPLANT
SOL ELECTROSURG ANTI STICK (MISCELLANEOUS) ×2
SOLUTION ELECTROSURG ANTI STCK (MISCELLANEOUS) ×2 IMPLANT
SPIKE FLUID TRANSFER (MISCELLANEOUS) ×2 IMPLANT
STAPLER 45 DA VINCI SURE FORM (STAPLE)
STAPLER 45 SUREFORM DVNC (STAPLE) IMPLANT
STAPLER 60 DA VINCI SURE FORM (STAPLE) ×2
STAPLER 60 SUREFORM DVNC (STAPLE) IMPLANT
STAPLER CANNULA SEAL DVNC XI (STAPLE) ×2 IMPLANT
STAPLER CANNULA SEAL XI (STAPLE) ×2
STAPLER ECHELON POWER CIR 29 (STAPLE) IMPLANT
STAPLER ECHELON POWER CIR 31 (STAPLE) IMPLANT
STAPLER RELOAD 3.5X45 BLU DVNC (STAPLE)
STAPLER RELOAD 3.5X45 BLUE (STAPLE)
STAPLER RELOAD 3.5X60 BLU DVNC (STAPLE)
STAPLER RELOAD 3.5X60 BLUE (STAPLE)
STAPLER RELOAD 4.3X45 GREEN (STAPLE)
STAPLER RELOAD 4.3X45 GRN DVNC (STAPLE)
STAPLER RELOAD 4.3X60 GREEN (STAPLE) ×4
STAPLER RELOAD 4.3X60 GRN DVNC (STAPLE) ×4
STOPCOCK 4 WAY LG BORE MALE ST (IV SETS) ×4 IMPLANT
SURGILUBE 2OZ TUBE FLIPTOP (MISCELLANEOUS) IMPLANT
SUT MNCRL AB 4-0 PS2 18 (SUTURE) ×2 IMPLANT
SUT PDS AB 1 CT1 27 (SUTURE) ×4 IMPLANT
SUT PROLENE 0 CT 2 (SUTURE) IMPLANT
SUT PROLENE 2 0 KS (SUTURE) IMPLANT
SUT PROLENE 2 0 SH DA (SUTURE) IMPLANT
SUT SILK 2 0 (SUTURE)
SUT SILK 2 0 SH (SUTURE) IMPLANT
SUT SILK 2 0 SH CR/8 (SUTURE) IMPLANT
SUT SILK 2-0 18XBRD TIE 12 (SUTURE) IMPLANT
SUT SILK 3 0 (SUTURE)
SUT SILK 3 0 SH CR/8 (SUTURE) ×2 IMPLANT
SUT SILK 3-0 18XBRD TIE 12 (SUTURE) IMPLANT
SUT V-LOC BARB 180 2/0GR6 GS22 (SUTURE)
SUT VIC AB 3-0 SH 18 (SUTURE) IMPLANT
SUT VIC AB 3-0 SH 27 (SUTURE)
SUT VIC AB 3-0 SH 27XBRD (SUTURE) IMPLANT
SUT VICRYL 0 UR6 27IN ABS (SUTURE) ×2 IMPLANT
SUTURE V-LC BRB 180 2/0GR6GS22 (SUTURE) IMPLANT
SYR 20ML ECCENTRIC (SYRINGE) ×2 IMPLANT
SYS LAPSCP GELPORT 120MM (MISCELLANEOUS)
SYS WOUND ALEXIS 18CM MED (MISCELLANEOUS) ×2
SYSTEM LAPSCP GELPORT 120MM (MISCELLANEOUS) IMPLANT
SYSTEM WOUND ALEXIS 18CM MED (MISCELLANEOUS) ×2 IMPLANT
TOWEL OR NON WOVEN STRL DISP B (DISPOSABLE) ×2 IMPLANT
TRAY FOLEY MTR SLVR 16FR STAT (SET/KITS/TRAYS/PACK) ×2 IMPLANT
TROCAR ADV FIXATION 5X100MM (TROCAR) ×2 IMPLANT
TUBING CONNECTING 10 (TUBING) ×6 IMPLANT
TUBING INSUFFLATION 10FT LAP (TUBING) ×2 IMPLANT
TUBING UROLOGY SET (TUBING) IMPLANT

## 2022-07-18 NOTE — Transfer of Care (Signed)
Immediate Anesthesia Transfer of Care Note  Patient: Neil Mcintyre  Procedure(s) Performed: Procedure(s) with comments: ROBOTIC RESECTION OF COLON, SPLENIC FLEXURE, Incarcerated incisional hernia repair (N/A) - GEN w/ERAS PATHWAY EXPEREL RIGID PROCTOSCOPY (N/A) CYSTOSCOPY with FIREFLY INJECTION (Bilateral)  Patient Location: PACU  Anesthesia Type:General  Level of Consciousness:  sedated, patient cooperative and responds to stimulation  Airway & Oxygen Therapy:Patient Spontanous Breathing and Patient connected to face mask oxgen  Post-op Assessment:  Report given to PACU RN and Post -op Vital signs reviewed and stable  Post vital signs:  Reviewed and stable  Last Vitals:  Vitals:   07/18/22 0538 07/18/22 1152  BP: (!) 159/90 (!) 136/92  Pulse: (!) 107 91  Resp: 16 16  Temp: 36.6 C   SpO2:  [id number]    Complications: No apparent anesthesia complications

## 2022-07-18 NOTE — H&P (Signed)
07/18/2022   REFERRING PHYSICIAN: Ria Bush, MD  Patient Care Team: Ria Bush, MD as PCP - General (Family Medicine) Johney Maine, Adrian Saran, MD as Consulting Provider (General Surgery) Annamaria Helling, DO as Consulting Provider (Gastroenterology)  PROVIDER: Hollace Kinnier, MD  [facility] MRN: [id number] DOB: 1975/10/04  SUBJECTIVE   Chief Complaint: New Consultation   Neil Mcintyre is a 47 y.o. male  who is seen today as an office consultation  at the request of DrVirgina Jock for evaluation of recurrent GI bleed.  History of Present Illness:  Pleasant obese male here with his wife. Had episode of bright red rectal bleeding in 2016. Bleeding scan suspicious for source at splenic flexure colon. Underwent angiography and segmental embolization by Dr. Lucky Cowboy in [facility]. Patient also had diverticulitis and underwent lap sigmoid colectomy in 2017. Patient is not on blood thinners. Does not take nonsteroidals. No history of aspirin. Patient had episode of significant rectal bleeding. Red with clots. Somewhat dark. Not black or tarry. Came to [place] [facility] was admitted earlier this month. Had CT angiography which showed no definite bleeding but bleeding scan again showed bleeding in the splenic flexure of the colon. Colonoscopy done showing no active bleeding. 1 tiny adenomatous polyp but no other mass or tumor or other abnormalities. Diverticulosis noted. Patient had recurrent bleeding patient wished to be more aggressive. Because patient's had already attempted embolization and sigmoid colectomy it was not felt to be a great candidate for repeat embolization. Therefore surgical consultation offered.  Patient used to smoke but is transition to vaping. He does have some snoring but has never been diagnosed with sleep apnea. Has a mildly elevated hemoglobin A1c in the mid sixes. Trying to do diet control. Not on any hypoglycemics. Does have hypertension on 2  medications. He usually moves his bowels every other day. With the bleeding episode he has been going once or twice a day. Claims he can walk at least 30 minutes out difficulty. No cardiac or pulmonary issues. Aside from the sigmoid colectomy is has not had any other abdominal surgery.  Medical History:  Past Medical History:  Diagnosis Date  Acute diverticulitis 2007  Anxiety  Attention deficit disorder  Carpal tunnel syndrome  Chickenpox  Diverticulosis  Hypertension  Male pattern baldness   Patient Active Problem List  Diagnosis  Rotator cuff tendinitis  Subacromial bursitis  Acromioclavicular joint arthritis  Essential hypertension, benign  Anxiety  Dyslipidemia  Prediabetes  Mood disorder (CMS-HCC)  Colonic hemorrhage  Diverticulosis of colon  BMI 40.0-44.9, adult (CMS-HCC)  Engages in vaping  Diabetes mellitus type 2, noninsulin dependent (CMS-HCC)   Past Surgical History:  Procedure Laterality Date  COLONOSCOPY 09/22/2014  Diverticulosis/Examination was otherwise normal/Repeat 13yr at age 17/PYO  LAPAROSCOPIC COLECTOMY PARTIAL W/ANASTAMOSIS  PHOTOREFRACTIVE KERATOTOMY/LASIK    No Known Allergies  Current Outpatient Medications on File Prior to Visit  Medication Sig Dispense Refill  metoprolol succinate (TOPROL-XL) 25 MG XL tablet Take 1 tablet by mouth once daily  amLODIPine (NORVASC) 5 MG tablet Take 1 tablet by mouth once daily 90 tablet 1  azelastine (ASTELIN) 137 mcg nasal spray Place 1 spray into both nostrils 2 (two) times daily 10 mL 1  buPROPion (WELLBUTRIN XL) 150 MG XL tablet TAKE 1 TABLET BY MOUTH ONCE DAILY (Patient not taking: Reported on 07/26/2018) 90 tablet 1  LORazepam (ATIVAN) 0.5 MG tablet Take 1 tablet (0.5 mg total) by mouth once daily as needed for Anxiety. (Patient not taking: Reported on 12/30/2017 ) 90  tablet 0  losartan-hydrochlorothiazide (HYZAAR) 100-25 mg tablet TAKE 1 TABLET BY MOUTH ONCE DAILY 90 tablet 0  valACYclovir (VALTREX) 500  MG tablet TAKE 1 TABLET (500 MG TOTAL) BY MOUTH ONCE DAILY. 30 tablet 5   No current facility-administered medications on file prior to visit.   Family History  Adopted: Yes    Social History   Tobacco Use  Smoking Status Never  Smokeless Tobacco Never    Social History   Socioeconomic History  Marital status: Married  Occupational History  Occupation: produce department  Employer: [id number]  Comment: [employer]  Tobacco Use  Smoking status: Never  Smokeless tobacco: Never  Substance and Sexual Activity  Alcohol use: Yes  Alcohol/week: 0.0 standard drinks of alcohol  Comment: socially  Drug use: Never  Sexual activity: Defer   ############################################################  Review of Systems: A complete review of systems (ROS) was obtained from the patient.  We have reviewed this information and discussed as appropriate with the patient.  See HPI as well for other pertinent ROS.  Constitutional: No fevers, chills, sweats. Weight stable Eyes: No vision changes, No discharge HENT: No sore throats, nasal drainage Lymph: No neck swelling, No bruising easily Pulmonary: No cough, productive sputum CV: No orthopnea, PND . No exertional chest/neck/shoulder/arm pain. Patient can walk 30 minutes without difficulty.   GI: No personal nor family history of GI/colon cancer, inflammatory bowel disease, irritable bowel syndrome, allergy such as Celiac Sprue, dietary/dairy problems, colitis, ulcers nor gastritis. No recent sick contacts/gastroenteritis. No travel outside the country. No changes in diet.  Renal: No UTIs, No hematuria Genital: No drainage, bleeding, masses Musculoskeletal: No severe joint pain. Good ROM major joints Skin: No sores or lesions Heme/Lymph: No easy bleeding. No swollen lymph nodes Neuro: No active seizures. No facial droop Psych: No hallucinations. No agitation  OBJECTIVE   Vitals:  07/02/22 1024  BP: 138/84  Pulse: 103   Temp: 36.1 C (97 F)  SpO2: 96%  Weight: (!) 125.2 kg (276 lb)  Height: 167.6 cm (5' 6"$ )   Body mass index is 44.55 kg/m.  PHYSICAL EXAM:  Constitutional: Not cachectic. Hygeine adequate. Vitals signs as above.  Eyes: No glasses. Vision adequate,Pupils reactive, normal extraocular movements. Sclera nonicteric Neuro: CN II-XII intact. No major focal sensory defects. No major motor deficits. Lymph: No head/neck/groin lymphadenopathy Psych: No severe agitation. No severe anxiety. Judgment & insight Adequate, Oriented x4, HENT: Normocephalic, Mucus membranes moist. No thrush. Hearing: adequate Neck: Supple, No tracheal deviation. No obvious thyromegaly Chest: No pain to chest wall compression. Good respiratory excursion. No audible wheezing CV: Pulses intact. regular. No major extremity edema Ext: No obvious deformity or contracture. Edema: Not present. No cyanosis Skin: No major subcutaneous nodules. Warm and dry Musculoskeletal: Severe joint rigidity not present. No obvious clubbing. No digital petechiae. Mobility: no assist device moving easily without restrictions  Abdomen: Obese Soft. Nondistended. Nontender. Hernia: No major incisional hernia but he does have a left anterolateral deep abdominal wall mass that seems to correlate with a possible spigelian type incisional hernia at his extraction incisional left lower quadrant. By CAT scan it is only fat-containing. It appears incarcerated.. Diastasis recti: Not present. No hepatomegaly. No splenomegaly.  Genital/Pelvic: Inguinal hernia: Not present. Inguinal lymph nodes: without lymphadenopathy nor hidradenitis.   Rectal: Perianal skin Mild fecal soiling  Pruritis ani: Not present Pilonidal disease: Not present Condyloma / warts: Not present  Anal fissure: Not present Perirectal abscess/fistula Not present External hemorrhoids Not present  Digital and  anoscopic rectal exam tolerated  Sphincter tone Normal  Hemorrhoidal piles  grade 1 and 2 internal hemorrhoids without inflammation ulceration or bleeding. No prolapse. No proctitis. Prostate: Normal size & no nodularity Rectovaginal septum: N/A Rectal masses: Not present  Other significant findings: N/A  Patient examined with assistance of male Medical Assistant in the room with patient in decubitus position .  ###################################    ###################################################################  Labs, Imaging and Diagnostic Testing:  Located in 'Care Everywhere' section of Epic EMR chart  PRIOR [facility] CLINIC NOTES:  Not applicable  SURGERY NOTES:  Not applicable  PATHOLOGY:  Not applicable  Assessment and Plan:  DIAGNOSES:  Diagnoses and all orders for this visit:  Colonic hemorrhage  Diverticulosis of colon  BMI 40.0-44.9, adult (CMS-HCC)  Engages in vaping  Diabetes mellitus type 2, noninsulin dependent (CMS-HCC)  Other orders - polyethylene glycol (MIRALAX) powder; Take 233.75 g by mouth once for 1 dose Take according to your procedure prep instructions. - bisacodyL (DULCOLAX) 5 mg EC tablet; Take 4 tablets (20 mg total) by mouth once daily as needed for Constipation for up to 1 dose - metroNIDAZOLE (FLAGYL) 500 MG tablet; Take 2 tablets (1,000 mg total) by mouth 3 (three) times daily for 3 doses SEE BOWEL PREP INSTRUCTIONS: Take 2 tablets at 2pm, 3pm, and 10pm the day prior to your colon operation. - neomycin 500 mg tablet; Take 2 tablets (1,000 mg total) by mouth 3 (three) times daily for 3 doses SEE BOWEL PREP INSTRUCTIONS: Take 2 tablets at 2pm, 3pm, and 10pm the day prior to your colon operation.    ASSESSMENT/PLAN  Pleasant morbidly obese male with episodes of recurrent GI bleeding. Dark red/red makes it lower GI. He has had 2 bleeding scan ( 2016 and this month 2024) that localized to the left upper quadrant at the splenic flexure. He had 1 embolization done and has recurrence. He is already had a sigmoid  colectomy for diverticulitis without any bleeding at the time. I am guarded against recommending repeat embolization since I do not know how effective it would be w prior embolization vascular/interventional were hesitant to proceed as well w higher ischemia risks. Discussed with  partners Dr. Dema Severin and Marcello Moores given the atypical presentation.  One option is just observation with good fiber bowel regimen to hopefully prevent rebleeding. He does not have any other risk factors such as being on nonsteroidals or fully anticoagulated. He does not want to go through this again and wishes to be more aggressive.  Given the fact that he has had significant GI bleeding recurrence in a rather young age, I am leaning towards a segmental colonic resection to get rid of the bleeding area has been documented well by bleeding scans x 2 to the same location.. Most likely would be a robotic left hemicolectomy to the mid transverse colon/right middle colic pedicle. May have to [place]. Resection of old sigmoid anastomosis. Mobilization. Transverse to rectal anastomosis. Try to avoid abdominal colectomy with ileorectal anastomosis. Hopefully can do this electively & not an emergency in order to lower the risk of the need for an abdominal colectomy with ileostomy and multiple operations. He would like to avoid that if he can. I did caution his biggest risk is that he could get recurrent bleeding as that was not the source. I quoted a 10-20% chance. The anatomy & physiology of the digestive tract was discussed. The pathophysiology of the colon was discussed. Natural history risks without surgery was discussed. I feel the risks of  no intervention will lead to serious problems that outweigh the operative risks; therefore, I recommended a partial colectomy to remove the pathology. Minimally invasive (Robotic/Laparoscopic) & open techniques were discussed.   Risks such as bleeding, infection, abscess, leak, reoperation, injury to other  organs, need for repair of tissues / organs, possible ostomy, hernia, heart attack, stroke, death, and other risks were discussed. I noted a good likelihood this will help address the problem. Goals of post-operative recovery were discussed as well. Need for adequate nutrition, daily bowel regimen and healthy physical activity, to optimize recovery was noted as well. We will work to minimize complications. Educational materials were available as well. Questions were answered. The patient expresses understanding & wishes to proceed with surgery.  Another option for completion is to do upper endoscopy and capsule endoscopy to rule out any other bleeding source. He does not give me any significant risk factors and he already has a study that is localized at, so I do not think it would be high yield. He does not feel strong proceeding with that first.  Because of his significant bleeding with recurrence after 1 attempt and embolization and already prior colectomy, he wishes to be aggressive and proceed with surgery. Will work to set it up.  He may have an incarcerated incisional hernia and his left lower quadrant extraction site. I may have to have to reduce that to do surgery in case I will most likely need to primarily repair. Recurrence higher but not a good idea to put mesh at the same time of the colon surgery. I would do a Pfannenstiel extraction and try and avoid pulling the colon out the hernia site unless it is already very large. We will see.  Adin Hector, MD, FACS, MASCRS Esophageal, Gastrointestinal & Colorectal Surgery Robotic and Minimally Invasive Surgery  [facility] [id number] [place], [place] [address] [phone] Fax [phone] Main  CONTACT INFORMATION:  Weekday (9AM-5PM): Call [facility] main office at [phone]  Weeknight (5PM-9AM) or Weekend/Holiday: Check [email or web address] (password " [email or web address]") for General  Surgery [facility] coverage  (Please, do not use SecureChat as it is not reliable communication to reach operating surgeons for immediate patient care given surgeries/outpatient duties/clinic/cross-coverage/off post-call which would lead to a delay in care.  Epic staff messaging available for outptient concerns, but may not be answered for 48 hours or more).    07/18/2022

## 2022-07-18 NOTE — Anesthesia Procedure Notes (Signed)
Procedure Name: Intubation Date/Time: 07/18/2022 7:40 AM  Performed by: Lavina Hamman, CRNAPre-anesthesia Checklist: Patient identified, Emergency Drugs available, Suction available, Patient being monitored and Timeout performed Patient Re-evaluated:Patient Re-evaluated prior to induction Oxygen Delivery Method: Circle system utilized Preoxygenation: Pre-oxygenation with 100% oxygen Induction Type: IV induction Ventilation: Two handed mask ventilation required and Oral airway inserted - appropriate to patient size Laryngoscope Size: Mac and 4 Grade View: Grade II Tube type: Oral Tube size: 7.0 mm Number of attempts: 1 Airway Equipment and Method: Stylet Placement Confirmation: ETT inserted through vocal cords under direct vision, positive ETCO2, CO2 detector and breath sounds checked- equal and bilateral Secured at: 22 cm Tube secured with: Tape Dental Injury: Teeth and Oropharynx as per pre-operative assessment  Comments: ATOI, mask difficult due to large beard.

## 2022-07-18 NOTE — Discharge Instructions (Signed)
SURGERY: POST OP INSTRUCTIONS (Surgery for small bowel obstruction, colon resection, etc)   ######################################################################  EAT Gradually transition to a high fiber diet with a fiber supplement over the next few days after discharge  WALK Walk an hour a day.  Control your pain to do that.    CONTROL PAIN Control pain so that you can walk, sleep, tolerate sneezing/coughing, go up/down stairs.  HAVE A BOWEL MOVEMENT DAILY Keep your bowels regular to avoid problems.  OK to try a laxative to override constipation.  OK to use an antidairrheal to slow down diarrhea.  Call if not better after 2 tries  CALL IF YOU HAVE PROBLEMS/CONCERNS Call if you are still struggling despite following these instructions. Call if you have concerns not answered by these instructions  ######################################################################   DIET Follow a light diet the first few days at home.  Start with a bland diet such as soups, liquids, starchy foods, low fat foods, etc.  If you feel full, bloated, or constipated, stay on a ful liquid or pureed/blenderized diet for a few days until you feel better and no longer constipated. Be sure to drink plenty of fluids every day to avoid getting dehydrated (feeling dizzy, not urinating, etc.). Gradually add a fiber supplement to your diet over the next week.  Gradually get back to a regular solid diet.  Avoid fast food or heavy meals the first week as you are more likely to get nauseated. It is expected for your digestive tract to need a few months to get back to normal.  It is common for your bowel movements and stools to be irregular.  You will have occasional bloating and cramping that should eventually fade away.  Until you are eating solid food normally, off all pain medications, and back to regular activities; your bowels will not be normal. Focus on eating a low-fat, high fiber diet the rest of your life  (See Getting to Good Bowel Health, below).  CARE of your INCISION or WOUND  It is good for closed incisions and even open wounds to be washed every day.  Shower every day.  Short baths are fine.  Wash the incisions and wounds clean with soap & water.    You may leave closed incisions open to air if it is dry.   You may cover the incision with clean gauze & replace it after your daily shower for comfort.  TEGADERM:  You have clear gauze band-aid dressings over your closed incision(s).  Remove the dressings 3 days after surgery.    If you have an open wound with a wound vac, see wound vac care instructions.    ACTIVITIES as tolerated Start light daily activities --- self-care, walking, climbing stairs-- beginning the day after surgery.  Gradually increase activities as tolerated.  Control your pain to be active.  Stop when you are tired.  Ideally, walk several times a day, eventually an hour a day.   Most people are back to most day-to-day activities in a few weeks.  It takes 4-8 weeks to get back to unrestricted, intense activity. If you can walk 30 minutes without difficulty, it is safe to try more intense activity such as jogging, treadmill, bicycling, low-impact aerobics, swimming, etc. Save the most intensive and strenuous activity for last (Usually 4-8 weeks after surgery) such as sit-ups, heavy lifting, contact sports, etc.  Refrain from any intense heavy lifting or straining until you are off narcotics for pain control.  You will have off days, but   things should improve week-by-week. DO NOT PUSH THROUGH PAIN.  Let pain be your guide: If it hurts to do something, don't do it.  Pain is your body warning you to avoid that activity for another week until the pain goes down. You may drive when you are no longer taking narcotic prescription pain medication, you can comfortably wear a seatbelt, and you can safely make sudden turns/stops to protect yourself without hesitating due to pain. You may  have sexual intercourse when it is comfortable. If it hurts to do something, stop.  MEDICATIONS Take your usually prescribed home medications unless otherwise directed.   Blood thinners:  Usually you can restart any strong blood thinners after the second postoperative day.  It is OK to take aspirin right away.     If you are on strong blood thinners (warfarin/Coumadin, Plavix, Xerelto, Eliquis, Pradaxa, etc), discuss with your surgeon, medicine PCP, and/or cardiologist for instructions on when to restart the blood thinner & if blood monitoring is needed (PT/INR blood check, etc).     PAIN CONTROL Pain after surgery or related to activity is often due to strain/injury to muscle, tendon, nerves and/or incisions.  This pain is usually short-term and will improve in a few months.  To help speed the process of healing and to get back to regular activity more quickly, DO THE FOLLOWING THINGS TOGETHER: Increase activity gradually.  DO NOT PUSH THROUGH PAIN Use Ice and/or Heat Try Gentle Massage and/or Stretching Take over the counter pain medication Take Narcotic prescription pain medication for more severe pain  Good pain control = faster recovery.  It is better to take more medicine to be more active than to stay in bed all day to avoid medications.  Increase activity gradually Avoid heavy lifting at first, then increase to lifting as tolerated over the next 6 weeks. Do not "push through" the pain.  Listen to your body and avoid positions and maneuvers than reproduce the pain.  Wait a few days before trying something more intense Walking an hour a day is encouraged to help your body recover faster and more safely.  Start slowly and stop when getting sore.  If you can walk 30 minutes without stopping or pain, you can try more intense activity (running, jogging, aerobics, cycling, swimming, treadmill, sex, sports, weightlifting, etc.) Remember: If it hurts to do it, then don't do it! Use Ice and/or  Heat You will have swelling and bruising around the incisions.  This will take several weeks to resolve. Ice packs or heating pads (6-8 times a day, 30-60 minutes at a time) will help sooth soreness & bruising. Some people prefer to use ice alone, heat alone, or alternate between ice & heat.  Experiment and see what works best for you.  Consider trying ice for the first few days to help decrease swelling and bruising; then, switch to heat to help relax sore spots and speed recovery. Shower every day.  Short baths are fine.  It feels good!  Keep the incisions and wounds clean with soap & water.   Try Gentle Massage and/or Stretching Massage at the area of pain many times a day Stop if you feel pain - do not overdo it Take over the counter pain medication This helps the muscle and nerve tissues become less irritable and calm down faster Choose ONE of the following over-the-counter anti-inflammatory medications: Acetaminophen 500mg tabs (Tylenol) 1-2 pills with every meal and just before bedtime (avoid if you have liver problems or   if you have acetaminophen in you narcotic prescription) Naproxen 220mg tabs (ex. Aleve, Naprosyn) 1-2 pills twice a day (avoid if you have kidney, stomach, IBD, or bleeding problems) Ibuprofen 200mg tabs (ex. Advil, Motrin) 3-4 pills with every meal and just before bedtime (avoid if you have kidney, stomach, IBD, or bleeding problems) Take with food/snack several times a day as directed for at least 2 weeks to help keep pain / soreness down & more manageable. Take Narcotic prescription pain medication for more severe pain A prescription for strong pain control is often given to you upon discharge (for example: oxycodone/Percocet, hydrocodone/Norco/Vicodin, or tramadol/Ultram) Take your pain medication as prescribed. Be mindful that most narcotic prescriptions contain Tylenol (acetaminophen) as well - avoid taking too much Tylenol. If you are having problems/concerns with  the prescription medicine (does not control pain, nausea, vomiting, rash, itching, etc.), please call us [phone] to see if we need to switch you to a different pain medicine that will work better for you and/or control your side effects better. If you need a refill on your pain medication, you must call the office before 4 pm and on weekdays only.  By federal law, prescriptions for narcotics cannot be called into a pharmacy.  They must be filled out on paper & picked up from our office by the patient or authorized caretaker.  Prescriptions cannot be filled after 4 pm nor on weekends.    WHEN TO CALL US [phone] Severe uncontrolled or worsening pain  Fever over 101 F (38.5 C) Concerns with the incision: Worsening pain, redness, rash/hives, swelling, bleeding, or drainage Reactions / problems with new medications (itching, rash, hives, nausea, etc.) Nausea and/or vomiting Difficulty urinating Difficulty breathing Worsening fatigue, dizziness, lightheadedness, blurred vision Other concerns If you are not getting better after two weeks or are noticing you are getting worse, contact our office [phone] for further advice.  We may need to adjust your medications, re-evaluate you in the office, send you to the emergency room, or see what other things we can do to help. The clinic staff is available to answer your questions during regular business hours (8:30am-5pm).  Please don't hesitate to call and ask to speak to one of our nurses for clinical concerns.    A surgeon from [facility] is always on call at the hospitals 24 hours/day If you have a medical emergency, go to the nearest emergency room or call 911.  FOLLOW UP in our office One the day of your discharge from the hospital (or the next business weekday), please call [facility] to set up or confirm an appointment to see your surgeon in the office for a follow-up appointment.  Usually it is 2-3 weeks  after your surgery.   If you have skin staples at your incision(s), let the office know so we can set up a time in the office for the nurse to remove them (usually around 10 days after surgery). Make sure that you call for appointments the day of discharge (or the next business weekday) from the hospital to ensure a convenient appointment time. IF YOU HAVE DISABILITY OR FAMILY LEAVE FORMS, BRING THEM TO THE OFFICE FOR PROCESSING.  DO NOT GIVE THEM TO YOUR DOCTOR.  [facility] [address], [address], [place], [place]  [address] ? [phone] - Main [phone] - Toll Free,  [phone] - Fax [email or web address]    GETTING TO GOOD BOWEL HEALTH. It is expected for your   digestive tract to need a few months to get back to normal.  It is common for your bowel movements and stools to be irregular.  You will have occasional bloating and cramping that should eventually fade away.  Until you are eating solid food normally, off all pain medications, and back to regular activities; your bowels will not be normal.   Avoiding constipation The goal: ONE SOFT BOWEL MOVEMENT A DAY!    Drink plenty of fluids.  Choose water first. TAKE A FIBER SUPPLEMENT EVERY DAY THE REST OF YOUR LIFE During your first week back home, gradually add back a fiber supplement every day Experiment which form you can tolerate.   There are many forms such as powders, tablets, wafers, gummies, etc Psyllium bran (Metamucil), methylcellulose (Citrucel), Miralax or Glycolax, Benefiber, Flax Seed.  Adjust the dose week-by-week (1/2 dose/day to 6 doses a day) until you are moving your bowels 1-2 times a day.  Cut back the dose or try a different fiber product if it is giving you problems such as diarrhea or bloating. Sometimes a laxative is needed to help jump-start bowels if constipated until the fiber supplement can help regulate your bowels.  If you are tolerating eating & you are farting, it  is okay to try a gentle laxative such as double dose MiraLax, prune juice, or Milk of Magnesia.  Avoid using laxatives too often. Stool softeners can sometimes help counteract the constipating effects of narcotic pain medicines.  It can also cause diarrhea, so avoid using for too long. If you are still constipated despite taking fiber daily, eating solids, and a few doses of laxatives, call our office. Controlling diarrhea Try drinking liquids and eating bland foods for a few days to avoid stressing your intestines further. Avoid dairy products (especially milk & ice cream) for a short time.  The intestines often can lose the ability to digest lactose when stressed. Avoid foods that cause gassiness or bloating.  Typical foods include beans and other legumes, cabbage, broccoli, and dairy foods.  Avoid greasy, spicy, fast foods.  Every person has some sensitivity to other foods, so listen to your body and avoid those foods that trigger problems for you. Probiotics (such as active yogurt, Align, etc) may help repopulate the intestines and colon with normal bacteria and calm down a sensitive digestive tract Adding a fiber supplement gradually can help thicken stools by absorbing excess fluid and retrain the intestines to act more normally.  Slowly increase the dose over a few weeks.  Too much fiber too soon can backfire and cause cramping & bloating. It is okay to try and slow down diarrhea with a few doses of antidiarrheal medicines.   Bismuth subsalicylate (ex. Kayopectate, Pepto Bismol) for a few doses can help control diarrhea.  Avoid if pregnant.   Loperamide (Imodium) can slow down diarrhea.  Start with one tablet (2mg) first.  Avoid if you are having fevers or severe pain.  ILEOSTOMY PATIENTS WILL HAVE CHRONIC DIARRHEA since their colon is not in use.    Drink plenty of liquids.  You will need to drink even more glasses of water/liquid a day to avoid getting dehydrated. Record output from your  ileostomy.  Expect to empty the bag every 3-4 hours at first.  Most people with a permanent ileostomy empty their bag 4-6 times at the least.   Use antidiarrheal medicine (especially Imodium) several times a day to avoid getting dehydrated.  Start with a dose at bedtime & breakfast.    Adjust up or down as needed.  Increase antidiarrheal medications as directed to avoid emptying the bag more than 8 times a day (every 3 hours). Work with your wound ostomy nurse to learn care for your ostomy.  See ostomy care instructions. TROUBLESHOOTING IRREGULAR BOWELS 1) Start with a soft & bland diet. No spicy, greasy, or fried foods.  2) Avoid gluten/wheat or dairy products from diet to see if symptoms improve. 3) Miralax 17gm or flax seed mixed in 8oz. water or juice-daily. May use 2-4 times a day as needed. 4) Gas-X, Phazyme, etc. as needed for gas & bloating.  5) Prilosec (omeprazole) over-the-counter as needed 6)  Consider probiotics (Align, Activa, etc) to help calm the bowels down  Call your doctor if you are getting worse or not getting better.  Sometimes further testing (cultures, endoscopy, X-ray studies, CT scans, bloodwork, etc.) may be needed to help diagnose and treat the cause of the diarrhea. [facility] [address], [address], [place], [place]  [address] [phone] - Main.    [phone]  - Toll Free.   [phone] - Fax [email or web address]  

## 2022-07-18 NOTE — Op Note (Signed)
Preoperative diagnosis:  1.  For colon surgery with need for intraoperative identification of ureters  Postoperative diagnosis: 1.  Same  Procedure(s): 1.  Cystoscopy, bilateral retrogrades with firefly injection  Surgeon: Dr. Harold Barban  Anesthesia: General  Complications: None  EBL: Minimal  Specimens: None  Disposition of specimens: Not applicable  Intraoperative findings: Normal bilateral collecting systems on retrograde.  Firefly injected without difficulty.  Indication: 47 year old white male with history of GI bleeding and diverticulitis.  Is scheduled for elective colon resection.  I have been asked to perform cystoscopy and firefly injection for intraoperative identification of the ureters  Description of procedure:  After obtaining informed consent the patient was taken major OR suite placed under general anesthesia.  Placed in dorsolithotomy position genitalia prepped draped usual sterile fashion.  Pause and timeout was performed.  82 [address] pacifically advanced in the bladder without difficulty.  He had mild elevation of bladder neck but fairly short prostatic urethra.  Bladder was inspected feel grossly normal without lesions.  Left ureteral orifice was identified.  Operative catheter was advanced into the bladder through the working channel of the cystoscope.  A sensor wire was guided inside the left ureteral orifice and the open tip catheter was then advanced over the guidewire and up to the renal pelvis under fluoroscopy.  EXTR of firefly and contrast was injected to outline the collecting system and ureter ureter.  There was no evidence of abnormality or filling defect.  Similar manner right retrograde and firefly injection was performed on the right side utilizing same technique.  The scope was removed 38 [place] Foley was placed at termination of the case.  This terminated this portion of the procedure he was stable at termination of this procedure.:  Procedure to  be dictated under separate operative note by Dr. Johney Maine

## 2022-07-18 NOTE — Op Note (Signed)
07/18/2022  11:41 AM  PATIENT:  Neil Mcintyre  47 y.o. male  Patient Care Team: Ria Bush, MD as PCP - General (Family Medicine) Michael Boston, MD as Consulting Physician (General Surgery) Annamaria Helling, DO as Consulting Physician (Gastroenterology)  PRE-OPERATIVE DIAGNOSIS: RECURRENT COLON BLEEDING AT SPLENIC FLEXURE  POST-OPERATIVE DIAGNOSIS:   RECURRENT COLON BLEEDING AT SPLENIC FLEXURE INCARCERATED [place] (4X3CM)  PROCEDURE:   -EXTENDED LEFT HEMICOLECTOMY -MOBILIZATION OF SPLENIC FLEXURE OF COLON -EN BLOC RESECTION OF COLORECTAL ANASTOMOSIS -OMENTECTOMY -REDUCTION & PRIMARY REPAIR OF INCISIONAL ANASTOMOSIS -INTRAOPERATIVE ASSESSMENT OF TISSUE VASCULAR PERFUSION USING ICG (indocyanine green) IMMUNOFLUORESCENCE -TRANSVERSUS ABDOMINIS PLANE (TAP) BLOCK - BILATERAL -RIGID PROCTOSCOPY  SURGEON:  Adin Hector, MD  ASSISTANT:  Leighton Ruff, MD  An experienced assistant was required given the standard of surgical care given the complexity of the case.  This assistant was needed for exposure, dissection, suction, tissue approximation, retraction, perception, etc  ANESTHESIA:  General endotracheal intubation anesthesia (GETA) and Regional TRANSVERSUS ABDOMINIS PLANE (TAP) nerve block -BILATERAL for perioperative & postoperative pain control at the level of the transverse abdominis & preperitoneal spaces along the flank at the anterior axillary line, from subcostal ridge to iliac crest under laparoscopic guidance provided with liposomal bupivacaine (Experel) 36m mixed with 40 mL of bupivicaine 0.25% with epinephrine  Estimated Blood Loss (EBL):   Total I/O In: 1400 [I.V.:1400] Out: 200 [Urine:100; Blood:100].   (See anesthesia record)  Delay start of Pharmacological VTE agent (>24hrs) due to concerns of significant anemia, surgical blood loss, or risk of bleeding?:  no  DRAINS: (None)  SPECIMEN:  Colon - distal left & & rectal anastomotic  ring  DISPOSITION OF SPECIMEN:  Pathology  COUNTS:  Sponge, needle, & instrument counts CORRECT  PLAN OF CARE: Admit to inpatient   PATIENT DISPOSITION:  PACU - hemodynamically stable.  INDICATION:    Pleasant morbidly obese male.  History of diverticulitis sigmoid colectomy in the past.  He has had numerous episodes of gastrointestinal bleeding.  Usually dark red bright red consistent with lower digestive tract.  He has had 2 bleeding scan that identified at the splenic flexure.  Had 1 attempted embolization of the splenic flexure.  Had recurrent bleeding.  Bleeding scan last month confirms persistent bleeding in the splenic flexure of the colon.  With recurrent episodes of bleeding including after regional embolization, I recommended segmental resection:  The anatomy & physiology of the digestive tract was discussed.  The pathophysiology was discussed.  Natural history risks without surgery was discussed.   I worked to give an overview of the disease and the frequent need to have multispecialty involvement.  I feel the risks of no intervention will lead to serious problems that outweigh the operative risks; therefore, I recommended a partial colectomy to remove the pathology.  Laparoscopic & open techniques were discussed.   Risks such as bleeding, infection, abscess, leak, reoperation, possible ostomy, hernia, heart attack, death, and other risks were discussed.  I noted a good likelihood this will help address the problem.   Goals of post-operative recovery were discussed as well.  We will work to minimize complications.  Educational materials on the pathology had been given in the office.  Questions were answered.    The patient expressed understanding & wished to proceed with surgery.  OR FINDINGS:   Patient had moderate adhesions.  Large swath of greater omentum incarcerated in left lower quadrant paramedian incision from prior left colectomy.  Omentum reduced removed and primary repair  done.  Thick inflamed irritated colon with large swath of dense adhesions at the splenic flexure and omentum.  Prior colorectal anastomosis noted at the rectosigmoid junction.  Resection done to the proximal mid transverse colon using a dominant right middle colic artery pedicle.  No obvious metastatic disease on visceral parietal peritoneum or liver.  It is a 59m EEA anastomosis (proximal mid transverse colon connected to proximal rectum.)  It rests 15 cm from the anal verge by rigid proctoscopy.  CASE DATA:  Type of patient?: Elective [facility] Private Case  Status of Case? Elective Scheduled  Infection Present At Time Of Surgery (PATOS)?  NO  DESCRIPTION:   Informed consent was confirmed.  The patient underwent general anaesthesia without difficulty.  The patient was positioned appropriately.  VTE prevention in place.  He was of his prior abdominal surgery and colectomy I had Dr. NMilford Cagewith [facility] to do cystoscopy with firefly ICG infiltration of both ureters for identification.  Please see separate operative note.  The patient was clipped, prepped, & draped in a sterile fashion.  Surgical timeout confirmed our plan.  The patient was positioned in reverse Trendelenburg.  Abdominal entry was gained using Varess technique at the  subcostal ridge on the anterior abdominal wall.  No elevated EtCO2 noted.  Port placed.  Camera inspection revealed no injury.  Extra ports were carefully placed under direct laparoscopic visualization.The patient was carefully positioned.  The [employer] daVinci robot was docked with camera & instruments carefully placed.  Upon entering the abdomen (organ space),we encountered swath of omentum in the left lower quadrant anterior abdominal wall consistent with incarcerated incisional hernia from his far left colectomy with left lower quadrant extraction incision.  We worked to reduce that down.  Freed omental adhesions to the anterior abdominal wall..   I  reflected the greater omentum and the upper abdomen the small bowel in the upper abdomen.    I mobilized the rectosigmoid colon & elevated it to put the main pedicle on tension.  Could see staple line at the rectosigmoid junction consistent with prior sigmoid colectomy for diverticulitis as noted in his past medical surgical history.  I scored the base of peritoneum of the medial side of the mesentery of the elevated left colon from the ligament of Treitz to the mid rectum.   I elevated the sigmoid mesentery and entered into the retro-mesenteric plane. We were able to identify the left ureter and gonadal vessels. We kept those posterior within the retroperitoneum and elevated the left colon mesentery off that.  Was able to find the breach between the colon and rectosigmoid to elevate some of the left colon.  Mesentery was somewhat twisted but eventually was able to straighten out from mobilizing a lateral medial aspect and on twisting some.  I did isolate the inferior mesenteric artery (IMA) pedicle but did not ligate it yet.  I continued distally and got into the avascular plane posterior to the mesorectum, sparing the nervi ergentes.. This allowed me to help mobilize the rectum as well by freeing the mesorectum off the sacrum.  I stayed away from the right and left ureters.  I kept the lateral vascular pedicles to the rectum intact.  I skeletonized the lymph nodes off the inferior mesenteric artery pedicle.  I went down to its takeoff from the aorta.  I isolated the inferior mesenteric vein off of the ligament of Treitz just cephalad to that as well.  After confirming the left ureter was out of the way, I went  ahead and ligated the inferior mesenteric artery pedicle just near its takeoff from the aorta.  I did ligate the inferior mesenteric vein in a similar fashion.  We ensured hemostasis.  I continued medial to lateral dissection to free the left colon mesentery off the retroperitoneum going up towards the  splenic flexure to allow good mobility and protect the colon mesentery.  I mobilized the left colon in a lateral to medial fashion off the retroperitoneum and sidewall attachments along the line of Toldt up towards the splenic flexure to ensure good mobilization of the remaining left colon to reach into the pelvis. Freed the mesorectum off the presacral plane until I was distal to the concerning region.  Freed off peritoneum on the lateral sidewalls as well and transected the mesentery of the lateral pedicles to get distal to the area of concern.  Came around anteriorly such that I had good circumferential mesorectal excision and a good margin distal to the area of concern.   I then focused on mobilizing the splenic flexure of the colon.  Ended up repositioning the patient to be more reverse Trendelenburg.  Found the greater omentum.  He had a large volume that was quite friable and pedunculated and somewhat ischemia extensive fair manage incarcerated in his old incisional hernia.  Eventually freed the greater omentum off the mid transverse colon towards the splenic flexure.  Save some omentum en bloc since it was densely adherent to that region.  Got into the lesser sac and scored the base of the mid transverse and distal transverse colon mesentery off the retroperitoneum along the inferior pancreatic ridge.  Gradually got through the large swath of omentum to get it elevated.  Saw a small accessory spleen nodule.  I believe that went en bloc with the omentum I left adherent to the splenic flexure since and somewhat thickened.  This was a region that was suspicious for the bleeding based on 2 prior bleeding scans.  Then finished mobilizing the descending colon in a lateral to medial fashion and freeing the splenic flexure off the retroperitoneum in a superior to inferior fashion all the way to the left middle colic artery pedicle.  With that mobilization the colon still would not reach down to the pelvis well.   Therefore we elevated the transverse colon mesentery both superiorly and then reflected inferiorly to confirm the remaining pedicles.  Good critical view and transected through the and took to the left middle colic pedicle close to its takeoff in the retroperitoneum and came back more proximally to the right middle colic arterial branch pedicle.  Mobilized some of the hepatic flexure in a superior to inferior fashion to get some length.  With that I felt that we had better mobility.  Dr. Marcello Moores agreed.  To access vascular perfusion of tissues, we asked anesthesia use intravenous  indocyanine green (ICG) with IV flush.  I switched to the NIR fluorescence (Firefly mode) imaging window on the daVinci robot platform.  We were able to see good light green visualization of blood vessels with good vascular perfusion of tissues, confirming good tissue perfusion of tissues mid transverse colon and rectum planned for anastomosis.  I placed a silk stitch at the transverse colon a centimeter proximal to the demarcation by visualization and firefly where it was viable for identification since he had very thickened redundant colon with very thickened omentum and morbid obesity with a lot of dense intra-abdominal adipose tissue.  We then focused back on mesorectal dissection.  We  could easily identify the prior colorectal stapled anastomosis from before.  Chose a region a centimeter distal to that anastomosis.  I skeletonized the mesorectum at the proximal rectum.   Then transected at the distal margin with a robotic stapler 60 mm green load.  We created an extraction incision through a small Pfannenstiel incision in the suprapubic region.  Placed a wound protector.  I was able to eviscerate the colon from the prior rectosigmoid anastomosis all the way into the mid transverse colon.  Eventually found the stitch left colon ending colon out the wound.   I clamped the colon proximal to this area using a reusable pursestringer  device.  Passed a 2-0 Keith needle. I transected at the descending/sigmoid junction with a scalpel. I got healthy bleeding mucosa.  We sent the rectosigmoid colon specimen off to go to pathology.  We sized the colon orifice.  I chose a  EEA anvil stapler system.  We tried to tie the 2-0 Prolene and it snapped so I redid a pursestring with 0 Prolene on the proximal end.  I placed the anvil to the open end of the proximal remaining colon and closed around it using the pursestring.    We did copious irrigation with crystalloid solution.  Hemostasis was good.  The distal end of the remaining colon was able to reach down to the rectal stump with minimal tension.  Fortunately the rectum was floppy and stretched up into the lower abdomen.  Was able to eviscerate a large swath of greater omentum.  It did not seem very viable and we did firefly and it had only a few small pedicles of blood supply to most most of the remaining greater omentum.  A decent mount had been up in the prior hernia.  Therefore we proceeded to do omentectomy to take the few pedicles and leave the remaining 20% that looked much more viable and healthy.  Did that with clamps and silk tie.    Repositioned the patient and did diagnostic laparoscopy.  The anvil of the proximal mid transverse colon reached down with little bit of tension.  I was able to run the small bowel and get it out underneath the mesentery.  With that the colon laid much more flat and easily.  We do not feel like we had to do a  on the remaining colon.  Rectal stump came up well.  Therefore we held off on completion colectomy or counterclockwise Delorme like rotation.      Dr Marcello Moores scrubbed down and did gentle anal dilation and advanced the EEA stapler up the rectal stump. The spike was brought out at the provimal end of the rectal stump under direct visualization.  I  attached the anvil of the proximal colon the spike of the stapler. Anvil was tightened down and held clamped for  60 seconds.  Orientation was confirmed such that there is no twisting of the colon nor small bowel underneath the mesenteric defect. No concerning tension.  The EEA stapler was fired and held clamped for 30 seconds. The stapler was released & removed. Blue stitch is in the proximal ring.  Care was taken to ensure no other structures were incorporated within this either.  We noted 2 excellent anastomotic rings.   The colon proximal to the anastomosis was then gently occluded. The pelvis was filled with sterile irrigation.  Dr Marcello Moores  did rigid proctoscopy noted the anastomosis was at 15 cm from the anal verge consistent with the proximal rectum.  There was a negative air leak test. There was no tension of mesentery or bowel at the anastomosis.   Tissues looked viable.  Ureters & bowel uninjured.  The anastomosis looked healthy.  I primarily repaired his prior incisional hernia using #1 PDS using laparoscopic suture passer interrupted to transversely reapproximate the hernia in the left lower quadrant.  Did not want to do mesh in the case of a colectomy.  Tissues came together well without tension.  Did reinspection.  A small viable patch of omentum was able to be positioned down to help protect the anastomosis.  Endoluminal gas was evacuated.  Ports & wound protector removed.  We changed gloves & redraped the patient per colon SSI prevention protocol.  We aspirated the sterile irrigation.  Hemostasis was good.  Sterile unused instruments were used from this point.  I closed the skin at the port sites using Monocryl stitch and sterile dressing.  We assured hemostasis and the former ostomy wound.  Wound irrigated.  I closed the posterior rectus fascia with 0 Vicryl suture.  Anterior rectus fascia was closed using #1 PDS transversely.   Sterile dressing placed.   Patient is being extubated go to recovery room. I had discussed postop care with the patient in detail the office & in the holding area. Instructions are  written. I discussed operative findings, updated the patient's status, discussed probable steps to recovery, and gave postoperative recommendations to the patient's spouse, Lashone Loduca .  Recommendations were made.  Questions were answered.  She expressed understanding & appreciation.  Adin Hector, M.D., F.A.C.S. Gastrointestinal and Minimally Invasive Surgery [facility] [address], [place],  [address] [phone] Main / Paging

## 2022-07-18 NOTE — Interval H&P Note (Signed)
History and Physical Interval Note:  07/18/2022 7:10 AM  Neil Mcintyre  has presented today for surgery, with the diagnosis of COLON BLEEDING AT SPLENIC FLEXURE.  The various methods of treatment have been discussed with the patient and family. After consideration of risks, benefits and other options for treatment, the patient has consented to  Procedure(s) with comments: ROBOTIC RESECTION OF COLON, SPLENIC FLEXURE (N/A) - GEN w/ERAS PATHWAY EXPEREL RIGID PROCTOSCOPY (N/A) CYSTOSCOPY with FIREFLY INJECTION (Bilateral) as a surgical intervention.  The patient's history has been reviewed, patient examined, no change in status, stable for surgery.  I have reviewed the patient's chart and labs.  Questions were answered to the patient's satisfaction.    I have re-reviewed the the patient's records, history, medications, and allergies.  I have re-examined the patient.  I again discussed intraoperative plans and goals of post-operative recovery.  The patient agrees to proceed.  Neil Mcintyre  1976-01-18 [id number]  Patient Care Team: Ria Bush, MD as PCP - General (Family Medicine) Michael Boston, MD as Consulting Physician (General Surgery) Annamaria Helling, DO as Consulting Physician (Gastroenterology)  Patient Active Problem List   Diagnosis Date Noted   Vitamin B12 deficiency 06/20/2022   Acute blood loss anemia 06/12/2022   Iron deficiency anemia 06/12/2022   Hypokalemia 06/09/2022   Lower GI bleed 06/09/2022   Left-sided headache 02/25/2022   Seasonal allergic rhinitis 09/07/2019   Enlargement of right palatine tonsil 09/07/2019   Genital herpes    Weak urinary stream 09/04/2017   Mood disorder ([place]) 02/21/2017   Health maintenance examination 10/30/2016   Type 2 diabetes mellitus with other specified complication ([place]) [id number]   Dyslipidemia 10/30/2016   Obesity, morbid, BMI 40.0-49.9 ([place]) 07/29/2016   Diverticulitis of large intestine with perforation with  bleeding    Anxiety 02/21/2015   Primary hypertension 02/21/2015    Past Medical History:  Diagnosis Date   Anxiety    COVID-19 05/10/2021   Depression    Diabetes mellitus without complication ([place])    Diverticulitis of large intestine with perforation with bleeding 2015, 2017   Laparoscopic sigmoid colectomy 2017   Genital herpes    Hypertension    Irritability and anger    due to stress    Past Surgical History:  Procedure Laterality Date   COLONOSCOPY W/ POLYPECTOMY  09/2014   diverticulosis ([place])   COLONOSCOPY WITH PROPOFOL N/A 06/11/2022   2 small polyps not removed, diverticulosis, healthy anastomosis, non-bleeding int hem [name], [name])   CYSTOSCOPY W/ RETROGRADES Bilateral 08/16/2015   [name], MD   CYSTOSCOPY WITH STENT PLACEMENT Bilateral 08/16/2015   [name], MD   LAPAROSCOPIC SIGMOID COLECTOMY N/A 08/16/2015   recurrent diverticulitis; [name], MD   REFRACTIVE SURGERY  1999   LASIK    Social History   Socioeconomic History   Marital status: Married    Spouse name: Not on file   Number of children: Not on file   Years of education: Not on file   Highest education level: Not on file  Occupational History   Not on file  Tobacco Use   Smoking status: Never   Smokeless tobacco: Former    Types: Chew, Snuff  Vaping Use   Vaping Use: Former  Substance and Sexual Activity   Alcohol use: Yes    Alcohol/week: 1.0 standard drink of alcohol    Types: 1 Cans of beer per week    Comment: socially   Drug use: No  Comment: some in HS   Sexual activity: Yes    Birth control/protection: None  Other Topics Concern   Not on file  Social History Narrative   Lives with wife, 2 children and mother   Occ: Leisure centre manager guard   Edu: tech school   Activity: active at work   Diet: on keto diet   Social Determinants of Health   Financial Resource Strain: Not on file  Food Insecurity: No Food Insecurity (06/09/2022)   Hunger Vital  Sign    Worried About Running Out of Food in the Last Year: Never true    Ran Out of Food in the Last Year: Never true  Transportation Needs: No Transportation Needs (06/09/2022)   PRAPARE - Hydrologist (Medical): No    Lack of Transportation (Non-Medical): No  Physical Activity: Not on file  Stress: Not on file  Social Connections: Not on file  Intimate Partner Violence: Not At Risk (06/09/2022)   Humiliation, Afraid, Rape, and Kick questionnaire    Fear of Current or Ex-Partner: No    Emotionally Abused: No    Physically Abused: No    Sexually Abused: No    Family History  Adopted: Yes  Family history unknown: Yes    Medications Prior to Admission  Medication Sig Dispense Refill Last Dose   amLODipine (NORVASC) 5 MG tablet Take 1 tablet (5 mg total) by mouth daily. 90 tablet 1 07/17/2022   buPROPion (WELLBUTRIN XL) 150 MG 24 hr tablet Take 1 tablet (150 mg total) by mouth daily. 90 tablet 1 07/17/2022   [employer] PURELAX 17 GM/SCOOP powder Take 238 g by mouth as directed.   Past Week   cyanocobalamin (VITAMIN B12) 500 MCG tablet Take 1 tablet (500 mcg total) by mouth daily.   07/17/2022   metoprolol succinate (TOPROL-XL) 25 MG 24 hr tablet Take 1 tablet (25 mg total) by mouth daily. 90 tablet 1 07/17/2022   metroNIDAZOLE (FLAGYL) 500 MG tablet Take 1,000 mg by mouth as directed.   07/17/2022   neomycin (MYCIFRADIN) 500 MG tablet Take 1,000 mg by mouth 3 (three) times daily.   07/17/2022   senna-docusate (SENOKOT-S) 8.6-50 MG tablet Take 1 tablet by mouth 2 (two) times daily. 100 tablet 0 07/17/2022   valACYclovir (VALTREX) 500 MG tablet Take 1 tablet (500 mg total) by mouth daily. 90 tablet 1 07/17/2022   ferrous sulfate 325 (65 FE) MG tablet Take 1 tablet (325 mg total) by mouth daily. (Patient not taking: Reported on 07/08/2022) 30 tablet 0 Not Taking    Current Facility-Administered Medications  Medication Dose Route Frequency Provider Last Rate Last Admin    bupivacaine liposome (EXPAREL) 1.3 % injection 266 mg  20 mL Infiltration Once Michael Boston, MD       cefoTEtan (CEFOTAN) 2 g in sodium chloride 0.9 % 100 mL IVPB  2 g Intravenous On Call to OR Michael Boston, MD       lactated ringers infusion   Intravenous Continuous Lynda Rainwater, MD         No Known Allergies  BP (!) 159/90   Pulse (!) 107   Temp 97.8 F (36.6 C) (Oral)   Resp 16   Ht 5' 7.5" (1.715 m)   Wt 117.9 kg   BMI 40.12 kg/m   Labs: Results for orders placed or performed during the hospital encounter of 07/18/22 (from the past 48 hour(s))  Glucose, capillary     Status: Abnormal  Collection Time: 07/18/22  5:46 AM  Result Value Ref Range   Glucose-Capillary 102 (H) 70 - 99 mg/dL    Comment: Glucose reference range applies only to samples taken after fasting for at least 8 hours.   Comment 1 Notify RN    Comment 2 Document in Chart     Imaging / Studies: No results found.   Adin Hector, M.D., F.A.C.S. Gastrointestinal and Minimally Invasive Surgery [facility] [address], [place], [place] [address] [phone] Main / Paging  07/18/2022 7:10 AM    Adin Hector

## 2022-07-18 NOTE — Consult Note (Signed)
WOC consult requested for presurgical stoma site marking.  This was already performed on 2/7; please refer to progress notes for measurements of the locations. [place] team will follow after surgery if an ostomy is performed. Thank-you,  Julien Girt MSN, [place], [place], [place], [place]

## 2022-07-18 NOTE — Consult Note (Signed)
Urology Consult   Physician requesting consult: Gross  Reason for consult: Placement of firefly injection for intraoperative identification of ureters  History of Present Illness: Neil Mcintyre is a 47 y.o. male with history of diverticular disease and GI bleed.  He is scheduled for elective colon resection today.  I have been asked to inject firefly in the ureter for intraoperative and identification.  He denies a history of voiding or storage urinary symptoms, hematuria, UTIs, STDs, urolithiasis, GU malignancy/trauma/surgery.  Past Medical History:  Diagnosis Date   Anxiety    COVID-19 05/10/2021   Depression    Diabetes mellitus without complication ([place])    Diverticulitis of large intestine with perforation with bleeding 2015, 2017   Laparoscopic sigmoid colectomy 2017   Genital herpes    Hypertension    Irritability and anger    due to stress    Past Surgical History:  Procedure Laterality Date   COLONOSCOPY W/ POLYPECTOMY  09/2014   diverticulosis ([place])   COLONOSCOPY WITH PROPOFOL N/A 06/11/2022   2 small polyps not removed, diverticulosis, healthy anastomosis, non-bleeding int hem [name], [name])   CYSTOSCOPY W/ RETROGRADES Bilateral 08/16/2015   [name], MD   CYSTOSCOPY WITH STENT PLACEMENT Bilateral 08/16/2015   [name], MD   LAPAROSCOPIC SIGMOID COLECTOMY N/A 08/16/2015   recurrent diverticulitis; [name], MD   [facility] Medications:  Home Meds:  No current facility-administered medications on file prior to encounter.   Current Outpatient Medications on File Prior to Encounter  Medication Sig Dispense Refill   amLODipine (NORVASC) 5 MG tablet Take 1 tablet (5 mg total) by mouth daily. 90 tablet 1   buPROPion (WELLBUTRIN XL) 150 MG 24 hr tablet Take 1 tablet (150 mg total) by mouth daily. 90 tablet 1   [employer] PURELAX 17 GM/SCOOP powder Take 238 g by mouth as directed.     cyanocobalamin  (VITAMIN B12) 500 MCG tablet Take 1 tablet (500 mcg total) by mouth daily.     metoprolol succinate (TOPROL-XL) 25 MG 24 hr tablet Take 1 tablet (25 mg total) by mouth daily. 90 tablet 1   metroNIDAZOLE (FLAGYL) 500 MG tablet Take 1,000 mg by mouth as directed.     neomycin (MYCIFRADIN) 500 MG tablet Take 1,000 mg by mouth 3 (three) times daily.     senna-docusate (SENOKOT-S) 8.6-50 MG tablet Take 1 tablet by mouth 2 (two) times daily. 100 tablet 0   valACYclovir (VALTREX) 500 MG tablet Take 1 tablet (500 mg total) by mouth daily. 90 tablet 1   ferrous sulfate 325 (65 FE) MG tablet Take 1 tablet (325 mg total) by mouth daily. (Patient not taking: Reported on 07/08/2022) 30 tablet 0     Scheduled Meds:  bupivacaine liposome  20 mL Infiltration Once   Continuous Infusions:  cefoTEtan (CEFOTAN) IV     lactated ringers     PRN Meds:.  Allergies: No Known Allergies  Family History  Adopted: Yes  Family history unknown: Yes    Social History:  reports that he has never smoked. He has quit using smokeless tobacco.  His smokeless tobacco use included chew and snuff. He reports current alcohol use of about 1.0 standard drink of alcohol per week. He reports that he does not use drugs.  ROS: A complete review of systems was performed.  All systems are negative except for pertinent findings as noted.  Physical Exam:  Vital signs in last  24 hours: Temp:  [97.8 F (36.6 C)] 97.8 F (36.6 C) (02/15 0538) Pulse Rate:  [107] 107 (02/15 0538) Resp:  [16] 16 (02/15 0538) BP: (159)/(90) 159/90 (02/15 0538) Weight:  [117.9 kg] 117.9 kg (02/15 0547) Constitutional:  Alert and oriented, No acute distress Cardiovascular: Regular rate and rhythm, No JVD Respiratory: Normal respiratory effort, Lungs clear bilaterally Laboratory Data:  No results for input(s): "WBC", "HGB", "HCT", "PLT" in the last 72 hours.  No results for input(s): "NA", "K", "CL", "GLUCOSE", "BUN", "CALCIUM", "CREATININE" in the  last 72 hours.  Invalid input(s): "CO3"   Results for orders placed or performed during the hospital encounter of 07/18/22 (from the past 24 hour(s))  Glucose, capillary     Status: Abnormal   Collection Time: 07/18/22  5:46 AM  Result Value Ref Range   Glucose-Capillary 102 (H) 70 - 99 mg/dL   Comment 1 Notify RN    Comment 2 Document in Chart    No results found for this or any previous visit (from the past 240 hour(s)).  Renal Function: No results for input(s): "CREATININE" in the last 168 hours. Estimated Creatinine Clearance: 144.3 mL/min (by C-G formula based on SCr of 0.8 mg/dL).  Radiologic Imaging: No results found.  I independently reviewed the above imaging studies.  Impression/Recommendation 1.  History of GI bleed scheduled for elective colon resection Plan/recommendation.  Scheduled for colon resection aspect of the patient about procedure of injection of firefly into the ureters or intraoperative identification.  Risk and benefits discussed in detail.  He is agreeable plan to proceed.  Remi Haggard 07/18/2022, 7:23 AM    Remi Haggard, MD   CC:

## 2022-07-19 ENCOUNTER — Encounter (HOSPITAL_COMMUNITY): Payer: Self-pay | Admitting: Surgery

## 2022-07-19 LAB — CBC
HCT: 35.2 % — ABNORMAL LOW (ref 39.0–52.0)
Hemoglobin: 11 g/dL — ABNORMAL LOW (ref 13.0–17.0)
MCH: 26.9 pg (ref 26.0–34.0)
MCHC: 31.3 g/dL (ref 30.0–36.0)
MCV: 86.1 fL (ref 80.0–100.0)
Platelets: 348 10*3/uL (ref 150–400)
RBC: 4.09 MIL/uL — ABNORMAL LOW (ref 4.22–5.81)
RDW: 16 % — ABNORMAL HIGH (ref 11.5–15.5)
WBC: 14 10*3/uL — ABNORMAL HIGH (ref 4.0–10.5)
nRBC: 0 % (ref 0.0–0.2)

## 2022-07-19 LAB — BASIC METABOLIC PANEL
Anion gap: 8 (ref 5–15)
BUN: 14 mg/dL (ref 6–20)
CO2: 24 mmol/L (ref 22–32)
Calcium: 8.4 mg/dL — ABNORMAL LOW (ref 8.9–10.3)
Chloride: 101 mmol/L (ref 98–111)
Creatinine, Ser: 1.04 mg/dL (ref 0.61–1.24)
GFR, Estimated: >60 mL/min (ref >60)
Glucose, Bld: 121 mg/dL — ABNORMAL HIGH (ref 70–99)
Potassium: 3.8 mmol/L (ref 3.5–5.1)
Sodium: 133 mmol/L — ABNORMAL LOW (ref 135–145)

## 2022-07-19 LAB — MAGNESIUM: Magnesium: 1.9 mg/dL (ref 1.7–2.4)

## 2022-07-19 NOTE — TOC CM/SW Note (Signed)
  Transition of Care [facility]) Screening Note   Patient Details  Name: Neil Mcintyre Date of Birth: 12-20-1975   Transition of Care [facility]) CM/SW Contact:    Lennart Pall, LCSW Phone Number: 07/19/2022, 10:34 AM    Transition of Care Department [facility]) has reviewed patient and no TOC needs have been identified at this time. We will continue to monitor patient advancement through interdisciplinary progression rounds. If new patient transition needs arise, please place a TOC consult.

## 2022-07-19 NOTE — Consult Note (Signed)
[facility] Nurse ostomy consult note: Pt did not receive an ostomy yesterday. Surgical team following for assessment and plan of care; no further role for [place]. Please re-consult if further assistance is needed.  Thank-you,  Julien Girt MSN, [place], [place], [place], [place]

## 2022-07-19 NOTE — Anesthesia Postprocedure Evaluation (Signed)
Anesthesia Post Note  Patient: Neil Mcintyre  Procedure(s) Performed: ROBOTIC RESECTION OF COLON, SPLENIC FLEXURE, Incarcerated incisional hernia repair RIGID PROCTOSCOPY CYSTOSCOPY, bilateral retrogrades with FIREFLY INJECTION (Bilateral)     Patient location during evaluation: PACU Anesthesia Type: General Level of consciousness: sedated and patient cooperative Pain management: pain level controlled Vital Signs Assessment: post-procedure vital signs reviewed and stable Respiratory status: spontaneous breathing Cardiovascular status: stable Anesthetic complications: no  No notable events documented.  Last Vitals:  Vitals:   07/19/22 0214 07/19/22 0500  BP: 129/79 130/86  Pulse: 87 94  Resp: 17 18  Temp: 37.1 C 37 C  SpO2: 97% 97%    Last Pain:  Vitals:   07/19/22 0610  TempSrc:   PainSc: [place]

## 2022-07-19 NOTE — Progress Notes (Signed)
1 Day Post-Op Robotic L colectomy Subjective: Feels a bit sore, no nausea, no flatus or BM  Objective: Vital signs in last 24 hours: Temp:  [97.5 F (36.4 C)-98.7 F (37.1 C)] 98.6 F (37 C) (02/16 0500) Pulse Rate:  [83-101] 94 (02/16 0500) Resp:  [14-20] 18 (02/16 0500) BP: (126-152)/(79-98) 130/86 (02/16 0500) SpO2:  [92 %-99 %] 97 % (02/16 0500)   Intake/Output from previous day: 02/15 0701 - 02/16 0700 In: 1609.6 [I.V.:1609.6] Out: 1075 [Urine:975; Blood:100] Intake/Output this shift: No intake/output data recorded.   General appearance: alert and cooperative GI: soft  Incision: no significant drainage  Lab Results:  Recent Labs    07/19/22 0415  WBC 14.0*  HGB 11.0*  HCT 35.2*  PLT 348   BMET Recent Labs    07/19/22 0415  NA 133*  K 3.8  CL 101  CO2 24  GLUCOSE 121*  BUN 14  CREATININE 1.04  CALCIUM 8.4*   PT/INR No results for input(s): "LABPROT", "INR" in the last 72 hours. ABG No results for input(s): "PHART", "HCO3" in the last 72 hours.  Invalid input(s): "PCO2", "PO2"  MEDS, Scheduled  acetaminophen  1,000 mg Oral Q6H   alvimopan  12 mg Oral BID   buPROPion  150 mg Oral Daily   cyanocobalamin  500 mcg Oral Daily   enoxaparin (LOVENOX) injection  40 mg Subcutaneous Q24H   feeding supplement  237 mL Oral BID BM   lip balm   Topical BID   metoprolol succinate  25 mg Oral Daily   sodium chloride flush  3 mL Intravenous Q12H   valACYclovir  500 mg Oral Daily    Studies/Results: DG C-Arm 1-60 Min-No Report  Result Date: 07/18/2022 Fluoroscopy was utilized by the requesting physician.  No radiographic interpretation.    Assessment: s/p Procedure(s): ROBOTIC RESECTION OF COLON, SPLENIC FLEXURE, Incarcerated incisional hernia repair RIGID PROCTOSCOPY CYSTOSCOPY, bilateral retrogrades with FIREFLY INJECTION Patient Active Problem List   Diagnosis Date Noted   Diverticulitis of colon with bleeding 07/18/2022   Vitamin B12 deficiency  06/20/2022   Acute blood loss anemia 06/12/2022   Iron deficiency anemia 06/12/2022   Hypokalemia 06/09/2022   Lower GI bleed 06/09/2022   Left-sided headache 02/25/2022   Seasonal allergic rhinitis 09/07/2019   Enlargement of right palatine tonsil 09/07/2019   Genital herpes    Weak urinary stream 09/04/2017   Mood disorder ([place]) 02/21/2017   Health maintenance examination 10/30/2016   Type 2 diabetes mellitus with other specified complication ([place]) [id number]   Dyslipidemia 10/30/2016   Obesity, morbid, BMI 40.0-49.9 ([place]) 07/29/2016   Diverticulitis of large intestine with perforation with bleeding    Anxiety 02/21/2015   Primary hypertension 02/21/2015    Expected post op course  Plan: d/c foley Advance diet Ambulate in hall   LOS: 1 day     .Rosario Adie, [facility], [place]    07/19/2022 7:35 AM

## 2022-07-20 LAB — BASIC METABOLIC PANEL
Anion gap: 8 (ref 5–15)
BUN: 13 mg/dL (ref 6–20)
CO2: 26 mmol/L (ref 22–32)
Calcium: 8.2 mg/dL — ABNORMAL LOW (ref 8.9–10.3)
Chloride: 98 mmol/L (ref 98–111)
Creatinine, Ser: 0.91 mg/dL (ref 0.61–1.24)
GFR, Estimated: >60 mL/min (ref >60)
Glucose, Bld: 100 mg/dL — ABNORMAL HIGH (ref 70–99)
Potassium: 3.5 mmol/L (ref 3.5–5.1)
Sodium: 132 mmol/L — ABNORMAL LOW (ref 135–145)

## 2022-07-20 LAB — CBC
HCT: 32.7 % — ABNORMAL LOW (ref 39.0–52.0)
Hemoglobin: 10.4 g/dL — ABNORMAL LOW (ref 13.0–17.0)
MCH: 27.8 pg (ref 26.0–34.0)
MCHC: 31.8 g/dL (ref 30.0–36.0)
MCV: 87.4 fL (ref 80.0–100.0)
Platelets: 316 10*3/uL (ref 150–400)
RBC: 3.74 MIL/uL — ABNORMAL LOW (ref 4.22–5.81)
RDW: 16.1 % — ABNORMAL HIGH (ref 11.5–15.5)
WBC: 15.5 10*3/uL — ABNORMAL HIGH (ref 4.0–10.5)
nRBC: 0 % (ref 0.0–0.2)

## 2022-07-20 MED ORDER — HYDROMORPHONE HCL 1 MG/ML IJ SOLN
0.5000 mg | INTRAMUSCULAR | Status: DC | PRN
  Administered 2022-07-20: 0.5 mg via INTRAVENOUS
  Filled 2022-07-20 (×2): qty 0.5

## 2022-07-20 MED ORDER — OXYCODONE HCL 5 MG PO TABS
5.0000 mg | ORAL_TABLET | ORAL | Status: DC | PRN
  Administered 2022-07-20 – 2022-07-21 (×3): 5 mg via ORAL
  Filled 2022-07-20 (×3): qty 1

## 2022-07-20 NOTE — Plan of Care (Signed)
  Problem: Education: Goal: Understanding of discharge needs will improve Outcome: Progressing   Problem: Bowel/Gastric: Goal: Gastrointestinal status for postoperative course will improve Outcome: Progressing   Problem: Clinical Measurements: Goal: Ability to maintain clinical measurements within normal limits will improve Outcome: Progressing

## 2022-07-20 NOTE — Progress Notes (Signed)
2 Days Post-Op   Subjective/Chief Complaint: Having loose stool, passed a lot of flatus, has oxygen on at night due to what he says is osa (no study), tol diet, ambulating, voiding, still using iv pain meds   Objective: Vital signs in last 24 hours: Temp:  [98.1 F (36.7 C)-98.8 F (37.1 C)] 98.8 F (37.1 C) (02/17 0525) Pulse Rate:  [81-93] 93 (02/17 0525) Resp:  [16-18] 16 (02/17 0525) BP: (131-143)/(79-91) 143/86 (02/17 0525) SpO2:  [92 %-97 %] 96 % (02/17 0525) Last BM Date : 07/19/22  Intake/Output from previous day: No intake/output data recorded. Intake/Output this shift: No intake/output data recorded.  Ab soft approp tender incisions clean  Lab Results:  Recent Labs    07/19/22 0415 07/20/22 0324  WBC 14.0* 15.5*  HGB 11.0* 10.4*  HCT 35.2* 32.7*  PLT 348 316   BMET Recent Labs    07/19/22 0415 07/20/22 0324  NA 133* 132*  K 3.8 3.5  CL 101 98  CO2 24 26  GLUCOSE 121* 100*  BUN 14 13  CREATININE 1.04 0.91  CALCIUM 8.4* 8.2*   PT/INR No results for input(s): "LABPROT", "INR" in the last 72 hours. ABG No results for input(s): "PHART", "HCO3" in the last 72 hours.  Invalid input(s): "PCO2", "PO2"  Studies/Results: DG C-Arm 1-60 Min-No Report  Result Date: 07/18/2022 Fluoroscopy was utilized by the requesting physician.  No radiographic interpretation.    Anti-infectives: Anti-infectives (From admission, onward)    Start     Dose/Rate Route Frequency Ordered Stop   07/18/22 2000  cefoTEtan (CEFOTAN) 2 g in sodium chloride 0.9 % 100 mL IVPB        2 g 200 mL/hr over 30 Minutes Intravenous Every 12 hours 07/18/22 1546 07/18/22 2121   07/18/22 1800  valACYclovir (VALTREX) tablet 500 mg        500 mg Oral Daily 07/18/22 1546     07/18/22 1400  neomycin (MYCIFRADIN) tablet 1,000 mg  Status:  Discontinued       See Hyperspace for full Linked Orders Report.   1,000 mg Oral 3 times per day 07/18/22 0537 07/18/22 0545   07/18/22 1400  metroNIDAZOLE  (FLAGYL) tablet 1,000 mg  Status:  Discontinued       See Hyperspace for full Linked Orders Report.   1,000 mg Oral 3 times per day 07/18/22 0537 07/18/22 0545   07/18/22 0600  cefoTEtan (CEFOTAN) 2 g in sodium chloride 0.9 % 100 mL IVPB        2 g 200 mL/hr over 30 Minutes Intravenous On call to O.R. 07/18/22 0537 07/18/22 1842       Assessment/Plan: POD 2 left colectomy- SG -soft diet -pulm toilet, oob -recommended he get a sleep study if thinks he has OSA -will stay another 24 hours for pain control, hopefully dc in am  Rolm Bookbinder 07/20/2022

## 2022-07-21 LAB — CBC
HCT: 33.2 % — ABNORMAL LOW (ref 39.0–52.0)
Hemoglobin: 10.2 g/dL — ABNORMAL LOW (ref 13.0–17.0)
MCH: 26.8 pg (ref 26.0–34.0)
MCHC: 30.7 g/dL (ref 30.0–36.0)
MCV: 87.4 fL (ref 80.0–100.0)
Platelets: 329 10*3/uL (ref 150–400)
RBC: 3.8 MIL/uL — ABNORMAL LOW (ref 4.22–5.81)
RDW: 16.3 % — ABNORMAL HIGH (ref 11.5–15.5)
WBC: 20.5 10*3/uL — ABNORMAL HIGH (ref 4.0–10.5)
nRBC: 0 % (ref 0.0–0.2)

## 2022-07-21 LAB — BASIC METABOLIC PANEL
Anion gap: 8 (ref 5–15)
BUN: 10 mg/dL (ref 6–20)
CO2: 26 mmol/L (ref 22–32)
Calcium: 8.4 mg/dL — ABNORMAL LOW (ref 8.9–10.3)
Chloride: 100 mmol/L (ref 98–111)
Creatinine, Ser: 0.76 mg/dL (ref 0.61–1.24)
GFR, Estimated: >60 mL/min (ref >60)
Glucose, Bld: 106 mg/dL — ABNORMAL HIGH (ref 70–99)
Potassium: 3.3 mmol/L — ABNORMAL LOW (ref 3.5–5.1)
Sodium: 134 mmol/L — ABNORMAL LOW (ref 135–145)

## 2022-07-21 MED ORDER — METHOCARBAMOL 500 MG PO TABS
1000.0000 mg | ORAL_TABLET | Freq: Four times a day (QID) | ORAL | Status: DC
  Administered 2022-07-21 – 2022-07-22 (×5): 1000 mg via ORAL
  Filled 2022-07-21 (×5): qty 2

## 2022-07-21 MED ORDER — OXYCODONE HCL 5 MG PO TABS
5.0000 mg | ORAL_TABLET | ORAL | Status: DC | PRN
  Administered 2022-07-21 – 2022-07-22 (×3): 5 mg via ORAL
  Administered 2022-07-22: 10 mg via ORAL
  Filled 2022-07-21: qty 1
  Filled 2022-07-21: qty 2
  Filled 2022-07-21 (×2): qty 1

## 2022-07-21 NOTE — Progress Notes (Signed)
3 Days Post-Op   Subjective/Chief Complaint: Still with flatus/loose stools. Still with pain not well controlled, ambulating tol diet on oxygen at night per orders   Objective: Vital signs in last 24 hours: Temp:  [98.1 F (36.7 C)-99.1 F (37.3 C)] 99.1 F (37.3 C) (02/18 0616) Pulse Rate:  [93-109] 108 (02/18 0616) Resp:  [18] 18 (02/18 0616) BP: (132-152)/(83-90) 145/88 (02/18 0616) SpO2:  [92 %-94 %] 92 % (02/18 0616) Weight:  [120.4 kg] 120.4 kg (02/18 0500) Last BM Date : 07/19/22  Intake/Output from previous day: 02/17 0701 - 02/18 0700 In: 250 [P.O.:250] Out: -  Intake/Output this shift: No intake/output data recorded.  General nad Cv mild tachycardia Pulm effort normal Ab soft approp tender incisions clean  Lab Results:  Recent Labs    07/20/22 0324 07/21/22 0306  WBC 15.5* 20.5*  HGB 10.4* 10.2*  HCT 32.7* 33.2*  PLT 316 329   BMET Recent Labs    07/20/22 0324 07/21/22 0306  NA 132* 134*  K 3.5 3.3*  CL 98 100  CO2 26 26  GLUCOSE 100* 106*  BUN 13 10  CREATININE 0.91 0.76  CALCIUM 8.2* 8.4*   PT/INR No results for input(s): "LABPROT", "INR" in the last 72 hours. ABG No results for input(s): "PHART", "HCO3" in the last 72 hours.  Invalid input(s): "PCO2", "PO2"  Studies/Results: No results found.  Anti-infectives: Anti-infectives (From admission, onward)    Start     Dose/Rate Route Frequency Ordered Stop   07/18/22 2000  cefoTEtan (CEFOTAN) 2 g in sodium chloride 0.9 % 100 mL IVPB        2 g 200 mL/hr over 30 Minutes Intravenous Every 12 hours 07/18/22 1546 07/18/22 2121   07/18/22 1800  valACYclovir (VALTREX) tablet 500 mg        500 mg Oral Daily 07/18/22 1546     07/18/22 1400  neomycin (MYCIFRADIN) tablet 1,000 mg  Status:  Discontinued       See Hyperspace for full Linked Orders Report.   1,000 mg Oral 3 times per day 07/18/22 0537 07/18/22 0545   07/18/22 1400  metroNIDAZOLE (FLAGYL) tablet 1,000 mg  Status:  Discontinued        See Hyperspace for full Linked Orders Report.   1,000 mg Oral 3 times per day 07/18/22 0537 07/18/22 0545   07/18/22 0600  cefoTEtan (CEFOTAN) 2 g in sodium chloride 0.9 % 100 mL IVPB        2 g 200 mL/hr over 30 Minutes Intravenous On call to O.R. 07/18/22 0537 07/18/22 1842       Assessment/Plan: POD 3 left colectomy -soft diet -pulm toilet, ambulate -recommended sleep study as outpatient -wbc up a little, low grade tachy no other concerning findings but will keep another day and recheck -adjusting pain medication -lovenox, scds    Rolm Bookbinder 07/21/2022

## 2022-07-21 NOTE — Progress Notes (Signed)
MEWS Progress Note  Patient Details Name: Neil Mcintyre MRN: [id number] DOB: 1976-04-28 Today's Date: 07/21/2022   MEWS Flowsheet Documentation:  Assess: MEWS Score Temp: (!) 97.5 F (36.4 C) BP: (!) 150/84 MAP (mmHg): 101 Pulse Rate: (!) 119 ECG Heart Rate: 94 Resp: 15 Level of Consciousness: (P) Alert SpO2: 94 % O2 Device: Room Air Patient Activity (if Appropriate): (P) In bed O2 Flow Rate (L/min): (P) 0 L/min Assess: MEWS Score MEWS Temp: (P) 0 MEWS Systolic: (P) 0 MEWS Pulse: (P) 2 MEWS RR: (P) 0 MEWS LOC: (P) 0 MEWS Score: (P) 2 MEWS Score Color: Yellow Assess: SIRS CRITERIA SIRS Temperature : 0 SIRS Respirations : 0 SIRS Pulse: 1 SIRS WBC: 0 SIRS Score Sum : 1 SIRS Temperature : 0 SIRS Pulse: 1 SIRS Respirations : 0 SIRS WBC: 0 SIRS Score Sum : 1 Assess: if the MEWS score is Yellow or Red Were vital signs taken at a resting state?: (P) Yes Focused Assessment: (P) No change from prior assessment Does the patient meet 2 or more of the SIRS criteria?: (P) No MEWS guidelines implemented : (P) Yes, yellow Treat MEWS Interventions: (P) Considered administering scheduled or prn medications/treatments as ordered Take Vital Signs Increase Vital Sign Frequency : (P) Yellow: Q2hr x1, continue Q4hrs until patient remains green for 12hrs Escalate MEWS: Escalate: (P) Yellow: Discuss with charge nurse and consider notifying provider and/or RRT Notify: Charge Nurse/RN Name of Charge Nurse/RN Notified: Mamie Nick) Colletta [place], RN      Lise Auer 07/21/2022, 2:19 PM

## 2022-07-21 NOTE — Plan of Care (Signed)
  Problem: Bowel/Gastric: Goal: Gastrointestinal status for postoperative course will improve Outcome: Progressing   Problem: Elimination: Goal: Will not experience complications related to bowel motility Outcome: Progressing   Problem: Pain Managment: Goal: General experience of comfort will improve Outcome: Progressing

## 2022-07-22 ENCOUNTER — Other Ambulatory Visit (HOSPITAL_COMMUNITY): Payer: Self-pay

## 2022-07-22 LAB — SURGICAL PATHOLOGY

## 2022-07-22 MED ORDER — CALCIUM POLYCARBOPHIL 625 MG PO TABS
625.0000 mg | ORAL_TABLET | Freq: Two times a day (BID) | ORAL | Status: DC
  Administered 2022-07-22: 625 mg via ORAL
  Filled 2022-07-22: qty 1

## 2022-07-22 MED ORDER — OXYCODONE HCL 5 MG PO TABS
5.0000 mg | ORAL_TABLET | Freq: Four times a day (QID) | ORAL | 0 refills | Status: DC | PRN
  Filled 2022-07-22: qty 30, 4d supply, fill #0

## 2022-07-22 NOTE — Discharge Summary (Signed)
Physician Discharge Summary    Patient ID: Neil Mcintyre MRN: [id number] DOB/AGE: 07-Mar-1976  47 y.o.  Patient Care Team: Ria Bush, MD as PCP - General (Family Medicine) Michael Boston, MD as Consulting Physician (General Surgery) Annamaria Helling, DO as Consulting Physician (Gastroenterology)  Admit date: 07/18/2022  Discharge date: 07/22/2022  Hospital Stay = 4 days    Discharge Diagnoses:  Principal Problem:   Diverticulitis of colon with bleeding   4 Days Post-Op  07/18/2022  POST-OPERATIVE DIAGNOSIS:   RECURRENT COLON BLEEDING AT SPLENIC FLEXURE INCARCERATED INCISIONAL HERNIA (4X3CM)   PROCEDURE:   -EXTENDED LEFT HEMICOLECTOMY -MOBILIZATION OF SPLENIC FLEXURE OF COLON -EN BLOC RESECTION OF COLORECTAL ANASTOMOSIS -OMENTECTOMY -REDUCTION & PRIMARY REPAIR OF INCISIONAL ANASTOMOSIS -INTRAOPERATIVE ASSESSMENT OF TISSUE VASCULAR PERFUSION USING ICG (indocyanine green) IMMUNOFLUORESCENCE -TRANSVERSUS ABDOMINIS PLANE (TAP) BLOCK - BILATERAL -RIGID PROCTOSCOPY   SURGEON:  Adin Hector, MD  OR FINDINGS:    Patient had moderate adhesions.  Large swath of greater omentum incarcerated in left lower quadrant paramedian incision from prior left colectomy.  Omentum reduced removed and primary repair done.   Thick inflamed irritated colon with large swath of dense adhesions at the splenic flexure and omentum.  Prior colorectal anastomosis noted at the rectosigmoid junction.  Resection done to the proximal mid transverse colon using a dominant right middle colic artery pedicle.   No obvious metastatic disease on visceral parietal peritoneum or liver.   It is a 72m EEA anastomosis (proximal mid transverse colon connected to proximal rectum.)  It rests 15 cm from the anal verge by rigid proctoscopy.  Consults: Case Management / Social Work, Wound ostomy consult nurse (WOCN), Anesthesia, and Urology  Hospital Course:   Patient with recurrent episodes of lower  GI bleeding with bleeding scan isolated to the splenic flexure x 2.  Recurrent episode last month.  Rest of the workup underwhelming.  The patient underwent the surgery above.  Postoperatively, the patient gradually mobilized and advanced to a solid diet.  Pain and other symptoms were treated aggressively.    By the time of discharge, the patient was walking well the hallways, eating food, having flatus.  Pain was well-controlled on an oral medications.  Oxycodone and methocarbamol worked better than tramadol alone.  No tachycardia.  Tolerating solid diet.  No nausea or vomiting.  Walking.  Based on meeting discharge criteria and continuing to recover, I felt it was safe for the patient to be discharged from the hospital to further recover with close followup. Postoperative recommendations were discussed in detail.  They are written as well.  Discharged Condition: good  Discharge Exam: Blood pressure 135/88, pulse 99, temperature 98.5 F (36.9 C), temperature source Oral, resp. rate 18, height 5' 7.5" (1.715 m), weight 120.6 kg, SpO2 92 %.  General: Pt awake/alert/oriented x4 in No acute distress.  Calm and relaxed.  Conversational. Eyes: PERRL, normal EOM.  Sclera clear.  No icterus Neuro: CN II-XII intact w/o focal sensory/motor deficits. Lymph: No head/neck/groin lymphadenopathy Psych:  No delerium/psychosis/paranoia HENT: Normocephalic, Mucus membranes moist.  No thrush Neck: Supple, No tracheal deviation Chest:  No chest wall pain w good excursion CV:  Pulses intact.  Regular rhythm MS: Normal AROM mjr joints.  No obvious deformity Abdomen: Open obese but Soft.  Nondistended.  Mildly tender at incisions only.  Pfannenstiel incision well-healed without any cellulitis or drainage.  No evidence of peritonitis.  No incarcerated hernias. Ext:  SCDs BLE.  No mjr edema.  No cyanosis Skin: No petechiae /  purpura   Disposition:    Follow-up Information     Michael Boston, MD Follow up on  08/07/2022.   Specialties: General Surgery, Colon and Rectal Surgery Contact information: [address] [place] [address] [phone]                 Discharge disposition: 01-Home or Self Care       Discharge Instructions     Call MD for:   Complete by: As directed    FEVER > 101.5 F  (temperatures < 101.5 F are not significant)   Call MD for:  extreme fatigue   Complete by: As directed    Call MD for:  persistant dizziness or light-headedness   Complete by: As directed    Call MD for:  persistant nausea and vomiting   Complete by: As directed    Call MD for:  redness, tenderness, or signs of infection (pain, swelling, redness, odor or green/yellow discharge around incision site)   Complete by: As directed    Call MD for:  severe uncontrolled pain   Complete by: As directed    Diet - low sodium heart healthy   Complete by: As directed    Start with a bland diet such as soups, liquids, starchy foods, low fat foods, etc. the first few days at home. Gradually advance to a solid, low-fat, high fiber diet by the end of the first week at home.   Add a fiber supplement to your diet (Metamucil, etc) If you feel full, bloated, or constipated, stay on a full liquid or pureed/blenderized diet for a few days until you feel better and are no longer constipated.   Discharge instructions   Complete by: As directed    See Discharge Instructions If you are not getting better after two weeks or are noticing you are getting worse, contact our office [phone] for further advice.  We may need to adjust your medications, re-evaluate you in the office, send you to the emergency room, or see what other things we can do to help. The clinic staff is available to answer your questions during regular business hours (8:30am-5pm).  Please don't hesitate to call and ask to speak to one of our nurses for clinical concerns.    A surgeon from [facility] is always  on call at the hospitals 24 hours/day If you have a medical emergency, go to the nearest emergency room or call 911.   Discharge wound care:   Complete by: As directed    It is good for closed incisions and even open wounds to be washed every day.  Shower every day.  Short baths are fine.  Wash the incisions and wounds clean with soap & water.    You may leave closed incisions open to air if it is dry.   You may cover the incision with clean gauze & replace it after your daily shower for comfort.  TEGADERM:  You have clear gauze band-aid dressings over your closed incision(s).  Remove the dressings 3 days after surgery.= Sunday 2/18   Driving Restrictions   Complete by: As directed    You may drive when: - you are no longer taking narcotic prescription pain medication - you can comfortably wear a seatbelt - you can safely make sudden turns/stops without pain.   Increase activity slowly   Complete by: As directed    Start light daily activities --- self-care, walking, climbing stairs- beginning the day after  surgery.  Gradually increase activities as tolerated.  Control your pain to be active.  Stop when you are tired.  Ideally, walk several times a day, eventually an hour a day.   Most people are back to most day-to-day activities in a few weeks.  It takes 4-6 weeks to get back to unrestricted, intense activity. If you can walk 30 minutes without difficulty, it is safe to try more intense activity such as jogging, treadmill, bicycling, low-impact aerobics, swimming, etc. Save the most intensive and strenuous activity for last (Usually 4-8 weeks after surgery) such as sit-ups, heavy lifting, contact sports, etc.  Refrain from any intense heavy lifting or straining until you are off narcotics for pain control.  You will have off days, but things should improve week-by-week. DO NOT PUSH THROUGH PAIN.  Let pain be your guide: If it hurts to do something, don't do it.   Lifting restrictions    Complete by: As directed    If you can walk 30 minutes without difficulty, it is safe to try more intense activity such as jogging, treadmill, bicycling, low-impact aerobics, swimming, etc. Save the most intensive and strenuous activity for last (Usually 4-8 weeks after surgery) such as sit-ups, heavy lifting, contact sports, etc.   Refrain from any intense heavy lifting or straining until you are off narcotics for pain control.  You will have off days, but things should improve week-by-week. DO NOT PUSH THROUGH PAIN.  Let pain be your guide: If it hurts to do something, don't do it.  Pain is your body warning you to avoid that activity for another week until the pain goes down.   May shower / Bathe   Complete by: As directed    May walk up steps   Complete by: As directed    Sexual Activity Restrictions   Complete by: As directed    You may have sexual intercourse when it is comfortable. If it hurts to do something, stop.       Allergies as of 07/22/2022   No Known Allergies      Medication List     STOP taking these medications    [employer] Purelax 17 GM/SCOOP powder Generic drug: polyethylene glycol powder   senna-docusate 8.6-50 MG tablet Commonly known as: Senokot-S       TAKE these medications    amLODipine 5 MG tablet Commonly known as: NORVASC Take 1 tablet (5 mg total) by mouth daily.   buPROPion 150 MG 24 hr tablet Commonly known as: WELLBUTRIN XL Take 1 tablet (150 mg total) by mouth daily.   cyanocobalamin 500 MCG tablet Commonly known as: VITAMIN B12 Take 1 tablet (500 mcg total) by mouth daily.   ferrous sulfate 325 (65 FE) MG tablet Take 1 tablet (325 mg total) by mouth daily.   methocarbamol 500 MG tablet Commonly known as: ROBAXIN Take 1 tablet (500 mg) by mouth every 8 hours as needed for muscle spasms.   metoprolol succinate 25 MG 24 hr tablet Commonly known as: TOPROL-XL Take 1 tablet (25 mg total) by mouth daily.   oxyCODONE 5 MG immediate  release tablet Commonly known as: Oxy IR/ROXICODONE Take 1-2 tablets (5-10 mg total) by mouth every 6 (six) hours as needed for breakthrough pain or severe pain.   valACYclovir 500 MG tablet Commonly known as: VALTREX Take 1 tablet (500 mg total) by mouth daily.               Discharge Care Instructions  (From admission,  onward)           Start     Ordered   07/18/22 0000  Discharge wound care:       Comments: It is good for closed incisions and even open wounds to be washed every day.  Shower every day.  Short baths are fine.  Wash the incisions and wounds clean with soap & water.    You may leave closed incisions open to air if it is dry.   You may cover the incision with clean gauze & replace it after your daily shower for comfort.  TEGADERM:  You have clear gauze band-aid dressings over your closed incision(s).  Remove the dressings 3 days after surgery.= Sunday 2/18   07/18/22 0723            Significant Diagnostic Studies:  Results for orders placed or performed during the hospital encounter of 07/18/22 (from the past 72 hour(s))  CBC     Status: Abnormal   Collection Time: 07/20/22  3:24 AM  Result Value Ref Range   WBC 15.5 (H) 4.0 - 10.5 K/uL   RBC 3.74 (L) 4.22 - 5.81 MIL/uL   Hemoglobin 10.4 (L) 13.0 - 17.0 g/dL   HCT 32.7 (L) 39.0 - 52.0 %   MCV 87.4 80.0 - 100.0 fL   MCH 27.8 26.0 - 34.0 pg   MCHC 31.8 30.0 - 36.0 g/dL   RDW 16.1 (H) 11.5 - 15.5 %   Platelets 316 150 - 400 K/uL   nRBC 0.0 0.0 - 0.2 %    Comment: Performed at [facility], [place] [address], [place], [place] [id number]  Basic metabolic panel     Status: Abnormal   Collection Time: 07/20/22  3:24 AM  Result Value Ref Range   Sodium 132 (L) 135 - 145 mmol/L   Potassium 3.5 3.5 - 5.1 mmol/L   Chloride 98 98 - 111 mmol/L   CO2 26 22 - 32 mmol/L   Glucose, Bld 100 (H) 70 - 99 mg/dL    Comment: Glucose reference range applies only to samples taken after fasting for at  least 8 hours.   BUN 13 6 - 20 mg/dL   Creatinine, Ser 0.91 0.61 - 1.24 mg/dL   Calcium 8.2 (L) 8.9 - 10.3 mg/dL   GFR, Estimated >60 >60 mL/min    Comment: (NOTE) Calculated using the CKD-EPI Creatinine Equation (2021)    Anion gap 8 5 - 15    Comment: Performed at [facility], [place] [address], [place], [place] [address]  CBC     Status: Abnormal   Collection Time: 07/21/22  3:06 AM  Result Value Ref Range   WBC 20.5 (H) 4.0 - 10.5 K/uL   RBC 3.80 (L) 4.22 - 5.81 MIL/uL   Hemoglobin 10.2 (L) 13.0 - 17.0 g/dL   HCT 33.2 (L) 39.0 - 52.0 %   MCV 87.4 80.0 - 100.0 fL   MCH 26.8 26.0 - 34.0 pg   MCHC 30.7 30.0 - 36.0 g/dL   RDW 16.3 (H) 11.5 - 15.5 %   Platelets 329 150 - 400 K/uL   nRBC 0.0 0.0 - 0.2 %    Comment: Performed at [facility], [place] [address] [place], [place] [id number]  Basic metabolic panel     Status: Abnormal   Collection Time: 07/21/22  3:06 AM  Result Value Ref Range   Sodium 134 (L) 135 - 145 mmol/L   Potassium 3.3 (L)  3.5 - 5.1 mmol/L   Chloride 100 98 - 111 mmol/L   CO2 26 22 - 32 mmol/L   Glucose, Bld 106 (H) 70 - 99 mg/dL    Comment: Glucose reference range applies only to samples taken after fasting for at least 8 hours.   BUN 10 6 - 20 mg/dL   Creatinine, Ser 0.76 0.61 - 1.24 mg/dL   Calcium 8.4 (L) 8.9 - 10.3 mg/dL   GFR, Estimated >60 >60 mL/min    Comment: (NOTE) Calculated using the CKD-EPI Creatinine Equation (2021)    Anion gap 8 5 - 15    Comment: Performed at [facility], [place] [address] [place], [place] [address]    DG C-Arm 1-60 Min-No Report  Result Date: 07/18/2022 Fluoroscopy was utilized by the requesting physician.  No radiographic interpretation.    Past Medical History:  Diagnosis Date   Anxiety    COVID-19 05/10/2021   Depression    Diabetes mellitus without complication ([place])    Diverticulitis of large intestine with perforation with bleeding 2015, 2017    Laparoscopic sigmoid colectomy 2017   Genital herpes    Hypertension    Irritability and anger    due to stress    Past Surgical History:  Procedure Laterality Date   COLONOSCOPY W/ POLYPECTOMY  09/2014   diverticulosis ([place])   COLONOSCOPY WITH PROPOFOL N/A 06/11/2022   2 small polyps not removed, diverticulosis, healthy anastomosis, non-bleeding int hem [name], [name])   CYSTOSCOPY W/ RETROGRADES Bilateral 08/16/2015   [name], MD   CYSTOSCOPY WITH STENT PLACEMENT Bilateral 08/16/2015   [name], MD   LAPAROSCOPIC SIGMOID COLECTOMY N/A 08/16/2015   recurrent diverticulitis; [name], MD   PROCTOSCOPY N/A 07/18/2022   Procedure: RIGID PROCTOSCOPY;  Surgeon: Michael Boston, MD;  Location: [facility];  Service: General;  Laterality: N/A;   REFRACTIVE SURGERY  1999   LASIK    Social History   Socioeconomic History   Marital status: Married    Spouse name: Not on file   Number of children: Not on file   Years of education: Not on file   Highest education level: Not on file  Occupational History   Not on file  Tobacco Use   Smoking status: Never   Smokeless tobacco: Former    Types: Chew, Snuff  Vaping Use   Vaping Use: Former  Substance and Sexual Activity   Alcohol use: Yes    Alcohol/week: 1.0 standard drink of alcohol    Types: 1 Cans of beer per week    Comment: socially   Drug use: No    Comment: some in HS   Sexual activity: Yes    Birth control/protection: None  Other Topics Concern   Not on file  Social History Narrative   Lives with wife, 2 children and mother   Occ: Leisure centre manager guard   Edu: tech school   Activity: active at work   Diet: on keto diet   Social Determinants of Health   Financial Resource Strain: Not on file  Food Insecurity: No Food Insecurity (07/18/2022)   Hunger Vital Sign    Worried About Running Out of Food in the Last Year: Never true    Ran Out of Food in the Last Year: Never true  Transportation Needs:  No Transportation Needs (07/18/2022)   PRAPARE - Hydrologist (Medical): No    Lack of Transportation (Non-Medical): No  Physical Activity: Not on  file  Stress: Not on file  Social Connections: Not on file  Intimate Partner Violence: Not At Risk (07/18/2022)   Humiliation, Afraid, Rape, and Kick questionnaire    Fear of Current or Ex-Partner: No    Emotionally Abused: No    Physically Abused: No    Sexually Abused: No    Family History  Adopted: Yes  Family history unknown: Yes    Current Facility-Administered Medications  Medication Dose Route Frequency Provider Last Rate Last Admin   0.9 %  sodium chloride infusion  250 mL Intravenous PRN Michael Boston, MD       acetaminophen (TYLENOL) tablet 1,000 mg  1,000 mg Oral Lajuana Ripple, MD   1,000 mg at 07/22/22 0625   alum & mag hydroxide-simeth (MAALOX/MYLANTA) 200-200-20 MG/5ML suspension 30 mL  30 mL Oral Q6H PRN Michael Boston, MD       buPROPion (WELLBUTRIN XL) 24 hr tablet 150 mg  150 mg Oral Daily Michael Boston, MD   150 mg at 07/21/22 [id number]   cyanocobalamin (VITAMIN B12) tablet 500 mcg  500 mcg Oral Daily Michael Boston, MD   500 mcg at 07/21/22 [id number]   diphenhydrAMINE (BENADRYL) 12.5 MG/5ML elixir 12.5 mg  12.5 mg Oral Q6H PRN Michael Boston, MD       Or   diphenhydrAMINE (BENADRYL) injection 12.5 mg  12.5 mg Intravenous Q6H PRN Michael Boston, MD       enalaprilat (VASOTEC) injection 0.625-1.25 mg  0.625-1.25 mg Intravenous Q6H PRN Michael Boston, MD       enoxaparin (LOVENOX) injection 40 mg  40 mg Subcutaneous Q24H Michael Boston, MD   40 mg at 07/21/22 0940   feeding supplement (ENSURE SURGERY) liquid 237 mL  237 mL Oral BID BM Michael Boston, MD   237 mL at 07/20/22 0941   hydrALAZINE (APRESOLINE) injection 10 mg  10 mg Intravenous Q2H PRN Michael Boston, MD       HYDROmorphone (DILAUDID) injection 0.5 mg  0.5 mg Intravenous Q4H PRN Rolm Bookbinder, MD   0.5 mg at 07/20/22 2206   lip balm (CARMEX)  ointment   Topical BID Michael Boston, MD   Given at 07/21/22 2216   magic mouthwash  15 mL Oral QID PRN Michael Boston, MD       melatonin tablet 3 mg  3 mg Oral QHS PRN Michael Boston, MD       menthol-cetylpyridinium (CEPACOL) lozenge 3 mg  1 lozenge Oral PRN Michael Boston, MD       methocarbamol (ROBAXIN) tablet 1,000 mg  1,000 mg Oral QID Rolm Bookbinder, MD   1,000 mg at 07/21/22 2154   metoprolol succinate (TOPROL-XL) 24 hr tablet 25 mg  25 mg Oral Daily Michael Boston, MD   25 mg at 07/21/22 0937   ondansetron (ZOFRAN) tablet 4 mg  4 mg Oral Q6H PRN Michael Boston, MD       Or   ondansetron [facility]) injection 4 mg  4 mg Intravenous Q6H PRN Michael Boston, MD   4 mg at 07/18/22 1651   oxyCODONE (Oxy IR/ROXICODONE) immediate release tablet 5-10 mg  5-10 mg Oral Q4H PRN Rolm Bookbinder, MD   5 mg at 07/22/22 [id number]   phenol (CHLORASEPTIC) mouth spray 2 spray  2 spray Mouth/Throat PRN Michael Boston, MD       polycarbophil (FIBERCON) tablet 625 mg  625 mg Oral BID Michael Boston, MD       prochlorperazine (COMPAZINE) tablet 10 mg  10 mg Oral Q6H PRN  Michael Boston, MD       Or   prochlorperazine (COMPAZINE) injection 5-10 mg  5-10 mg Intravenous Q6H PRN Michael Boston, MD       simethicone [facility]) chewable tablet 40 mg  40 mg Oral Q6H PRN Michael Boston, MD   40 mg at 07/21/22 1249   sodium chloride flush (NS) 0.9 % injection 3 mL  3 mL Intravenous Gorden Harms, MD   3 mL at 07/21/22 2217   sodium chloride flush (NS) 0.9 % injection 3 mL  3 mL Intravenous PRN Michael Boston, MD       valACYclovir Estell Harpin) tablet 500 mg  500 mg Oral Daily Michael Boston, MD   500 mg at 07/21/22 [id number]     No Known Allergies  Signed:   Adin Hector, MD, FACS, MASCRS Esophageal, Gastrointestinal & Colorectal Surgery Robotic and Minimally Invasive Surgery  [facility] [place] [id number], [place], [place] [address] [phone] Fax [phone] Main  CONTACT INFORMATION:  Weekday (9AM-5PM): Call [facility] main office at [phone]  Weeknight (5PM-9AM) or Weekend/Holiday: Check [email or web address] (password " [email or web address]") for General Surgery [facility] coverage  (Please, do not use SecureChat as it is not reliable communication to reach operating surgeons for immediate patient care given surgeries/outpatient duties/clinic/cross-coverage/off post-call which would lead to a delay in care.  Epic staff messaging available for outptient concerns, but may not be answered for 48 hours or more).     07/22/2022, 7:55 AM

## 2022-07-22 NOTE — Plan of Care (Signed)
  Problem: Education: Goal: Understanding of discharge needs will improve 07/22/2022 1025 by Lise Auer, LPN Outcome: Adequate for Discharge 07/22/2022 0826 by Lise Auer, LPN Outcome: Progressing Goal: Verbalization of understanding of the causes of altered bowel function will improve Outcome: Adequate for Discharge   Problem: Bowel/Gastric: Goal: Gastrointestinal status for postoperative course will improve Outcome: Adequate for Discharge   Problem: Health Behavior/Discharge Planning: Goal: Identification of community resources to assist with postoperative recovery needs will improve Outcome: Adequate for Discharge   Problem: Nutritional: Goal: Will attain and maintain optimal nutritional status will improve 07/22/2022 1025 by Lise Auer, LPN Outcome: Adequate for Discharge 07/22/2022 0826 by Lise Auer, LPN Outcome: Progressing   Problem: Clinical Measurements: Goal: Postoperative complications will be avoided or minimized Outcome: Adequate for Discharge   Problem: Respiratory: Goal: Respiratory status will improve Outcome: Adequate for Discharge   Problem: Skin Integrity: Goal: Will show signs of wound healing 07/22/2022 1025 by Lise Auer, LPN Outcome: Adequate for Discharge 07/22/2022 0826 by Lise Auer, LPN Outcome: Progressing   Problem: Health Behavior/Discharge Planning: Goal: Ability to manage health-related needs will improve Outcome: Adequate for Discharge   Problem: Clinical Measurements: Goal: Ability to maintain clinical measurements within normal limits will improve Outcome: Adequate for Discharge Goal: Will remain free from infection Outcome: Adequate for Discharge Goal: Diagnostic test results will improve Outcome: Adequate for Discharge Goal: Respiratory complications will improve Outcome: Adequate for Discharge   Problem: Nutrition: Goal: Adequate nutrition will be maintained Outcome: Adequate for Discharge    Problem: Elimination: Goal: Will not experience complications related to bowel motility Outcome: Adequate for Discharge Goal: Will not experience complications related to urinary retention Outcome: Adequate for Discharge   Problem: Pain Managment: Goal: General experience of comfort will improve Outcome: Adequate for Discharge   Problem: Safety: Goal: Ability to remain free from injury will improve Outcome: Adequate for Discharge   Problem: Skin Integrity: Goal: Risk for impaired skin integrity will decrease Outcome: Adequate for Discharge

## 2022-07-22 NOTE — Plan of Care (Signed)
  Problem: Education: Goal: Understanding of discharge needs will improve Outcome: Progressing   Problem: Nutritional: Goal: Will attain and maintain optimal nutritional status will improve Outcome: Progressing   Problem: Skin Integrity: Goal: Will show signs of wound healing Outcome: Progressing

## 2022-07-24 ENCOUNTER — Encounter: Payer: Self-pay | Admitting: Family Medicine

## 2022-07-25 ENCOUNTER — Inpatient Hospital Stay (HOSPITAL_COMMUNITY)
Admission: EM | Admit: 2022-07-25 | Discharge: 2022-07-29 | DRG: 393 | Disposition: A | Discharge status: Home / Self Care | Payer: 59 | Attending: Surgery | Admitting: Surgery

## 2022-07-25 ENCOUNTER — Other Ambulatory Visit: Payer: Self-pay

## 2022-07-25 ENCOUNTER — Emergency Department (HOSPITAL_COMMUNITY): Payer: 59

## 2022-07-25 ENCOUNTER — Encounter (HOSPITAL_COMMUNITY): Payer: Self-pay

## 2022-07-25 DIAGNOSIS — R58 Hemorrhage, not elsewhere classified: Secondary | ICD-10-CM | POA: Diagnosis present

## 2022-07-25 DIAGNOSIS — F419 Anxiety disorder, unspecified: Secondary | ICD-10-CM | POA: Diagnosis present

## 2022-07-25 DIAGNOSIS — Z7984 Long term (current) use of oral hypoglycemic drugs: Secondary | ICD-10-CM | POA: Diagnosis not present

## 2022-07-25 DIAGNOSIS — K9189 Other postprocedural complications and disorders of digestive system: Secondary | ICD-10-CM | POA: Diagnosis present

## 2022-07-25 DIAGNOSIS — E785 Hyperlipidemia, unspecified: Secondary | ICD-10-CM | POA: Diagnosis present

## 2022-07-25 DIAGNOSIS — Y838 Other surgical procedures as the cause of abnormal reaction of the patient, or of later complication, without mention of misadventure at the time of the procedure: Secondary | ICD-10-CM | POA: Diagnosis present

## 2022-07-25 DIAGNOSIS — Z9049 Acquired absence of other specified parts of digestive tract: Secondary | ICD-10-CM | POA: Diagnosis not present

## 2022-07-25 DIAGNOSIS — Z8616 Personal history of COVID-19: Secondary | ICD-10-CM | POA: Diagnosis not present

## 2022-07-25 DIAGNOSIS — K5721 Diverticulitis of large intestine with perforation and abscess with bleeding: Secondary | ICD-10-CM | POA: Diagnosis present

## 2022-07-25 DIAGNOSIS — K5792 Diverticulitis of intestine, part unspecified, without perforation or abscess without bleeding: Secondary | ICD-10-CM | POA: Diagnosis present

## 2022-07-25 DIAGNOSIS — E1169 Type 2 diabetes mellitus with other specified complication: Secondary | ICD-10-CM | POA: Diagnosis present

## 2022-07-25 DIAGNOSIS — I10 Essential (primary) hypertension: Secondary | ICD-10-CM | POA: Diagnosis present

## 2022-07-25 DIAGNOSIS — K5733 Diverticulitis of large intestine without perforation or abscess with bleeding: Secondary | ICD-10-CM | POA: Diagnosis present

## 2022-07-25 DIAGNOSIS — K668 Other specified disorders of peritoneum: Secondary | ICD-10-CM

## 2022-07-25 LAB — BASIC METABOLIC PANEL
Anion gap: 12 (ref 5–15)
BUN: 12 mg/dL (ref 6–20)
CO2: 28 mmol/L (ref 22–32)
Calcium: 9 mg/dL (ref 8.9–10.3)
Chloride: 98 mmol/L (ref 98–111)
Creatinine, Ser: 0.78 mg/dL (ref 0.61–1.24)
GFR, Estimated: >60 mL/min (ref >60)
Glucose, Bld: 112 mg/dL — ABNORMAL HIGH (ref 70–99)
Potassium: 3 mmol/L — ABNORMAL LOW (ref 3.5–5.1)
Sodium: 138 mmol/L (ref 135–145)

## 2022-07-25 LAB — PROTIME-INR
INR: 1.1 (ref 0.8–1.2)
Prothrombin Time: 13.9 seconds (ref 11.4–15.2)

## 2022-07-25 LAB — CBC WITH DIFFERENTIAL/PLATELET
Abs Immature Granulocytes: 0.49 10*3/uL — ABNORMAL HIGH (ref 0.00–0.07)
Basophils Absolute: 0.1 10*3/uL (ref 0.0–0.1)
Basophils Relative: 0 %
Eosinophils Absolute: 0.5 10*3/uL (ref 0.0–0.5)
Eosinophils Relative: 4 %
HCT: 32.6 % — ABNORMAL LOW (ref 39.0–52.0)
Hemoglobin: 10.4 g/dL — ABNORMAL LOW (ref 13.0–17.0)
Immature Granulocytes: 4 %
Lymphocytes Relative: 9 %
Lymphs Abs: 1.2 10*3/uL (ref 0.7–4.0)
MCH: 26.8 pg (ref 26.0–34.0)
MCHC: 31.9 g/dL (ref 30.0–36.0)
MCV: 84 fL (ref 80.0–100.0)
Monocytes Absolute: 2 10*3/uL — ABNORMAL HIGH (ref 0.1–1.0)
Monocytes Relative: 16 %
Neutro Abs: 8.6 10*3/uL — ABNORMAL HIGH (ref 1.7–7.7)
Neutrophils Relative %: 67 %
Platelets: 529 10*3/uL — ABNORMAL HIGH (ref 150–400)
RBC: 3.88 MIL/uL — ABNORMAL LOW (ref 4.22–5.81)
RDW: 16.6 % — ABNORMAL HIGH (ref 11.5–15.5)
WBC: 12.8 10*3/uL — ABNORMAL HIGH (ref 4.0–10.5)
nRBC: 0.2 % (ref 0.0–0.2)

## 2022-07-25 LAB — URINALYSIS, ROUTINE W REFLEX MICROSCOPIC
Bilirubin Urine: NEGATIVE
Glucose, UA: NEGATIVE mg/dL
Hgb urine dipstick: NEGATIVE
Ketones, ur: 20 mg/dL — AB
Leukocytes,Ua: NEGATIVE
Nitrite: NEGATIVE
Protein, ur: NEGATIVE mg/dL
Specific Gravity, Urine: 1.005 (ref 1.005–1.030)
pH: 5 (ref 5.0–8.0)

## 2022-07-25 LAB — MAGNESIUM: Magnesium: 1.9 mg/dL (ref 1.7–2.4)

## 2022-07-25 MED ORDER — IOHEXOL 300 MG/ML  SOLN: 30.0000 mL | Freq: Once | INTRAMUSCULAR | Status: DC | PRN

## 2022-07-25 MED ORDER — DIPHENHYDRAMINE HCL 12.5 MG/5ML PO ELIX: 12.5000 mg | ORAL_SOLUTION | Freq: Four times a day (QID) | ORAL | Status: DC | PRN

## 2022-07-25 MED ORDER — METOPROLOL TARTRATE 5 MG/5ML IV SOLN: 5.0000 mg | Freq: Four times a day (QID) | INTRAVENOUS | Status: DC | PRN

## 2022-07-25 MED ORDER — IOHEXOL 300 MG/ML  SOLN
100.0000 mL | Freq: Once | INTRAMUSCULAR | Status: AC | PRN
  Administered 2022-07-25: 100 mL via INTRAVENOUS

## 2022-07-25 MED ORDER — ENOXAPARIN SODIUM 40 MG/0.4ML IJ SOSY
40.0000 mg | PREFILLED_SYRINGE | INTRAMUSCULAR | Status: DC
  Administered 2022-07-25 – 2022-07-28 (×4): 40 mg via SUBCUTANEOUS
  Filled 2022-07-25 (×4): qty 0.4

## 2022-07-25 MED ORDER — POTASSIUM CHLORIDE 10 MEQ/100ML IV SOLN
10.0000 meq | INTRAVENOUS | Status: AC
  Administered 2022-07-25 (×4): 10 meq via INTRAVENOUS
  Filled 2022-07-25: qty 100

## 2022-07-25 MED ORDER — SODIUM CHLORIDE (PF) 0.9 % IJ SOLN
INTRAMUSCULAR | Status: AC
  Filled 2022-07-25: qty 50

## 2022-07-25 MED ORDER — SIMETHICONE 80 MG PO CHEW: 40.0000 mg | CHEWABLE_TABLET | Freq: Four times a day (QID) | ORAL | Status: DC | PRN

## 2022-07-25 MED ORDER — ONDANSETRON 4 MG PO TBDP: 4.0000 mg | ORAL_TABLET | Freq: Four times a day (QID) | ORAL | Status: DC | PRN

## 2022-07-25 MED ORDER — MENTHOL 3 MG MT LOZG: 1.0000 | LOZENGE | OROMUCOSAL | Status: DC | PRN

## 2022-07-25 MED ORDER — ALUM & MAG HYDROXIDE-SIMETH 200-200-20 MG/5ML PO SUSP
30.0000 mL | Freq: Four times a day (QID) | ORAL | Status: DC | PRN
  Administered 2022-07-25: 30 mL via ORAL
  Filled 2022-07-25: qty 30

## 2022-07-25 MED ORDER — MAGIC MOUTHWASH: 15.0000 mL | Freq: Four times a day (QID) | ORAL | Status: DC | PRN

## 2022-07-25 MED ORDER — PROCHLORPERAZINE EDISYLATE 10 MG/2ML IJ SOLN: 5.0000 mg | Freq: Four times a day (QID) | INTRAMUSCULAR | Status: DC | PRN

## 2022-07-25 MED ORDER — METHOCARBAMOL 500 MG PO TABS
1000.0000 mg | ORAL_TABLET | Freq: Four times a day (QID) | ORAL | Status: DC | PRN
  Administered 2022-07-25 – 2022-07-29 (×9): 1000 mg via ORAL
  Filled 2022-07-25 (×9): qty 2

## 2022-07-25 MED ORDER — ACETAMINOPHEN 325 MG PO TABS
325.0000 mg | ORAL_TABLET | Freq: Four times a day (QID) | ORAL | Status: DC | PRN
  Administered 2022-07-25 – 2022-07-29 (×4): 650 mg via ORAL
  Filled 2022-07-25 (×5): qty 2

## 2022-07-25 MED ORDER — POTASSIUM CHLORIDE CRYS ER 20 MEQ PO TBCR
40.0000 meq | EXTENDED_RELEASE_TABLET | Freq: Every day | ORAL | Status: AC
  Administered 2022-07-25 – 2022-07-27 (×3): 40 meq via ORAL
  Filled 2022-07-25 (×3): qty 2

## 2022-07-25 MED ORDER — LIP MEDEX EX OINT
TOPICAL_OINTMENT | Freq: Two times a day (BID) | CUTANEOUS | Status: DC
  Administered 2022-07-25: 75 via TOPICAL
  Administered 2022-07-26 (×2): 1 via TOPICAL
  Administered 2022-07-28: 75 via TOPICAL
  Filled 2022-07-25 (×3): qty 7

## 2022-07-25 MED ORDER — METHOCARBAMOL 1000 MG/10ML IJ SOLN: 1000.0000 mg | Freq: Four times a day (QID) | INTRAVENOUS | Status: DC | PRN

## 2022-07-25 MED ORDER — ONDANSETRON HCL 4 MG/2ML IJ SOLN: 4.0000 mg | Freq: Four times a day (QID) | INTRAMUSCULAR | Status: DC | PRN

## 2022-07-25 MED ORDER — LACTATED RINGERS IV BOLUS: 1000.0000 mL | Freq: Three times a day (TID) | INTRAVENOUS | Status: AC | PRN

## 2022-07-25 MED ORDER — PIPERACILLIN-TAZOBACTAM 3.375 G IVPB
3.3750 g | Freq: Three times a day (TID) | INTRAVENOUS | Status: DC
  Administered 2022-07-25 – 2022-07-28 (×10): 3.375 g via INTRAVENOUS
  Filled 2022-07-25 (×10): qty 50

## 2022-07-25 MED ORDER — PROCHLORPERAZINE MALEATE 10 MG PO TABS: 10.0000 mg | ORAL_TABLET | Freq: Four times a day (QID) | ORAL | Status: DC | PRN

## 2022-07-25 MED ORDER — LACTATED RINGERS IV SOLN: INTRAVENOUS | Status: DC

## 2022-07-25 MED ORDER — LACTATED RINGERS IV BOLUS
1000.0000 mL | Freq: Once | INTRAVENOUS | Status: AC
  Administered 2022-07-25: 1000 mL via INTRAVENOUS

## 2022-07-25 MED ORDER — HYDROMORPHONE HCL 1 MG/ML IJ SOLN
0.5000 mg | INTRAMUSCULAR | Status: DC | PRN
  Administered 2022-07-25: 1 mg via INTRAVENOUS
  Filled 2022-07-25 (×3): qty 1

## 2022-07-25 MED ORDER — DIPHENHYDRAMINE HCL 50 MG/ML IJ SOLN: 12.5000 mg | Freq: Four times a day (QID) | INTRAMUSCULAR | Status: DC | PRN

## 2022-07-25 MED ORDER — PHENOL 1.4 % MT LIQD: 2.0000 | OROMUCOSAL | Status: DC | PRN

## 2022-07-25 NOTE — ED Triage Notes (Signed)
Pt c/o right sided lower bruise following recent discharge from hospital for bowel resection 2/19. Pt unable to recall injury. No falls. No pain, outside surgical pain to left lower abdomen. No additional concerns. Abdomen distended on assessment.

## 2022-07-25 NOTE — Progress Notes (Signed)
CT done.  Contrast seems rather weak but confirmed administration by radiology.  Concern by radiology there may be a small contained leak.  Does not seem very apparent to me but we will hedge and admit for observation for now.  Treat this like a diverticulitis with microperf.  IV antibiotics.  Bowel rest.  n.p.o. today.  If feeling well, consider oral trial tomorrow.  If rapidly worsening, may require operative exploration with washout drain and diverting loop ileostomy.  Seems unlikely.  If not improved by 4-5 days, CT scan to rule out abscess formation.

## 2022-07-25 NOTE — ED Provider Notes (Signed)
7:22 AM Assumed care of patient from off-going team. For more details, please see note from same day.  In brief, this is a 47 y.o. male with recent h/o partial colectomy, splenic flexure removed, w/ hernia repair on 2/15. DC'd on Tuesday, had bruising on R flank prior to DC, worsening. CT shows intraabdominal  and mesenteric air.   Plan/Dispo at time of sign-out & ED Course since sign-out: [ ]$  surgery consult  BP (!) 158/101   Pulse (!) 104   Temp 99.2 F (37.3 C) (Axillary)   Resp (!) 22   SpO2 93%    ED Course:   Clinical Course as of 07/25/22 1125  Thu Jul 25, 2022  1044 Per Dr. Clyda Greener consult note:  Clinically he does not look like he is leaking at all but given the concern raised by the CAT scan, will do contrasted enema CT.  Usually can do pelvis but will include abdomen since it may be higher up.     If there is no leak along there and he tolerates p.o., most likely can discharge with reassurance.   If there is concern for a leak treat this like a diverticulitis with perforation with bowel rest and IV antibiotics and see if this will seal since it does not seem massive.  If that fails then may require laparoscopic washout and diverting loop ileostomy.  Hopefully not as likely but we will see.     Ecchymosis on right flank not surprising.  Hemoglobin is stable and certainly not worse.  He is not in hemorrhagic shock.  I think that will resolve over time.   No evidence of any wound infection or abscess or digestive tract issue at this time.  [HN]  56 CT ABDOMEN PELVIS W CONTRAST Post LEFT hemicolectomy with sigmoid anastomosis.  Small extraluminal gas collection seen adjacent to the anastomosis on the previous exam is smaller in size and now contains a small amount of contrast consistent with anastomotic leak.  Persistent significant edema and extraluminal gas adjacent to the sigmoid colon as well as persistent free air.   [HN]  21 Messaged Dr. Johney Maine about CT results  [HN]  [id number] Zosyn in progress [HN]  1125 Admitted to surgery [HN]    Clinical Course User Index [HN] Audley Hose, MD    Dispo: Admit ------------------------------- Cindee Lame, MD Emergency Medicine  This note was created using dictation software, which may contain spelling or grammatical errors.   Audley Hose, MD 07/25/22 1125

## 2022-07-25 NOTE — Consult Note (Addendum)
Neil Mcintyre [id number] 02/04/1976  CARE TEAM:  PCP: Ria Bush, MD  Outpatient Care Team: Patient Care Team: Ria Bush, MD as PCP - General (Family Medicine) Michael Boston, MD as Consulting Physician (General Surgery) Annamaria Helling, DO as Consulting Physician (Gastroenterology)  Inpatient Treatment Team: Treatment Team: Attending Provider: Audley Hose, MD; Registered Nurse: Gailen Shelter, RN; Technician: Charlestine Massed, NT; Technician: Eduard Roux Team: Nolon Nations, MD; Consulting Physician: Michael Boston, MD   Problem List:   Active Problems:   * No active hospital problems. *   07/18/2022       POST-OPERATIVE DIAGNOSIS:   RECURRENT COLON BLEEDING AT SPLENIC FLEXURE INCARCERATED INCISIONAL HERNIA (4X3CM)   PROCEDURE:   -EXTENDED LEFT HEMICOLECTOMY -MOBILIZATION OF SPLENIC FLEXURE OF COLON -EN BLOC RESECTION OF COLORECTAL ANASTOMOSIS -OMENTECTOMY -REDUCTION & PRIMARY REPAIR OF INCISIONAL ANASTOMOSIS -INTRAOPERATIVE ASSESSMENT OF TISSUE VASCULAR PERFUSION USING ICG (indocyanine green) IMMUNOFLUORESCENCE -TRANSVERSUS ABDOMINIS PLANE (TAP) BLOCK - BILATERAL -RIGID PROCTOSCOPY   SURGEON:  Adin Hector, MD   OR FINDINGS:    Patient had moderate adhesions.  Large swath of greater omentum incarcerated in left lower quadrant paramedian incision from prior left colectomy.  Omentum reduced removed and primary repair done.   Thick inflamed irritated colon with large swath of dense adhesions at the splenic flexure and omentum.  Prior colorectal anastomosis noted at the rectosigmoid junction.  Resection done to the proximal mid transverse colon using a dominant right middle colic artery pedicle.   No obvious metastatic disease on visceral parietal peritoneum or liver.   It is a 42m EEA anastomosis (proximal mid transverse colon connected to proximal rectum.)  It rests 15 cm from the anal verge by rigid  proctoscopy.     Assessment   status post robotic distal left hemicolectomy for recurrent GI bleed at splenic flexure postop day #8.  Ecchymosis along right lower quadrant and flank not surprising that seen 1 week from surgery.  Pocket of gas near anastomosis raises suspicion of leak but clinically does not appear that way given improved white count, no shock, no peritonitis.  [facility]  Plan:   Clinically he does not look like he is leaking at all but given the concern raised by the CAT scan, will do contrasted enema CT.  Usually can do pelvis but will include abdomen since it may be higher up.    If there is no leak along there and he tolerates p.o., most likely can discharge with reassurance.  If there is concern for a leak treat this like a diverticulitis with perforation with bowel rest and IV antibiotics and see if this will seal since it does not seem massive.  If that fails then may require laparoscopic washout and diverting loop ileostomy.  Hopefully not as likely but we will see.   Ecchymosis on right flank not surprising.  Hemoglobin is stable and certainly not worse.  He is not in hemorrhagic shock.  I think that will resolve over time.  No evidence of any wound infection or abscess or digestive tract issue at this time.   I reviewed nursing notes, ED provider notes, last 24 h vitals and pain scores, last 48 h intake and output, last 24 h labs and trends, and last 24 h imaging results. I have reviewed this patient's available data, including medical history, events of note, test results, etc as part of my evaluation.  A significant portion of that time was spent in counseling.  Care during the described time interval was provided by me.  This care required moderate level of medical decision making.  07/25/2022    Subjective: (Chief complaint)  Patient noticed worsening ecchymosis.  Went to the ER to be evaluated.  They did labs and CT scan.  CT scan  raise concerns of extraluminal gas for possible leak.  Surgical consultation requested.  Patient notes that he has been eating fine.  He has had some loose bowel movements a couple times a day but taking Metamucil every morning.  That has not changed.  No nausea or vomiting.  Some soreness in his lower incision but usually controlled with medications.  Not worse.  No nausea or vomiting.  No fevers or chills.  No drainage from his incisions.  Otherwise feels relatively well only a week out.  Not on any blood thinners.  No sick contacts or travel history.  Objective:  Vital signs:  Vitals:   07/25/22 0315 07/25/22 0316 07/25/22 0701  BP: (!) 158/101    Pulse: (!) 104    Resp:  (!) 22   Temp: 98.6 F (37 C)  99.2 F (37.3 C)  TempSrc: Oral  Axillary  SpO2: 93%         Intake/Output   Yesterday:  No intake/output data recorded. This shift:  No intake/output data recorded.  Bowel function:  Flatus: YES  BM:  YES  Drain: (No drain)   Physical Exam:  General: Pt awake/alert in no acute distress.  Sitting up in bed with legs crossed looking at phone.  Smiling.  Not toxic.  Not sickly.  Calm.  Not agitated. Eyes: PERRL, normal EOM.  Sclera clear.  No icterus Neuro: CN II-XII intact w/o focal sensory/motor deficits. Lymph: No head/neck/groin lymphadenopathy Psych:  No delerium/psychosis/paranoia.  Oriented x 4 HENT: Normocephalic, Mucus membranes moist.  No thrush Neck: Supple, No tracheal deviation.  No obvious thyromegaly Chest: No pain to chest wall compression.  Good respiratory excursion.  No audible wheezing CV:  Pulses intact.  Regular rhythm.  No major extremity edema MS: Normal AROM mjr joints.  No obvious deformity  Abdomen: Soft.  Nondistended.  Mild discomfort at Pfannenstiel incision suprapubic.  Ice pack in place.  No cellulitis drainage or abscess.  Moderate volume of ecchymosis goes from right groin down right flank.  Less so on the left side..  No evidence of  peritonitis.  No incarcerated hernias.  Ext:   No deformity.  No mjr edema.  No cyanosis Skin: No petechiae / purpurea.  No major sores.  Warm and dry    Results:   Cultures: No results found for this or any previous visit (from the past 720 hour(s)).  Labs: Results for orders placed or performed during the hospital encounter of 07/25/22 (from the past 48 hour(s))  CBC with Differential     Status: Abnormal   Collection Time: 07/25/22  4:48 AM  Result Value Ref Range   WBC 12.8 (H) 4.0 - 10.5 K/uL   RBC 3.88 (L) 4.22 - 5.81 MIL/uL   Hemoglobin 10.4 (L) 13.0 - 17.0 g/dL   HCT 32.6 (L) 39.0 - 52.0 %   MCV 84.0 80.0 - 100.0 fL   MCH 26.8 26.0 - 34.0 pg   MCHC 31.9 30.0 - 36.0 g/dL   RDW 16.6 (H) 11.5 - 15.5 %   Platelets 529 (H) 150 - 400 K/uL   nRBC 0.2 0.0 - 0.2 %   Neutrophils Relative % 67 %   Neutro  Abs 8.6 (H) 1.7 - 7.7 K/uL   Lymphocytes Relative 9 %   Lymphs Abs 1.2 0.7 - 4.0 K/uL   Monocytes Relative 16 %   Monocytes Absolute 2.0 (H) 0.1 - 1.0 K/uL   Eosinophils Relative 4 %   Eosinophils Absolute 0.5 0.0 - 0.5 K/uL   Basophils Relative 0 %   Basophils Absolute 0.1 0.0 - 0.1 K/uL   Immature Granulocytes 4 %   Abs Immature Granulocytes 0.49 (H) 0.00 - 0.07 K/uL    Comment: Performed at [facility], [place] [address] [place], [place] [id number]  Basic metabolic panel     Status: Abnormal   Collection Time: 07/25/22  4:48 AM  Result Value Ref Range   Sodium 138 135 - 145 mmol/L   Potassium 3.0 (L) 3.5 - 5.1 mmol/L   Chloride 98 98 - 111 mmol/L   CO2 28 22 - 32 mmol/L   Glucose, Bld 112 (H) 70 - 99 mg/dL    Comment: Glucose reference range applies only to samples taken after fasting for at least 8 hours.   BUN 12 6 - 20 mg/dL   Creatinine, Ser 0.78 0.61 - 1.24 mg/dL   Calcium 9.0 8.9 - 10.3 mg/dL   GFR, Estimated >60 >60 mL/min    Comment: (NOTE) Calculated using the CKD-EPI Creatinine Equation (2021)    Anion gap 12 5 - 15    Comment:  Performed at [facility], [place] [address] [place], [place] [address]  Protime-INR     Status: None   Collection Time: 07/25/22  4:48 AM  Result Value Ref Range   Prothrombin Time 13.9 11.4 - 15.2 seconds   INR 1.1 0.8 - 1.2    Comment: (NOTE) INR goal varies based on device and disease states. Performed at [facility], [place] [address], [place], [place] [address]   Urinalysis, Routine w reflex microscopic -Urine, Clean Catch     Status: Abnormal   Collection Time: 07/25/22  6:55 AM  Result Value Ref Range   Color, Urine YELLOW YELLOW   APPearance CLEAR CLEAR   Specific Gravity, Urine 1.005 1.005 - 1.030   pH 5.0 5.0 - 8.0   Glucose, UA NEGATIVE NEGATIVE mg/dL   Hgb urine dipstick NEGATIVE NEGATIVE   Bilirubin Urine NEGATIVE NEGATIVE   Ketones, ur 20 (A) NEGATIVE mg/dL   Protein, ur NEGATIVE NEGATIVE mg/dL   Nitrite NEGATIVE NEGATIVE   Leukocytes,Ua NEGATIVE NEGATIVE    Comment: Performed at [facility] [address], [place], [place] [address]    Imaging / Studies: CT ABDOMEN PELVIS W CONTRAST  Result Date: 07/25/2022 CLINICAL DATA:  47 year old male with abdominal pain. History of GI bleeding requiring abdominal surgery. Postoperative day 7 status post extended left hemicolectomy, repair of anastomosis, and en bloc resection of colorectal anastomosis and omentum. Hospital discharge 3 days ago. EXAM: CT ABDOMEN AND PELVIS WITH CONTRAST TECHNIQUE: Multidetector CT imaging of the abdomen and pelvis was performed using the standard protocol following bolus administration of intravenous contrast. RADIATION DOSE REDUCTION: This exam was performed according to the departmental dose-optimization program which includes automated exposure control, adjustment of the mA and/or kV according to patient size and/or use of iterative reconstruction technique. CONTRAST:  169m OMNIPAQUE IOHEXOL 300 MG/ML  SOLN COMPARISON:  CTA abdomen and  pelvis 06/10/2022. FINDINGS: Lower chest: Platelike bilateral lung base atelectasis. No pericardial or pleural effusion. Hepatobiliary: Pneumoperitoneum scattered along the liver margins. Otherwise negative liver and gallbladder. Pancreas:  Negative. Spleen: Negative aside from adjacent pneumoperitoneum. Adrenals/Urinary Tract: Normal adrenal glands. Nonobstructed kidneys. Symmetric renal enhancement and contrast excretion on the delayed images. No hydronephrosis, and proximal ureters are normal. The left ureter passes through an area of mesenteric stranding along the distal colon but remains nonenlarged. Normal left ureterovesical junction. Unremarkable bladder. Stomach/Bowel: Mild to moderate stool ball in the rectum. Pelvic inlet large bowel anastomosis series 2, image 70, with adjacent elongated 3-4 cm area of extraluminal gas in the mesentery series 2, image 68. Regional mesenteric edema and/or inflammation tracking along that loop. Upstream of the anastomosis the large bowel is mostly decompressed, with some feces. Transverse colon is more gas distended. And there are numerous small foci of pneumoperitoneum in the bilateral upper abdomen along the greater omentum and underlying the diaphragm. Retained stool in the right colon to the hepatic flexure. Appendix appears normal on series 2, image 61. Negative terminal ileum. Gas and fluid containing small bowel loops are mostly decompressed in the abdomen. The stomach and duodenum are also mostly decompressed. Broad-based 7 cm area of mild to moderate mesenteric inflammation also in the left upper quadrant, former site of the splenic flexure. There is no organized or drainable fluid collection identified in the abdomen. Postoperative changes to the ventral abdominal wall, including a small volume of subcutaneous gas in the right lower abdomen series 2, image 84. Bilateral flank edema. No abdominal wall fluid collection. Vascular/Lymphatic: Major arterial structures  and the portal venous system in the abdomen and pelvis appear patent. Normal caliber abdominal aorta. Mild iliofemoral atherosclerosis. No lymphadenopathy identified. Reproductive: Negative. Other: Trace presacral fluid or edema. Musculoskeletal: No acute or suspicious osseous lesion. IMPRESSION: 1. Pneumoperitoneum, greater than expected for postoperative day 7, and with a suspicious 3-4 cm focus of gas within the mesentery adjacent to the distal large bowel anastomosis. Consider anastomotic leak, see coronal images 51 through 54. Recommend Surgery consultation. 2. Edema and/or inflammation in the mesentery at the splenic flexure and along the distal colon anastomosis in #1. No organized or drainable fluid collection in the abdomen. No evidence of bowel obstruction. 3. Mild lung base atelectasis. Electronically Signed   By: Genevie Ann M.D.   On: 07/25/2022 05:52    Medications / Allergies: per chart  Antibiotics: Anti-infectives (From admission, onward)    Start     Dose/Rate Route Frequency Ordered Stop   07/25/22 0800  piperacillin-tazobactam (ZOSYN) IVPB 3.375 g        3.375 g 12.5 mL/hr over 240 Minutes Intravenous Every 8 hours 07/25/22 0726 07/30/22 0759         Note: Portions of this report may have been transcribed using voice recognition software. Every effort was made to ensure accuracy; however, inadvertent computerized transcription errors may be present.   Any transcriptional errors that result from this process are unintentional.    Adin Hector, MD, FACS, MASCRS Esophageal, Gastrointestinal & Colorectal Surgery Robotic and Minimally Invasive Surgery  [facility]. [address] [place], [place] [address] [phone] Fax [phone] Main  CONTACT INFORMATION:  Weekday (9AM-5PM): Call [facility] main office at [phone]  Weeknight (5PM-9AM) or Weekend/Holiday: Check [email or web address] (password " [email or web address]") for  General Surgery [facility] coverage  (Please, do not use SecureChat as it is not reliable communication to reach operating surgeons for immediate patient care given surgeries/outpatient duties/clinic/cross-coverage/off post-call which would lead to a delay in care.  Epic staff messaging available for outptient concerns, but may not  be answered for 48 hours or more).     07/25/2022  8:00 AM

## 2022-07-25 NOTE — ED Notes (Signed)
ED TO INPATIENT HANDOFF REPORT  Name/Age/Gender Neil Mcintyre 47 y.o. male  Code Status    Code Status Orders  (From admission, onward)           Start     Ordered   07/25/22 1112  Full code  Continuous       Question:  By:  Answer:  Consent: discussion documented in EHR   07/25/22 1121           Code Status History     Date Active Date Inactive Code Status Order ID Comments User Context   07/18/2022 1546 07/22/2022 1614 Full Code [id number]  Michael Boston, MD Inpatient   06/09/2022 0016 06/13/2022 1559 Full Code [id number]  Elwyn Reach, MD ED   05/29/2015 0353 05/29/2015 1849 Full Code [id number]  Lance Coon, MD Inpatient   05/09/2015 1134 05/12/2015 1432 Full Code [id number]  Florene Glen, MD ED       Home/SNF/Other Home  Chief Complaint Diverticulitis [K57.92]  Level of Care/Admitting Diagnosis ED Disposition     ED Disposition  Admit   Condition  --   [place] [facility]: [facility] [[id number]]  Level of Care: Med-Surg [16]  May admit patient to [facility] or [facility] if equivalent level of care is available:: No  Covid Evaluation: Asymptomatic - no recent exposure (last 10 days) testing not required  Diagnosis: Diverticulitis [id number]  Admitting Physician: [place], [place]  Attending Physician: [place], [place]  Bed request comments: 3E  Certification:: I certify this patient will need inpatient services for at least 2 midnights  Estimated Length of Stay: 3          Medical History Past Medical History:  Diagnosis Date   Anxiety    COVID-19 05/10/2021   Depression    Diabetes mellitus without complication ([place])    Diverticulitis of large intestine with perforation with bleeding 2015, 2017   Laparoscopic sigmoid colectomy 2017   Genital herpes    Hypertension    Irritability and anger    due to stress    Allergies No Known Allergies  IV Location/Drains/Wounds Patient Lines/Drains/Airways  Status     Active Line/Drains/Airways     Name Placement date Placement time Site Days   Peripheral IV 07/25/22 20 G Right Antecubital 07/25/22  0452  Antecubital  less than 1   Incision - 5 Ports Abdomen Umbilicus Right Right;Lower Lower;Left Other (Comment);Mid 08/16/15  --  -- 2535   Incision - 6 Ports Abdomen 1: Right;Upper;Superior 2: Right;Upper 3: Right;Lateral;Upper 4: Right;Lower 5: Right;Lower;Distal 6: Left;Lateral 07/18/22  --  -- 7            Labs/Imaging Results for orders placed or performed during the hospital encounter of 07/25/22 (from the past 48 hour(s))  CBC with Differential     Status: Abnormal   Collection Time: 07/25/22  4:48 AM  Result Value Ref Range   WBC 12.8 (H) 4.0 - 10.5 K/uL   RBC 3.88 (L) 4.22 - 5.81 MIL/uL   Hemoglobin 10.4 (L) 13.0 - 17.0 g/dL   HCT 32.6 (L) 39.0 - 52.0 %   MCV 84.0 80.0 - 100.0 fL   MCH 26.8 26.0 - 34.0 pg   MCHC 31.9 30.0 - 36.0 g/dL   RDW 16.6 (H) 11.5 - 15.5 %   Platelets 529 (H) 150 - 400 K/uL   nRBC 0.2 0.0 - 0.2 %   Neutrophils Relative % 67 %   Neutro Abs 8.6 (  H) 1.7 - 7.7 K/uL   Lymphocytes Relative 9 %   Lymphs Abs 1.2 0.7 - 4.0 K/uL   Monocytes Relative 16 %   Monocytes Absolute 2.0 (H) 0.1 - 1.0 K/uL   Eosinophils Relative 4 %   Eosinophils Absolute 0.5 0.0 - 0.5 K/uL   Basophils Relative 0 %   Basophils Absolute 0.1 0.0 - 0.1 K/uL   Immature Granulocytes 4 %   Abs Immature Granulocytes 0.49 (H) 0.00 - 0.07 K/uL    Comment: Performed at [facility], [place] [address], [place], [place] [id number]  Basic metabolic panel     Status: Abnormal   Collection Time: 07/25/22  4:48 AM  Result Value Ref Range   Sodium 138 135 - 145 mmol/L   Potassium 3.0 (L) 3.5 - 5.1 mmol/L   Chloride 98 98 - 111 mmol/L   CO2 28 22 - 32 mmol/L   Glucose, Bld 112 (H) 70 - 99 mg/dL    Comment: Glucose reference range applies only to samples taken after fasting for at least 8 hours.   BUN 12 6 - 20 mg/dL    Creatinine, Ser 0.78 0.61 - 1.24 mg/dL   Calcium 9.0 8.9 - 10.3 mg/dL   GFR, Estimated >60 >60 mL/min    Comment: (NOTE) Calculated using the CKD-EPI Creatinine Equation (2021)    Anion gap 12 5 - 15    Comment: Performed at [facility], [place] [address], [place], [place] [address]  Protime-INR     Status: None   Collection Time: 07/25/22  4:48 AM  Result Value Ref Range   Prothrombin Time 13.9 11.4 - 15.2 seconds   INR 1.1 0.8 - 1.2    Comment: (NOTE) INR goal varies based on device and disease states. Performed at [facility], [place] [address], [place], [place] [address]   Urinalysis, Routine w reflex microscopic -Urine, Clean Catch     Status: Abnormal   Collection Time: 07/25/22  6:55 AM  Result Value Ref Range   Color, Urine YELLOW YELLOW   APPearance CLEAR CLEAR   Specific Gravity, Urine 1.005 1.005 - 1.030   pH 5.0 5.0 - 8.0   Glucose, UA NEGATIVE NEGATIVE mg/dL   Hgb urine dipstick NEGATIVE NEGATIVE   Bilirubin Urine NEGATIVE NEGATIVE   Ketones, ur 20 (A) NEGATIVE mg/dL   Protein, ur NEGATIVE NEGATIVE mg/dL   Nitrite NEGATIVE NEGATIVE   Leukocytes,Ua NEGATIVE NEGATIVE    Comment: Performed at [place] [address] [place], [place] [address]   CT ABDOMEN PELVIS W CONTRAST  Result Date: 07/25/2022 CLINICAL DATA:  Post extended LEFT hemicolectomy on 07/18/2012, postop day 7, gas at colorectal anastomosis question delayed leak EXAM: CT ABDOMEN AND PELVIS WITH CONTRAST TECHNIQUE: Multidetector CT imaging of the abdomen and pelvis was performed using the standard protocol following bolus administration of intravenous contrast. RADIATION DOSE REDUCTION: This exam was performed according to the departmental dose-optimization program which includes automated exposure control, adjustment of the mA and/or kV according to patient size and/or use of iterative reconstruction technique. CONTRAST:  116m OMNIPAQUE IOHEXOL 300  MG/ML SOLN IV. Rectal contrast. COMPARISON:  Earlier study of 07/25/2022 FINDINGS: Lower chest: Bibasilar atelectasis Hepatobiliary: Gallbladder and liver normal appearance Pancreas: Normal appearance Spleen: Normal appearance Adrenals/Urinary Tract: Tiny cyst inferior pole RIGHT kidney again seen, 10 mm diameter; no follow-up imaging recommended. Adrenal glands, kidneys, ureters, and bladder otherwise normal appearance Stomach/Bowel: Post LEFT hemicolectomy with sigmoid anastomosis. Mild colonic  wall thickening at anastomosis. Small extraluminal gas collection seen adjacent to the anastomosis on the previous exam is smaller in size and now contains a small amount of contrast consistent with anastomotic leak. Stomach and remaining bowel loops normal appearance. Vascular/Lymphatic: Vascular structures patent.  No adenopathy. Reproductive: Unremarkable prostate gland and seminal vesicles Other: Persistent significant edema and extraluminal gas adjacent to sigmoid colon. Significant free air. Expected postsurgical changes along LEFT colon bed at site of resection. No ascites. No hernia. Musculoskeletal: Unremarkable IMPRESSION: Post LEFT hemicolectomy with sigmoid anastomosis. Small extraluminal gas collection seen adjacent to the anastomosis on the previous exam is smaller in size and now contains a small amount of contrast consistent with anastomotic leak. Persistent significant edema and extraluminal gas adjacent to the sigmoid colon as well as persistent free air. Electronically Signed   By: Lavonia Dana M.D.   On: 07/25/2022 10:52   CT ABDOMEN PELVIS W CONTRAST  Result Date: 07/25/2022 CLINICAL DATA:  47 year old male with abdominal pain. History of GI bleeding requiring abdominal surgery. Postoperative day 7 status post extended left hemicolectomy, repair of anastomosis, and en bloc resection of colorectal anastomosis and omentum. Hospital discharge 3 days ago. EXAM: CT ABDOMEN AND PELVIS WITH CONTRAST  TECHNIQUE: Multidetector CT imaging of the abdomen and pelvis was performed using the standard protocol following bolus administration of intravenous contrast. RADIATION DOSE REDUCTION: This exam was performed according to the departmental dose-optimization program which includes automated exposure control, adjustment of the mA and/or kV according to patient size and/or use of iterative reconstruction technique. CONTRAST:  144m OMNIPAQUE IOHEXOL 300 MG/ML  SOLN COMPARISON:  CTA abdomen and pelvis 06/10/2022. FINDINGS: Lower chest: Platelike bilateral lung base atelectasis. No pericardial or pleural effusion. Hepatobiliary: Pneumoperitoneum scattered along the liver margins. Otherwise negative liver and gallbladder. Pancreas: Negative. Spleen: Negative aside from adjacent pneumoperitoneum. Adrenals/Urinary Tract: Normal adrenal glands. Nonobstructed kidneys. Symmetric renal enhancement and contrast excretion on the delayed images. No hydronephrosis, and proximal ureters are normal. The left ureter passes through an area of mesenteric stranding along the distal colon but remains nonenlarged. Normal left ureterovesical junction. Unremarkable bladder. Stomach/Bowel: Mild to moderate stool ball in the rectum. Pelvic inlet large bowel anastomosis series 2, image 70, with adjacent elongated 3-4 cm area of extraluminal gas in the mesentery series 2, image 68. Regional mesenteric edema and/or inflammation tracking along that loop. Upstream of the anastomosis the large bowel is mostly decompressed, with some feces. Transverse colon is more gas distended. And there are numerous small foci of pneumoperitoneum in the bilateral upper abdomen along the greater omentum and underlying the diaphragm. Retained stool in the right colon to the hepatic flexure. Appendix appears normal on series 2, image 61. Negative terminal ileum. Gas and fluid containing small bowel loops are mostly decompressed in the abdomen. The stomach and  duodenum are also mostly decompressed. Broad-based 7 cm area of mild to moderate mesenteric inflammation also in the left upper quadrant, former site of the splenic flexure. There is no organized or drainable fluid collection identified in the abdomen. Postoperative changes to the ventral abdominal wall, including a small volume of subcutaneous gas in the right lower abdomen series 2, image 84. Bilateral flank edema. No abdominal wall fluid collection. Vascular/Lymphatic: Major arterial structures and the portal venous system in the abdomen and pelvis appear patent. Normal caliber abdominal aorta. Mild iliofemoral atherosclerosis. No lymphadenopathy identified. Reproductive: Negative. Other: Trace presacral fluid or edema. Musculoskeletal: No acute or suspicious osseous lesion. IMPRESSION: 1. Pneumoperitoneum, greater than expected  for postoperative day 7, and with a suspicious 3-4 cm focus of gas within the mesentery adjacent to the distal large bowel anastomosis. Consider anastomotic leak, see coronal images 51 through 54. Recommend Surgery consultation. 2. Edema and/or inflammation in the mesentery at the splenic flexure and along the distal colon anastomosis in #1. No organized or drainable fluid collection in the abdomen. No evidence of bowel obstruction. 3. Mild lung base atelectasis. Electronically Signed   By: Genevie Ann M.D.   On: 07/25/2022 05:52    Pending Labs Unresulted Labs (From admission, onward)     Start     Ordered   08/01/22 0500  Creatinine, serum  (enoxaparin (LOVENOX)    CrCl >/= 30 ml/min)  Weekly,   R     Comments: while on enoxaparin therapy    07/25/22 1121   07/26/22 0500  CBC  Daily,   R      07/25/22 1125   07/26/22 0500  Potassium  Tomorrow morning,   R        07/25/22 1125   07/25/22 1127  Magnesium  Add-on,   AD       Comments: If magnesium level 1.5- 1.9, give 2 g IV magnesium sulfateIf magnesium level <1.5, give 4 g IV magnesium sulfate    07/25/22 1126   07/25/22  0704  Blood culture (routine x 2)  BLOOD CULTURE X 2,   R (with STAT occurrences)     Question Answer Comment  Patient immune status Normal   Release to patient Immediate      07/25/22 0703            Vitals/Pain Today's Vitals   07/25/22 0730 07/25/22 1042 07/25/22 1043 07/25/22 1100  BP: (!) 158/90  (!) 151/96 (!) 150/83  Pulse: 97 97 98 97  Resp: 19  18   Temp:   (!) 97.4 F (36.3 C)   TempSrc:   Oral   SpO2: 96% 97% 95% 97%  PainSc:        Isolation Precautions No active isolations  Medications Medications  sodium chloride (PF) 0.9 % injection (has no administration in time range)  lactated ringers bolus 1,000 mL (has no administration in time range)  piperacillin-tazobactam (ZOSYN) IVPB 3.375 g (3.375 g Intravenous New Bag/Given 07/25/22 0913)  iohexol (OMNIPAQUE) 300 MG/ML solution 30 mL (has no administration in time range)  sodium chloride (PF) 0.9 % injection (has no administration in time range)  lactated ringers infusion (has no administration in time range)  HYDROmorphone (DILAUDID) injection 0.5-2 mg (has no administration in time range)  diphenhydrAMINE (BENADRYL) 12.5 MG/5ML elixir 12.5 mg (has no administration in time range)    Or  diphenhydrAMINE (BENADRYL) injection 12.5 mg (has no administration in time range)  ondansetron (ZOFRAN-ODT) disintegrating tablet 4 mg (has no administration in time range)    Or  ondansetron (ZOFRAN) injection 4 mg (has no administration in time range)  prochlorperazine (COMPAZINE) tablet 10 mg (has no administration in time range)    Or  prochlorperazine (COMPAZINE) injection 5-10 mg (has no administration in time range)  simethicone (MYLICON) chewable tablet 40 mg (has no administration in time range)  metoprolol tartrate (LOPRESSOR) injection 5 mg (has no administration in time range)  enoxaparin (LOVENOX) injection 40 mg (has no administration in time range)  lactated ringers bolus 1,000 mL (has no administration in  time range)  methocarbamol (ROBAXIN) 1,000 mg in dextrose 5 % 100 mL IVPB (has no administration in time range)  methocarbamol (ROBAXIN) tablet 1,000 mg (has no administration in time range)  acetaminophen (TYLENOL) tablet 325-650 mg (has no administration in time range)  lip balm (CARMEX) ointment (has no administration in time range)  phenol (CHLORASEPTIC) mouth spray 2 spray (has no administration in time range)  menthol-cetylpyridinium (CEPACOL) lozenge 3 mg (has no administration in time range)  magic mouthwash (has no administration in time range)  alum & mag hydroxide-simeth (MAALOX/MYLANTA) 200-200-20 MG/5ML suspension 30 mL (has no administration in time range)  potassium chloride 10 mEq in 100 mL IVPB (has no administration in time range)  potassium chloride SA (KLOR-CON M) CR tablet 40 mEq (has no administration in time range)  iohexol (OMNIPAQUE) 300 MG/ML solution 100 mL (100 mLs Intravenous Contrast Given 07/25/22 0526)  lactated ringers bolus 1,000 mL (0 mLs Intravenous Stopped 07/25/22 0845)  iohexol (OMNIPAQUE) 300 MG/ML solution 100 mL (100 mLs Intravenous Contrast Given 07/25/22 1010)    Mobility walks

## 2022-07-25 NOTE — ED Provider Notes (Signed)
[place] [facility] Provider Note   CSN: [id number] Arrival date & time: 07/25/22  0302     History  Chief Complaint  Patient presents with   Post-op Problem    Neil Mcintyre is a 47 y.o. male.  The history is provided by the patient.  Illness Location:  Right flank Quality:  Ecchymosis and distention, started at hospital discharge Severity:  Moderate Onset quality:  Gradual Duration: days 3. Timing:  Constant Progression:  Worsening Chronicity:  New Context:  Surgery on 2/15 for hernia repair partial colectomy Relieved by:  Nothing Worsened by:  Time Ineffective treatments:  None tried Associated symptoms: no chest pain, no fever, no shortness of breath, no vomiting and no wheezing   POD 7 from partial colectomy and hernia repair presents with distention and ecchymosis of the R flank.  No fevers, no n/v/d.       Home Medications Prior to Admission medications   Medication Sig Start Date End Date Taking? Authorizing Provider  amLODipine (NORVASC) 5 MG tablet Take 1 tablet (5 mg total) by mouth daily. 02/25/22   Ria Bush, MD  buPROPion (WELLBUTRIN XL) 150 MG 24 hr tablet Take 1 tablet (150 mg total) by mouth daily. 02/25/22   Ria Bush, MD  cyanocobalamin (VITAMIN B12) 500 MCG tablet Take 1 tablet (500 mcg total) by mouth daily. 06/20/22   Ria Bush, MD  ferrous sulfate 325 (65 FE) MG tablet Take 1 tablet (325 mg total) by mouth daily. Patient not taking: Reported on 07/08/2022 06/13/22 07/13/22  Sharen Hones, MD  methocarbamol (ROBAXIN) 500 MG tablet Take 1 tablet (500 mg) by mouth every 8 hours as needed for muscle spasms. 07/18/22   Michael Boston, MD  metoprolol succinate (TOPROL-XL) 25 MG 24 hr tablet Take 1 tablet (25 mg total) by mouth daily. 02/25/22   Ria Bush, MD  oxyCODONE (OXY IR/ROXICODONE) 5 MG immediate release tablet Take 1 - 2 tablets (5 - 10 mg total) by mouth every 6 hours as needed for  breakthrough pain or severe pain. 07/22/22   Michael Boston, MD  valACYclovir (VALTREX) 500 MG tablet Take 1 tablet (500 mg total) by mouth daily. 05/24/22   Ria Bush, MD      Allergies    Patient has no known allergies.    Review of Systems   Review of Systems  Constitutional:  Negative for fever.  HENT:  Negative for facial swelling.   Respiratory:  Negative for shortness of breath and wheezing.   Cardiovascular:  Negative for chest pain.  Gastrointestinal:  Negative for vomiting.  Neurological:  Negative for facial asymmetry.  All other systems reviewed and are negative.   Physical Exam Updated Vital Signs BP (!) 158/101   Pulse (!) 104   Temp 98.6 F (37 C) (Oral)   Resp (!) 22   SpO2 93%  Physical Exam Vitals reviewed.  Constitutional:      General: He is not in acute distress.    Appearance: He is well-developed. He is not diaphoretic.  HENT:     Head: Normocephalic and atraumatic.  Eyes:     Conjunctiva/sclera: Conjunctivae normal.     Pupils: Pupils are equal, round, and reactive to light.  Cardiovascular:     Rate and Rhythm: Normal rate and regular rhythm.  Pulmonary:     Effort: Pulmonary effort is normal.     Breath sounds: Normal breath sounds. No wheezing or rales.  Abdominal:     General:  There is distension.     Palpations: Abdomen is soft.     Tenderness: There is no abdominal tenderness. There is no guarding or rebound.    Musculoskeletal:        General: Normal range of motion.     Cervical back: Normal range of motion and neck supple.  Skin:    General: Skin is warm and dry.     Capillary Refill: Capillary refill takes less than 2 seconds.  Neurological:     General: No focal deficit present.     Mental Status: He is alert and oriented to person, place, and time.     Deep Tendon Reflexes: Reflexes normal.  Psychiatric:        Mood and Affect: Mood normal.        Behavior: Behavior normal.     ED Results / Procedures /  Treatments   Labs (all labs ordered are listed, but only abnormal results are displayed) Results for orders placed or performed during the hospital encounter of 07/25/22  CBC with Differential  Result Value Ref Range   WBC 12.8 (H) 4.0 - 10.5 K/uL   RBC 3.88 (L) 4.22 - 5.81 MIL/uL   Hemoglobin 10.4 (L) 13.0 - 17.0 g/dL   HCT 32.6 (L) 39.0 - 52.0 %   MCV 84.0 80.0 - 100.0 fL   MCH 26.8 26.0 - 34.0 pg   MCHC 31.9 30.0 - 36.0 g/dL   RDW 16.6 (H) 11.5 - 15.5 %   Platelets 529 (H) 150 - 400 K/uL   nRBC 0.2 0.0 - 0.2 %   Neutrophils Relative % 67 %   Neutro Abs 8.6 (H) 1.7 - 7.7 K/uL   Lymphocytes Relative 9 %   Lymphs Abs 1.2 0.7 - 4.0 K/uL   Monocytes Relative 16 %   Monocytes Absolute 2.0 (H) 0.1 - 1.0 K/uL   Eosinophils Relative 4 %   Eosinophils Absolute 0.5 0.0 - 0.5 K/uL   Basophils Relative 0 %   Basophils Absolute 0.1 0.0 - 0.1 K/uL   Immature Granulocytes 4 %   Abs Immature Granulocytes 0.49 (H) 0.00 - 0.07 K/uL  Basic metabolic panel  Result Value Ref Range   Sodium 138 135 - 145 mmol/L   Potassium 3.0 (L) 3.5 - 5.1 mmol/L   Chloride 98 98 - 111 mmol/L   CO2 28 22 - 32 mmol/L   Glucose, Bld 112 (H) 70 - 99 mg/dL   BUN 12 6 - 20 mg/dL   Creatinine, Ser 0.78 0.61 - 1.24 mg/dL   Calcium 9.0 8.9 - 10.3 mg/dL   GFR, Estimated >60 >60 mL/min   Anion gap 12 5 - 15  Protime-INR  Result Value Ref Range   Prothrombin Time 13.9 11.4 - 15.2 seconds   INR 1.1 0.8 - 1.2   CT ABDOMEN PELVIS W CONTRAST  Result Date: 07/25/2022 CLINICAL DATA:  47 year old male with abdominal pain. History of GI bleeding requiring abdominal surgery. Postoperative day 7 status post extended left hemicolectomy, repair of anastomosis, and en bloc resection of colorectal anastomosis and omentum. Hospital discharge 3 days ago. EXAM: CT ABDOMEN AND PELVIS WITH CONTRAST TECHNIQUE: Multidetector CT imaging of the abdomen and pelvis was performed using the standard protocol following bolus administration of  intravenous contrast. RADIATION DOSE REDUCTION: This exam was performed according to the departmental dose-optimization program which includes automated exposure control, adjustment of the mA and/or kV according to patient size and/or use of iterative reconstruction technique. CONTRAST:  181m OMNIPAQUE  IOHEXOL 300 MG/ML  SOLN COMPARISON:  CTA abdomen and pelvis 06/10/2022. FINDINGS: Lower chest: Platelike bilateral lung base atelectasis. No pericardial or pleural effusion. Hepatobiliary: Pneumoperitoneum scattered along the liver margins. Otherwise negative liver and gallbladder. Pancreas: Negative. Spleen: Negative aside from adjacent pneumoperitoneum. Adrenals/Urinary Tract: Normal adrenal glands. Nonobstructed kidneys. Symmetric renal enhancement and contrast excretion on the delayed images. No hydronephrosis, and proximal ureters are normal. The left ureter passes through an area of mesenteric stranding along the distal colon but remains nonenlarged. Normal left ureterovesical junction. Unremarkable bladder. Stomach/Bowel: Mild to moderate stool ball in the rectum. Pelvic inlet large bowel anastomosis series 2, image 70, with adjacent elongated 3-4 cm area of extraluminal gas in the mesentery series 2, image 68. Regional mesenteric edema and/or inflammation tracking along that loop. Upstream of the anastomosis the large bowel is mostly decompressed, with some feces. Transverse colon is more gas distended. And there are numerous small foci of pneumoperitoneum in the bilateral upper abdomen along the greater omentum and underlying the diaphragm. Retained stool in the right colon to the hepatic flexure. Appendix appears normal on series 2, image 61. Negative terminal ileum. Gas and fluid containing small bowel loops are mostly decompressed in the abdomen. The stomach and duodenum are also mostly decompressed. Broad-based 7 cm area of mild to moderate mesenteric inflammation also in the left upper quadrant, former  site of the splenic flexure. There is no organized or drainable fluid collection identified in the abdomen. Postoperative changes to the ventral abdominal wall, including a small volume of subcutaneous gas in the right lower abdomen series 2, image 84. Bilateral flank edema. No abdominal wall fluid collection. Vascular/Lymphatic: Major arterial structures and the portal venous system in the abdomen and pelvis appear patent. Normal caliber abdominal aorta. Mild iliofemoral atherosclerosis. No lymphadenopathy identified. Reproductive: Negative. Other: Trace presacral fluid or edema. Musculoskeletal: No acute or suspicious osseous lesion. IMPRESSION: 1. Pneumoperitoneum, greater than expected for postoperative day 7, and with a suspicious 3-4 cm focus of gas within the mesentery adjacent to the distal large bowel anastomosis. Consider anastomotic leak, see coronal images 51 through 54. Recommend Surgery consultation. 2. Edema and/or inflammation in the mesentery at the splenic flexure and along the distal colon anastomosis in #1. No organized or drainable fluid collection in the abdomen. No evidence of bowel obstruction. 3. Mild lung base atelectasis. Electronically Signed   By: Genevie Ann M.D.   On: 07/25/2022 05:52   DG C-Arm 1-60 Min-No Report  Result Date: 07/18/2022 Fluoroscopy was utilized by the requesting physician.  No radiographic interpretation.     Radiology CT ABDOMEN PELVIS W CONTRAST  Result Date: 07/25/2022 CLINICAL DATA:  47 year old male with abdominal pain. History of GI bleeding requiring abdominal surgery. Postoperative day 7 status post extended left hemicolectomy, repair of anastomosis, and en bloc resection of colorectal anastomosis and omentum. Hospital discharge 3 days ago. EXAM: CT ABDOMEN AND PELVIS WITH CONTRAST TECHNIQUE: Multidetector CT imaging of the abdomen and pelvis was performed using the standard protocol following bolus administration of intravenous contrast. RADIATION DOSE  REDUCTION: This exam was performed according to the departmental dose-optimization program which includes automated exposure control, adjustment of the mA and/or kV according to patient size and/or use of iterative reconstruction technique. CONTRAST:  124m OMNIPAQUE IOHEXOL 300 MG/ML  SOLN COMPARISON:  CTA abdomen and pelvis 06/10/2022. FINDINGS: Lower chest: Platelike bilateral lung base atelectasis. No pericardial or pleural effusion. Hepatobiliary: Pneumoperitoneum scattered along the liver margins. Otherwise negative liver and gallbladder. Pancreas: Negative. Spleen:  Negative aside from adjacent pneumoperitoneum. Adrenals/Urinary Tract: Normal adrenal glands. Nonobstructed kidneys. Symmetric renal enhancement and contrast excretion on the delayed images. No hydronephrosis, and proximal ureters are normal. The left ureter passes through an area of mesenteric stranding along the distal colon but remains nonenlarged. Normal left ureterovesical junction. Unremarkable bladder. Stomach/Bowel: Mild to moderate stool ball in the rectum. Pelvic inlet large bowel anastomosis series 2, image 70, with adjacent elongated 3-4 cm area of extraluminal gas in the mesentery series 2, image 68. Regional mesenteric edema and/or inflammation tracking along that loop. Upstream of the anastomosis the large bowel is mostly decompressed, with some feces. Transverse colon is more gas distended. And there are numerous small foci of pneumoperitoneum in the bilateral upper abdomen along the greater omentum and underlying the diaphragm. Retained stool in the right colon to the hepatic flexure. Appendix appears normal on series 2, image 61. Negative terminal ileum. Gas and fluid containing small bowel loops are mostly decompressed in the abdomen. The stomach and duodenum are also mostly decompressed. Broad-based 7 cm area of mild to moderate mesenteric inflammation also in the left upper quadrant, former site of the splenic flexure. There is  no organized or drainable fluid collection identified in the abdomen. Postoperative changes to the ventral abdominal wall, including a small volume of subcutaneous gas in the right lower abdomen series 2, image 84. Bilateral flank edema. No abdominal wall fluid collection. Vascular/Lymphatic: Major arterial structures and the portal venous system in the abdomen and pelvis appear patent. Normal caliber abdominal aorta. Mild iliofemoral atherosclerosis. No lymphadenopathy identified. Reproductive: Negative. Other: Trace presacral fluid or edema. Musculoskeletal: No acute or suspicious osseous lesion. IMPRESSION: 1. Pneumoperitoneum, greater than expected for postoperative day 7, and with a suspicious 3-4 cm focus of gas within the mesentery adjacent to the distal large bowel anastomosis. Consider anastomotic leak, see coronal images 51 through 54. Recommend Surgery consultation. 2. Edema and/or inflammation in the mesentery at the splenic flexure and along the distal colon anastomosis in #1. No organized or drainable fluid collection in the abdomen. No evidence of bowel obstruction. 3. Mild lung base atelectasis. Electronically Signed   By: Genevie Ann M.D.   On: 07/25/2022 05:52    Procedures Procedures    Medications Ordered in ED Medications  sodium chloride (PF) 0.9 % injection (has no administration in time range)  iohexol (OMNIPAQUE) 300 MG/ML solution 100 mL (100 mLs Intravenous Contrast Given 07/25/22 0526)    ED Course/ Medical Decision Making/ A&P                             Medical Decision Making Patient with ecchymosis and distention of the abdomen since discharge, has not spoken to surgery   Amount and/or Complexity of Data Reviewed Independent Historian: parent External Data Reviewed: labs and notes.    Details: Previous hospital notes and labs reviewed  Labs: ordered.    Details: All labs reviewed:  white count elevated 12.8, hemoglobin low 10.4, Elevated platelets 529.  Sodium is  normal 138, potassium 3 slight low, normal creatinine .78, normal coagulation studies  Radiology: ordered and independent interpretation performed.    Details: Post op air in the abdomen  Discussion of management or test interpretation with external provider(s): Case d/w Legrand Como [facility] PA [facility] will see the patient   Risk Prescription drug management.    Final Clinical Impression(s) / ED Diagnoses Final diagnoses:  None   Signed out to Dr. Mayra Neer  pending surgery consult. Surgery to see the patient  Rx / DC Orders ED Discharge Orders     None         Lametria Klunk, MD 07/25/22 [phone]

## 2022-07-25 NOTE — ED Notes (Signed)
Pt's oxygen noted to be in the upper 80s - RN notified and came to bedside. Pt placed on 2lpm via [place] with oxygen saturation improving to upper 90s.

## 2022-07-25 NOTE — ED Notes (Signed)
Assumed care of patient. Patient resting comfortably in bed with no signs of acute distress noted. Dara, Paramedic at bedside to draw blood cultures. Pt has no complaints at this time.

## 2022-07-25 NOTE — H&P (Signed)
Note: Portions of this report may have been transcribed using voice recognition software. Every effort was made to ensure accuracy; however, inadvertent computerized transcription errors may be present.   Any transcriptional errors that result from this process are unintentional.              Neil Mcintyre  [birth year] [id number]  Patient Care Team: Ria Bush, MD as PCP - General (Family Medicine) Michael Boston, MD as Consulting Physician (General Surgery) Annamaria Helling, DO as Consulting Physician (Gastroenterology)  (See prior consult note)    Patient Active Problem List   Diagnosis Date Noted   Diverticulitis 07/25/2022   Diverticulitis of colon with bleeding 07/18/2022   Diabetes mellitus type 2, noninsulin dependent ([name]) 07/02/2022   Engages in [place] 07/02/2022   Colonic hemorrhage 07/02/2022   Incarcerated incisional hernia 07/02/2022   Diverticulosis of colon 07/02/2022   Vitamin B12 deficiency 06/20/2022   Acute blood loss anemia 06/12/2022   Iron deficiency anemia 06/12/2022   Hypokalemia 06/09/2022   Lower GI bleed 06/09/2022   Left-sided headache 02/25/2022   Seasonal allergic rhinitis 09/07/2019   Enlargement of right palatine tonsil 09/07/2019   Genital herpes    Weak urinary stream 09/04/2017   Mood disorder ([name] 02/21/2017   Health maintenance examination 10/30/2016   Type 2 diabetes mellitus with other specified complication ([name]) [id number]   Dyslipidemia 10/30/2016   Obesity, morbid, BMI 40.0-49.9 ([name]) 07/29/2016   Anxiety 02/21/2015   Primary hypertension 02/21/2015    Past Medical History:  Diagnosis Date   Anxiety    COVID-19 05/10/2021   Depression    Diabetes mellitus without complication ([place])    Diverticulitis of large intestine with perforation with bleeding 2015, 2017   Laparoscopic sigmoid colectomy 2017   Genital herpes    Hypertension    Irritability and anger    due to stress    Past Surgical History:   Procedure Laterality Date   COLON RESECTION  07/2022   robotic extended left hemicolectomy colon resection with incarcerated incisional hernia repair ([name])   COLONOSCOPY W/ POLYPECTOMY  09/2014   diverticulosis ([place])   COLONOSCOPY WITH PROPOFOL N/A 06/11/2022   2 small polyps not removed, diverticulosis, healthy anastomosis, non-bleeding int hem [name], [name])   CYSTOSCOPY W/ RETROGRADES Bilateral 08/16/2015   [name], MD   CYSTOSCOPY WITH STENT PLACEMENT Bilateral 08/16/2015   [name], MD   LAPAROSCOPIC SIGMOID COLECTOMY N/A 08/16/2015   recurrent diverticulitis; [name], MD   PROCTOSCOPY N/A 07/18/2022   Procedure: RIGID PROCTOSCOPY;  Surgeon: Michael Boston, MD;  Location: [facility];  Service: General;  Laterality: N/A;   REFRACTIVE SURGERY  1999   LASIK    Social History   Socioeconomic History   Marital status: Married    Spouse name: Not on file   Number of children: Not on file   Years of education: Not on file   Highest education level: Not on file  Occupational History   Not on file  Tobacco Use   Smoking status: Never   Smokeless tobacco: Former    Types: Chew, Snuff  Vaping Use   Vaping Use: Former  Substance and Sexual Activity   Alcohol use: Yes    Alcohol/week: 1.0 standard drink of alcohol    Types: 1 Cans of beer per week    Comment: socially   Drug use: No    Comment: some in HS   Sexual activity: Yes    Birth control/protection: None  Other  Topics Concern   Not on file  Social History Narrative   Lives with wife, 2 children and mother   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   Edu: tech school   Activity: active at work   Diet: on keto diet   Social Determinants of Health   Financial Resource Strain: Not on file  Food Insecurity: No Food Insecurity (07/25/2022)   Hunger Vital Sign    Worried About Running Out of Food in the Last Year: Never true    [place] in the Last Year: Never true  Transportation Needs: No  Transportation Needs (07/25/2022)   PRAPARE - Hydrologist (Medical): No    Lack of Transportation (Non-Medical): No  Physical Activity: Not on file  Stress: Not on file  Social Connections: Not on file  Intimate Partner Violence: Not At Risk (07/25/2022)   Humiliation, Afraid, Rape, and Kick questionnaire    Fear of Current or Ex-Partner: No    Emotionally Abused: No    Physically Abused: No    Sexually Abused: No    Family History  Adopted: Yes  Family history unknown: Yes    Current Facility-Administered Medications  Medication Dose Route Frequency Provider Last Rate Last Admin   acetaminophen (TYLENOL) tablet 325-650 mg  325-650 mg Oral Q6H PRN Michael Boston, MD       alum & mag hydroxide-simeth (MAALOX/MYLANTA) 200-200-20 MG/5ML suspension 30 mL  30 mL Oral Q6H PRN Michael Boston, MD       diphenhydrAMINE (BENADRYL) 12.5 MG/5ML elixir 12.5 mg  12.5 mg Oral Q6H PRN Michael Boston, MD       Or   diphenhydrAMINE (BENADRYL) injection 12.5 mg  12.5 mg Intravenous Q6H PRN Michael Boston, MD       enoxaparin (LOVENOX) injection 40 mg  40 mg Subcutaneous Q24H Michael Boston, MD   40 mg at 07/25/22 1404   HYDROmorphone (DILAUDID) injection 0.5-2 mg  0.5-2 mg Intravenous Q2H PRN Michael Boston, MD   1 mg at 07/25/22 1258   iohexol (OMNIPAQUE) 300 MG/ML solution 30 mL  30 mL Oral Once PRN Michael Boston, MD       lactated ringers bolus 1,000 mL  1,000 mL Intravenous Q8H PRN Senta Kantor, [name], MD       lactated ringers bolus 1,000 mL  1,000 mL Intravenous Q8H PRN Michael Boston, MD       lactated ringers infusion   Intravenous Continuous Michael Boston, MD 50 mL/hr at 07/25/22 1301 New Bag at 07/25/22 1301   lip balm (CARMEX) ointment   Topical BID Michael Boston, MD   Given at 07/25/22 1258   magic mouthwash  15 mL Oral QID PRN Michael Boston, MD       menthol-cetylpyridinium (CEPACOL) lozenge 3 mg  1 lozenge Oral PRN Michael Boston, MD       methocarbamol (ROBAXIN) 1,000 mg  in dextrose 5 % 100 mL IVPB  1,000 mg Intravenous Q6H PRN Michael Boston, MD       methocarbamol (ROBAXIN) tablet 1,000 mg  1,000 mg Oral Q6H PRN Michael Boston, MD       metoprolol tartrate (LOPRESSOR) injection 5 mg  5 mg Intravenous Q6H PRN Michael Boston, MD       ondansetron (ZOFRAN-ODT) disintegrating tablet 4 mg  4 mg Oral Q6H PRN Michael Boston, MD       Or   ondansetron (ZOFRAN) injection 4 mg  4 mg Intravenous Q6H PRN Michael Boston, MD  phenol (CHLORASEPTIC) mouth spray 2 spray  2 spray Mouth/Throat PRN Michael Boston, MD       piperacillin-tazobactam (ZOSYN) IVPB 3.375 g  3.375 g Intravenous Tor Netters, MD 12.5 mL/hr at 07/25/22 1500 3.375 g at 07/25/22 1500   potassium chloride 10 mEq in 100 mL IVPB  10 mEq Intravenous Q1 Hr x 4 Michael Boston, MD 100 mL/hr at 07/25/22 1504 10 mEq at 07/25/22 1504   potassium chloride SA (KLOR-CON M) CR tablet 40 mEq  40 mEq Oral Daily Michael Boston, MD   40 mEq at 07/25/22 1258   prochlorperazine (COMPAZINE) tablet 10 mg  10 mg Oral Q6H PRN Michael Boston, MD       Or   prochlorperazine (COMPAZINE) injection 5-10 mg  5-10 mg Intravenous Q6H PRN Michael Boston, MD       simethicone (MYLICON) chewable tablet 40 mg  40 mg Oral Q6H PRN Michael Boston, MD       sodium chloride (PF) 0.9 % injection            sodium chloride (PF) 0.9 % injection              No Known Allergies  BP (!) 162/103 (BP Location: Left Arm)   Pulse (!) 101   Temp 97.8 F (36.6 C) (Oral)   Resp 17   SpO2 98%   CT ABDOMEN PELVIS W CONTRAST  Result Date: 07/25/2022 CLINICAL DATA:  Post extended LEFT hemicolectomy on 07/18/2012, postop day 7, gas at colorectal anastomosis question delayed leak EXAM: CT ABDOMEN AND PELVIS WITH CONTRAST TECHNIQUE: Multidetector CT imaging of the abdomen and pelvis was performed using the standard protocol following bolus administration of intravenous contrast. RADIATION DOSE REDUCTION: This exam was performed according to the departmental  dose-optimization program which includes automated exposure control, adjustment of the mA and/or kV according to patient size and/or use of iterative reconstruction technique. CONTRAST:  172m OMNIPAQUE IOHEXOL 300 MG/ML SOLN IV. Rectal contrast. COMPARISON:  Earlier study of 07/25/2022 FINDINGS: Lower chest: Bibasilar atelectasis Hepatobiliary: Gallbladder and liver normal appearance Pancreas: Normal appearance Spleen: Normal appearance Adrenals/Urinary Tract: Tiny cyst inferior pole RIGHT kidney again seen, 10 mm diameter; no follow-up imaging recommended. Adrenal glands, kidneys, ureters, and bladder otherwise normal appearance Stomach/Bowel: Post LEFT hemicolectomy with sigmoid anastomosis. Mild colonic wall thickening at anastomosis. Small extraluminal gas collection seen adjacent to the anastomosis on the previous exam is smaller in size and now contains a small amount of contrast consistent with anastomotic leak. Stomach and remaining bowel loops normal appearance. Vascular/Lymphatic: Vascular structures patent.  No adenopathy. Reproductive: Unremarkable prostate gland and seminal vesicles Other: Persistent significant edema and extraluminal gas adjacent to sigmoid colon. Significant free air. Expected postsurgical changes along LEFT colon bed at site of resection. No ascites. No hernia. Musculoskeletal: Unremarkable IMPRESSION: Post LEFT hemicolectomy with sigmoid anastomosis. Small extraluminal gas collection seen adjacent to the anastomosis on the previous exam is smaller in size and now contains a small amount of contrast consistent with anastomotic leak. Persistent significant edema and extraluminal gas adjacent to the sigmoid colon as well as persistent free air. Electronically Signed   By: MLavonia DanaM.D.   On: 07/25/2022 10:52   CT ABDOMEN PELVIS W CONTRAST  Result Date: 07/25/2022 CLINICAL DATA:  47year old male with abdominal pain. History of GI bleeding requiring abdominal surgery.  Postoperative day 7 status post extended left hemicolectomy, repair of anastomosis, and en bloc resection of colorectal anastomosis and omentum. Hospital discharge 3 days ago.  EXAM: CT ABDOMEN AND PELVIS WITH CONTRAST TECHNIQUE: Multidetector CT imaging of the abdomen and pelvis was performed using the standard protocol following bolus administration of intravenous contrast. RADIATION DOSE REDUCTION: This exam was performed according to the departmental dose-optimization program which includes automated exposure control, adjustment of the mA and/or kV according to patient size and/or use of iterative reconstruction technique. CONTRAST:  17m OMNIPAQUE IOHEXOL 300 MG/ML  SOLN COMPARISON:  CTA abdomen and pelvis 06/10/2022. FINDINGS: Lower chest: Platelike bilateral lung base atelectasis. No pericardial or pleural effusion. Hepatobiliary: Pneumoperitoneum scattered along the liver margins. Otherwise negative liver and gallbladder. Pancreas: Negative. Spleen: Negative aside from adjacent pneumoperitoneum. Adrenals/Urinary Tract: Normal adrenal glands. Nonobstructed kidneys. Symmetric renal enhancement and contrast excretion on the delayed images. No hydronephrosis, and proximal ureters are normal. The left ureter passes through an area of mesenteric stranding along the distal colon but remains nonenlarged. Normal left ureterovesical junction. Unremarkable bladder. Stomach/Bowel: Mild to moderate stool ball in the rectum. Pelvic inlet large bowel anastomosis series 2, image 70, with adjacent elongated 3-4 cm area of extraluminal gas in the mesentery series 2, image 68. Regional mesenteric edema and/or inflammation tracking along that loop. Upstream of the anastomosis the large bowel is mostly decompressed, with some feces. Transverse colon is more gas distended. And there are numerous small foci of pneumoperitoneum in the bilateral upper abdomen along the greater omentum and underlying the diaphragm. Retained stool in  the right colon to the hepatic flexure. Appendix appears normal on series 2, image 61. Negative terminal ileum. Gas and fluid containing small bowel loops are mostly decompressed in the abdomen. The stomach and duodenum are also mostly decompressed. Broad-based 7 cm area of mild to moderate mesenteric inflammation also in the left upper quadrant, former site of the splenic flexure. There is no organized or drainable fluid collection identified in the abdomen. Postoperative changes to the ventral abdominal wall, including a small volume of subcutaneous gas in the right lower abdomen series 2, image 84. Bilateral flank edema. No abdominal wall fluid collection. Vascular/Lymphatic: Major arterial structures and the portal venous system in the abdomen and pelvis appear patent. Normal caliber abdominal aorta. Mild iliofemoral atherosclerosis. No lymphadenopathy identified. Reproductive: Negative. Other: Trace presacral fluid or edema. Musculoskeletal: No acute or suspicious osseous lesion. IMPRESSION: 1. Pneumoperitoneum, greater than expected for postoperative day 7, and with a suspicious 3-4 cm focus of gas within the mesentery adjacent to the distal large bowel anastomosis. Consider anastomotic leak, see coronal images 51 through 54. Recommend Surgery consultation. 2. Edema and/or inflammation in the mesentery at the splenic flexure and along the distal colon anastomosis in #1. No organized or drainable fluid collection in the abdomen. No evidence of bowel obstruction. 3. Mild lung base atelectasis. Electronically Signed   By: HGenevie AnnM.D.   On: 07/25/2022 05:52   DG C-Arm 1-60 Min-No Report  Result Date: 07/18/2022 Fluoroscopy was utilized by the requesting physician.  No radiographic interpretation.

## 2022-07-26 LAB — CBC
HCT: 30.6 % — ABNORMAL LOW (ref 39.0–52.0)
Hemoglobin: 9.5 g/dL — ABNORMAL LOW (ref 13.0–17.0)
MCH: 26.5 pg (ref 26.0–34.0)
MCHC: 31 g/dL (ref 30.0–36.0)
MCV: 85.2 fL (ref 80.0–100.0)
Platelets: 462 10*3/uL — ABNORMAL HIGH (ref 150–400)
RBC: 3.59 MIL/uL — ABNORMAL LOW (ref 4.22–5.81)
RDW: 16.6 % — ABNORMAL HIGH (ref 11.5–15.5)
WBC: 11.9 10*3/uL — ABNORMAL HIGH (ref 4.0–10.5)
nRBC: 0.2 % (ref 0.0–0.2)

## 2022-07-26 LAB — POTASSIUM: Potassium: 3.3 mmol/L — ABNORMAL LOW (ref 3.5–5.1)

## 2022-07-26 MED ORDER — AMOXICILLIN-POT CLAVULANATE 875-125 MG PO TABS: 1.0000 | ORAL_TABLET | Freq: Two times a day (BID) | ORAL | 1 refills | Status: DC

## 2022-07-26 NOTE — TOC CM/SW Note (Signed)
Transition of Care [facility]) Screening Note  Patient Details  Name: Neil Mcintyre Date of Birth: Mar 14, 1976  Transition of [facility]) CM/SW Contact:    Sherie Don, LCSW Phone Number: 07/26/2022, 10:35 AM  Transition of Care Department [facility]) has reviewed patient and no TOC needs have been identified at this time. We will continue to monitor patient advancement through interdisciplinary progression rounds. If new patient transition needs arise, please place a TOC consult.

## 2022-07-26 NOTE — Progress Notes (Signed)
Neil Mcintyre [id number] Dec 10, 1975  CARE TEAM:  PCP: Ria Bush, MD  Outpatient Care Team: Patient Care Team: Ria Bush, MD as PCP - General (Family Medicine) Michael Boston, MD as Consulting Physician (General Surgery) Annamaria Helling, DO as Consulting Physician (Gastroenterology)  Inpatient Treatment Team: Treatment Team: Attending Provider: Michael Boston, MD; Consulting Physician: Michael Boston, MD; Technician: Rutherford Nail, NT; Registered Nurse: Darci Current, RN; Charge Nurse: Charlyne Petrin, RN; Pharmacist: Lynelle Doctor, [facility]   Problem List:   Principal Problem:   Diverticulitis Active Problems:   Anxiety   Obesity, morbid, BMI 40.0-49.9 ([place])   Diverticulitis of colon with bleeding      07/18/2022         POST-OPERATIVE DIAGNOSIS:   RECURRENT COLON BLEEDING AT SPLENIC FLEXURE INCARCERATED INCISIONAL HERNIA (4X3CM)   PROCEDURE:   -EXTENDED LEFT HEMICOLECTOMY -MOBILIZATION OF SPLENIC FLEXURE OF COLON -EN BLOC RESECTION OF COLORECTAL ANASTOMOSIS -OMENTECTOMY -REDUCTION & PRIMARY REPAIR OF INCISIONAL ANASTOMOSIS -INTRAOPERATIVE ASSESSMENT OF TISSUE VASCULAR PERFUSION USING ICG (indocyanine green) IMMUNOFLUORESCENCE -TRANSVERSUS ABDOMINIS PLANE (TAP) BLOCK - BILATERAL -RIGID PROCTOSCOPY   SURGEON:  Adin Hector, MD   OR FINDINGS:    Patient had moderate adhesions.  Large swath of greater omentum incarcerated in left lower quadrant paramedian incision from prior left colectomy.  Omentum reduced removed and primary repair done.   Thick inflamed irritated colon with large swath of dense adhesions at the splenic flexure and omentum.  Prior colorectal anastomosis noted at the rectosigmoid junction.  Resection done to the proximal mid transverse colon using a dominant right middle colic artery pedicle.   No obvious metastatic disease on visceral parietal peritoneum or liver.   It is a 67m EEA anastomosis (proximal mid  transverse colon connected to proximal rectum.)  It rests 15 cm from the anal verge by rigid proctoscopy.          Assessment  Clinically stable arguing against leak.  [facility]  Plan:  -Patient has no clinical deterioration concerning for leak.  Full liquid/dysphagia 1 diet.  Advance to soft diet.  If does well, can go home tomorrow.  Will do Augmentin as a hedge just in case with low threshold to call again.  If patient feeling worse, make n.p.o. with possible NG tube and continue IV antibiotics with repeat CAT scan to rule out worsening evidence of leak or collapse.  Hopefully not too likely.  Hypertension control.  VTE prophylaxis- SCDs, etc  mobilize as tolerated to help recovery  Disposition:  Disposition:  The patient is from: Home  Anticipate discharge to:  Home  Anticipated Date of Discharge is:  February 24,2024    Barriers to discharge:  Pending Clinical improvement (more likely than not)  Patient currently is NOT MEDICALLY STABLE for discharge from the hospital from a surgery standpoint.      I reviewed nursing notes, last 24 h vitals and pain scores, last 48 h intake and output, last 24 h labs and trends, and last 24 h imaging results. I have reviewed this patient's available data, including medical history, events of note, test results, etc as part of my evaluation.  A significant portion of that time was spent in counseling.  Care during the described time interval was provided by me.  This care required moderate level of medical decision making.  07/26/2022    Subjective: (Chief complaint)  Patient denies any pain.  Had a few loose bowel movements but nothing too severe.  No nausea or vomiting.  Hungry.  Objective:  Vital signs:  Vitals:   07/25/22 1643 07/25/22 2051 07/26/22 0136 07/26/22 0548  BP: (!) 153/94 (!) 152/87 (!) 157/89 (!) 141/99  Pulse: 92 92 97 92  Resp: '17 16 17 16  '$ Temp: 98.1 F (36.7 C) 97.8 F  (36.6 C) 97.8 F (36.6 C) (!) 97.3 F (36.3 C)  TempSrc: Oral Oral Oral Oral  SpO2: 98% 100% 96% 93%    Last BM Date : 07/25/22  Intake/Output   Yesterday:  02/22 0701 - 02/23 0700 In: 989.7 [I.V.:530.9; IV Piggyback:458.8] Out: 700 [Urine:700] This shift:  No intake/output data recorded.  Bowel function:  Flatus: YES  BM:  YES  Drain: (No drain)   Physical Exam:  General: Pt awake/alert in no acute distress Eyes: PERRL, normal EOM.  Sclera clear.  No icterus Neuro: CN II-XII intact w/o focal sensory/motor deficits. Lymph: No head/neck/groin lymphadenopathy Psych:  No delerium/psychosis/paranoia.  Oriented x 4 HENT: Normocephalic, Mucus membranes moist.  No thrush Neck: Supple, No tracheal deviation.  No obvious thyromegaly Chest: No pain to chest wall compression.  Good respiratory excursion.  No audible wheezing CV:  Pulses intact.  Regular rhythm.  No major extremity edema MS: Normal AROM mjr joints.  No obvious deformity  Abdomen: Soft.  Nondistended.  Mildly tender at incisions only.  No evidence of peritonitis.  No incarcerated hernias.  Ext:   No deformity.  No mjr edema.  No cyanosis Skin: No petechiae / purpurea.  No major sores.  Warm and dry    Results:   Cultures: No results found for this or any previous visit (from the past 720 hour(s)).  Labs: Results for orders placed or performed during the hospital encounter of 07/25/22 (from the past 48 hour(s))  CBC with Differential     Status: Abnormal   Collection Time: 07/25/22  4:48 AM  Result Value Ref Range   WBC 12.8 (H) 4.0 - 10.5 K/uL   RBC 3.88 (L) 4.22 - 5.81 MIL/uL   Hemoglobin 10.4 (L) 13.0 - 17.0 g/dL   HCT 32.6 (L) 39.0 - 52.0 %   MCV 84.0 80.0 - 100.0 fL   MCH 26.8 26.0 - 34.0 pg   MCHC 31.9 30.0 - 36.0 g/dL   RDW 16.6 (H) 11.5 - 15.5 %   Platelets 529 (H) 150 - 400 K/uL   nRBC 0.2 0.0 - 0.2 %   Neutrophils Relative % 67 %   Neutro Abs 8.6 (H) 1.7 - 7.7 K/uL   Lymphocytes Relative  9 %   Lymphs Abs 1.2 0.7 - 4.0 K/uL   Monocytes Relative 16 %   Monocytes Absolute 2.0 (H) 0.1 - 1.0 K/uL   Eosinophils Relative 4 %   Eosinophils Absolute 0.5 0.0 - 0.5 K/uL   Basophils Relative 0 %   Basophils Absolute 0.1 0.0 - 0.1 K/uL   Immature Granulocytes 4 %   Abs Immature Granulocytes 0.49 (H) 0.00 - 0.07 K/uL    Comment: Performed at [facility], [place] [address] [place], [place] [id number]  Basic metabolic panel     Status: Abnormal   Collection Time: 07/25/22  4:48 AM  Result Value Ref Range   Sodium 138 135 - 145 mmol/L   Potassium 3.0 (L) 3.5 - 5.1 mmol/L   Chloride 98 98 - 111 mmol/L   CO2 28 22 - 32 mmol/L   Glucose, Bld 112 (H) 70 - 99 mg/dL    Comment: Glucose reference  range applies only to samples taken after fasting for at least 8 hours.   BUN 12 6 - 20 mg/dL   Creatinine, Ser 0.78 0.61 - 1.24 mg/dL   Calcium 9.0 8.9 - 10.3 mg/dL   GFR, Estimated >60 >60 mL/min    Comment: (NOTE) Calculated using the CKD-EPI Creatinine Equation (2021)    Anion gap 12 5 - 15    Comment: Performed at [facility], [place] [address], [place], [place] [address]  Protime-INR     Status: None   Collection Time: 07/25/22  4:48 AM  Result Value Ref Range   Prothrombin Time 13.9 11.4 - 15.2 seconds   INR 1.1 0.8 - 1.2    Comment: (NOTE) INR goal varies based on device and disease states. Performed at [facility], [place] [address], [place], [place] [address]   Urinalysis, Routine w reflex microscopic -Urine, Clean Catch     Status: Abnormal   Collection Time: 07/25/22  6:55 AM  Result Value Ref Range   Color, Urine YELLOW YELLOW   APPearance CLEAR CLEAR   Specific Gravity, Urine 1.005 1.005 - 1.030   pH 5.0 5.0 - 8.0   Glucose, UA NEGATIVE NEGATIVE mg/dL   Hgb urine dipstick NEGATIVE NEGATIVE   Bilirubin Urine NEGATIVE NEGATIVE   Ketones, ur 20 (A) NEGATIVE mg/dL   Protein, ur NEGATIVE NEGATIVE mg/dL   Nitrite NEGATIVE  NEGATIVE   Leukocytes,Ua NEGATIVE NEGATIVE    Comment: Performed at [place] [address], [place], [place] [address]  Magnesium     Status: None   Collection Time: 07/25/22 11:40 AM  Result Value Ref Range   Magnesium 1.9 1.7 - 2.4 mg/dL    Comment: Performed at [facility], [place] [address], [place], [place] [address]  CBC     Status: Abnormal   Collection Time: 07/26/22  4:20 AM  Result Value Ref Range   WBC 11.9 (H) 4.0 - 10.5 K/uL   RBC 3.59 (L) 4.22 - 5.81 MIL/uL   Hemoglobin 9.5 (L) 13.0 - 17.0 g/dL   HCT 30.6 (L) 39.0 - 52.0 %   MCV 85.2 80.0 - 100.0 fL   MCH 26.5 26.0 - 34.0 pg   MCHC 31.0 30.0 - 36.0 g/dL   RDW 16.6 (H) 11.5 - 15.5 %   Platelets 462 (H) 150 - 400 K/uL   nRBC 0.2 0.0 - 0.2 %    Comment: Performed at [facility], [place] [address], [place], [place] [address]  Potassium     Status: Abnormal   Collection Time: 07/26/22  4:20 AM  Result Value Ref Range   Potassium 3.3 (L) 3.5 - 5.1 mmol/L    Comment: Performed at [facility], [place] [address] [place], [place] [address]    Imaging / Studies: CT ABDOMEN PELVIS W CONTRAST  Result Date: 07/25/2022 CLINICAL DATA:  Post extended LEFT hemicolectomy on 07/18/2012, postop day 7, gas at colorectal anastomosis question delayed leak EXAM: CT ABDOMEN AND PELVIS WITH CONTRAST TECHNIQUE: Multidetector CT imaging of the abdomen and pelvis was performed using the standard protocol following bolus administration of intravenous contrast. RADIATION DOSE REDUCTION: This exam was performed according to the departmental dose-optimization program which includes automated exposure control, adjustment of the mA and/or kV according to patient size and/or use of iterative reconstruction technique. CONTRAST:  131m OMNIPAQUE IOHEXOL 300 MG/ML SOLN IV. Rectal contrast. COMPARISON:  Earlier study of 07/25/2022 FINDINGS: Lower chest: Bibasilar atelectasis  Hepatobiliary: Gallbladder and liver normal appearance Pancreas: Normal appearance Spleen: Normal appearance Adrenals/Urinary Tract: Tiny cyst inferior pole RIGHT kidney again seen, 10 mm diameter; no follow-up imaging recommended. Adrenal glands, kidneys, ureters, and bladder otherwise normal appearance Stomach/Bowel: Post LEFT hemicolectomy with sigmoid anastomosis. Mild colonic wall thickening at anastomosis. Small extraluminal gas collection seen adjacent to the anastomosis on the previous exam is smaller in size and now contains a small amount of contrast consistent with anastomotic leak. Stomach and remaining bowel loops normal appearance. Vascular/Lymphatic: Vascular structures patent.  No adenopathy. Reproductive: Unremarkable prostate gland and seminal vesicles Other: Persistent significant edema and extraluminal gas adjacent to sigmoid colon. Significant free air. Expected postsurgical changes along LEFT colon bed at site of resection. No ascites. No hernia. Musculoskeletal: Unremarkable IMPRESSION: Post LEFT hemicolectomy with sigmoid anastomosis. Small extraluminal gas collection seen adjacent to the anastomosis on the previous exam is smaller in size and now contains a small amount of contrast consistent with anastomotic leak. Persistent significant edema and extraluminal gas adjacent to the sigmoid colon as well as persistent free air. Electronically Signed   By: Lavonia Dana M.D.   On: 07/25/2022 10:52   CT ABDOMEN PELVIS W CONTRAST  Result Date: 07/25/2022 CLINICAL DATA:  47 year old male with abdominal pain. History of GI bleeding requiring abdominal surgery. Postoperative day 7 status post extended left hemicolectomy, repair of anastomosis, and en bloc resection of colorectal anastomosis and omentum. Hospital discharge 3 days ago. EXAM: CT ABDOMEN AND PELVIS WITH CONTRAST TECHNIQUE: Multidetector CT imaging of the abdomen and pelvis was performed using the standard protocol following bolus  administration of intravenous contrast. RADIATION DOSE REDUCTION: This exam was performed according to the departmental dose-optimization program which includes automated exposure control, adjustment of the mA and/or kV according to patient size and/or use of iterative reconstruction technique. CONTRAST:  157m OMNIPAQUE IOHEXOL 300 MG/ML  SOLN COMPARISON:  CTA abdomen and pelvis 06/10/2022. FINDINGS: Lower chest: Platelike bilateral lung base atelectasis. No pericardial or pleural effusion. Hepatobiliary: Pneumoperitoneum scattered along the liver margins. Otherwise negative liver and gallbladder. Pancreas: Negative. Spleen: Negative aside from adjacent pneumoperitoneum. Adrenals/Urinary Tract: Normal adrenal glands. Nonobstructed kidneys. Symmetric renal enhancement and contrast excretion on the delayed images. No hydronephrosis, and proximal ureters are normal. The left ureter passes through an area of mesenteric stranding along the distal colon but remains nonenlarged. Normal left ureterovesical junction. Unremarkable bladder. Stomach/Bowel: Mild to moderate stool ball in the rectum. Pelvic inlet large bowel anastomosis series 2, image 70, with adjacent elongated 3-4 cm area of extraluminal gas in the mesentery series 2, image 68. Regional mesenteric edema and/or inflammation tracking along that loop. Upstream of the anastomosis the large bowel is mostly decompressed, with some feces. Transverse colon is more gas distended. And there are numerous small foci of pneumoperitoneum in the bilateral upper abdomen along the greater omentum and underlying the diaphragm. Retained stool in the right colon to the hepatic flexure. Appendix appears normal on series 2, image 61. Negative terminal ileum. Gas and fluid containing small bowel loops are mostly decompressed in the abdomen. The stomach and duodenum are also mostly decompressed. Broad-based 7 cm area of mild to moderate mesenteric inflammation also in the left upper  quadrant, former site of the splenic flexure. There is no organized or drainable fluid collection identified in the abdomen. Postoperative changes to the ventral abdominal wall, including a small volume of subcutaneous gas in the right lower abdomen series 2, image 84. Bilateral flank edema. No abdominal wall fluid collection.  Vascular/Lymphatic: Major arterial structures and the portal venous system in the abdomen and pelvis appear patent. Normal caliber abdominal aorta. Mild iliofemoral atherosclerosis. No lymphadenopathy identified. Reproductive: Negative. Other: Trace presacral fluid or edema. Musculoskeletal: No acute or suspicious osseous lesion. IMPRESSION: 1. Pneumoperitoneum, greater than expected for postoperative day 7, and with a suspicious 3-4 cm focus of gas within the mesentery adjacent to the distal large bowel anastomosis. Consider anastomotic leak, see coronal images 51 through 54. Recommend Surgery consultation. 2. Edema and/or inflammation in the mesentery at the splenic flexure and along the distal colon anastomosis in #1. No organized or drainable fluid collection in the abdomen. No evidence of bowel obstruction. 3. Mild lung base atelectasis. Electronically Signed   By: Genevie Ann M.D.   On: 07/25/2022 05:52    Medications / Allergies: per chart  Antibiotics: Anti-infectives (From admission, onward)    Start     Dose/Rate Route Frequency Ordered Stop   07/25/22 0800  piperacillin-tazobactam (ZOSYN) IVPB 3.375 g        3.375 g 12.5 mL/hr over 240 Minutes Intravenous Every 8 hours 07/25/22 0726 07/30/22 0759         Note: Portions of this report may have been transcribed using voice recognition software. Every effort was made to ensure accuracy; however, inadvertent computerized transcription errors may be present.   Any transcriptional errors that result from this process are unintentional.    Adin Hector, MD, FACS, MASCRS Esophageal, Gastrointestinal & Colorectal  Surgery Robotic and Minimally Invasive Surgery  [facility]. [address], [place], [place] [address] [phone] Fax [phone] Main  CONTACT INFORMATION:  Weekday (9AM-5PM): Call [facility] main office at [phone]  Weeknight (5PM-9AM) or Weekend/Holiday: Check [email or web address] (password " [email or web address]") for General Surgery [facility] coverage  (Please, do not use SecureChat as it is not reliable communication to reach operating surgeons for immediate patient care given surgeries/outpatient duties/clinic/cross-coverage/off post-call which would lead to a delay in care.  Epic staff messaging available for outptient concerns, but may not be answered for 48 hours or more).     07/26/2022  7:41 AM

## 2022-07-26 NOTE — Discharge Instructions (Signed)
SURGERY: POST OP INSTRUCTIONS (Surgery for small bowel obstruction, colon resection, etc)   ######################################################################  EAT Gradually transition to a high fiber diet with a fiber supplement over the next few days after discharge  WALK Walk an hour a day.  Control your pain to do that.    CONTROL PAIN Control pain so that you can walk, sleep, tolerate sneezing/coughing, go up/down stairs.  HAVE A BOWEL MOVEMENT DAILY Keep your bowels regular to avoid problems.  OK to try a laxative to override constipation.  OK to use an antidairrheal to slow down diarrhea.  Call if not better after 2 tries  CALL IF YOU HAVE PROBLEMS/CONCERNS Call if you are still struggling despite following these instructions. Call if you have concerns not answered by these instructions  ######################################################################   DIET Follow a light diet the first few days at home.  Start with a bland diet such as soups, liquids, starchy foods, low fat foods, etc.  If you feel full, bloated, or constipated, stay on a ful liquid or pureed/blenderized diet for a few days until you feel better and no longer constipated. Be sure to drink plenty of fluids every day to avoid getting dehydrated (feeling dizzy, not urinating, etc.). Gradually add a fiber supplement to your diet over the next week.  Gradually get back to a regular solid diet.  Avoid fast food or heavy meals the first week as you are more likely to get nauseated. It is expected for your digestive tract to need a few months to get back to normal.  It is common for your bowel movements and stools to be irregular.  You will have occasional bloating and cramping that should eventually fade away.  Until you are eating solid food normally, off all pain medications, and back to regular activities; your bowels will not be normal. Focus on eating a low-fat, high fiber diet the rest of your life  (See Getting to [place], below).  CARE of your INCISION or WOUND  It is good for closed incisions and even open wounds to be washed every day.  Shower every day.  Short baths are fine.  Wash the incisions and wounds clean with soap & water.    You may leave closed incisions open to air if it is dry.   You may cover the incision with clean gauze & replace it after your daily shower for comfort.    ACTIVITIES as tolerated Start light daily activities --- self-care, walking, climbing stairs-- beginning the day after surgery.  Gradually increase activities as tolerated.  Control your pain to be active.  Stop when you are tired.  Ideally, walk several times a day, eventually an hour a day.   Most people are back to most day-to-day activities in a few weeks.  It takes 4-8 weeks to get back to unrestricted, intense activity. If you can walk 30 minutes without difficulty, it is safe to try more intense activity such as jogging, treadmill, bicycling, low-impact aerobics, swimming, etc. Save the most intensive and strenuous activity for last (Usually 4-8 weeks after surgery) such as sit-ups, heavy lifting, contact sports, etc.  Refrain from any intense heavy lifting or straining until you are off narcotics for pain control.  You will have off days, but things should improve week-by-week. DO NOT PUSH THROUGH PAIN.  Let pain be your guide: If it hurts to do something, don't do it.  Pain is your body warning you to avoid that activity for another week until  the pain goes down. You may drive when you are no longer taking narcotic prescription pain medication, you can comfortably wear a seatbelt, and you can safely make sudden turns/stops to protect yourself without hesitating due to pain. You may have sexual intercourse when it is comfortable. If it hurts to do something, stop.  MEDICATIONS Take your usually prescribed home medications unless otherwise directed.   Blood thinners:  Usually you can  restart any strong blood thinners after the second postoperative day.  It is OK to take aspirin right away.     If you are on strong blood thinners (warfarin/Coumadin, Plavix, Xerelto, Eliquis, Pradaxa, etc), discuss with your surgeon, medicine PCP, and/or cardiologist for instructions on when to restart the blood thinner & if blood monitoring is needed (PT/INR blood check, etc).     PAIN CONTROL Pain after surgery or related to activity is often due to strain/injury to muscle, tendon, nerves and/or incisions.  This pain is usually short-term and will improve in a few months.  To help speed the process of healing and to get back to regular activity more quickly, DO THE FOLLOWING THINGS TOGETHER: Increase activity gradually.  DO NOT PUSH THROUGH PAIN Use Ice and/or Heat Try Gentle Massage and/or Stretching Take over the counter pain medication Take Narcotic prescription pain medication for more severe pain  Good pain control = faster recovery.  It is better to take more medicine to be more active than to stay in bed all day to avoid medications.  Increase activity gradually Avoid heavy lifting at first, then increase to lifting as tolerated over the next 6 weeks. Do not "push through" the pain.  Listen to your body and avoid positions and maneuvers than reproduce the pain.  Wait a few days before trying something more intense Walking an hour a day is encouraged to help your body recover faster and more safely.  Start slowly and stop when getting sore.  If you can walk 30 minutes without stopping or pain, you can try more intense activity (running, jogging, aerobics, cycling, swimming, treadmill, sex, sports, weightlifting, etc.) Remember: If it hurts to do it, then don't do it! Use Ice and/or Heat You will have swelling and bruising around the incisions.  This will take several weeks to resolve. Ice packs or heating pads (6-8 times a day, 30-60 minutes at a time) will help sooth soreness &  bruising. Some people prefer to use ice alone, heat alone, or alternate between ice & heat.  Experiment and see what works best for you.  Consider trying ice for the first few days to help decrease swelling and bruising; then, switch to heat to help relax sore spots and speed recovery. Shower every day.  Short baths are fine.  It feels good!  Keep the incisions and wounds clean with soap & water.   Try Gentle Massage and/or Stretching Massage at the area of pain many times a day Stop if you feel pain - do not overdo it Take over the counter pain medication This helps the muscle and nerve tissues become less irritable and calm down faster Choose ONE of the following over-the-counter anti-inflammatory medications: Acetaminophen '500mg'$  tabs (Tylenol) 1-2 pills with every meal and just before bedtime (avoid if you have liver problems or if you have acetaminophen in you narcotic prescription) Naproxen '220mg'$  tabs (ex. Aleve, Naprosyn) 1-2 pills twice a day (avoid if you have kidney, stomach, IBD, or bleeding problems) Ibuprofen '200mg'$  tabs (ex. Advil, Motrin) 3-4 pills with every  meal and just before bedtime (avoid if you have kidney, stomach, IBD, or bleeding problems) Take with food/snack several times a day as directed for at least 2 weeks to help keep pain / soreness down & more manageable. Take Narcotic prescription pain medication for more severe pain A prescription for strong pain control is often given to you upon discharge (for example: oxycodone/Percocet, hydrocodone/Norco/Vicodin, or tramadol/Ultram) Take your pain medication as prescribed. Be mindful that most narcotic prescriptions contain Tylenol (acetaminophen) as well - avoid taking too much Tylenol. If you are having problems/concerns with the prescription medicine (does not control pain, nausea, vomiting, rash, itching, etc.), please call us [phone] to see if we need to switch you to a different pain medicine that will work better  for you and/or control your side effects better. If you need a refill on your pain medication, you must call the office before 4 pm and on weekdays only.  By federal law, prescriptions for narcotics cannot be called into a pharmacy.  They must be filled out on paper & picked up from our office by the patient or authorized caretaker.  Prescriptions cannot be filled after 4 pm nor on weekends.    WHEN TO CALL us [phone] Severe uncontrolled or worsening pain  Fever over 101 F (38.5 C) Concerns with the incision: Worsening pain, redness, rash/hives, swelling, bleeding, or drainage Reactions / problems with new medications (itching, rash, hives, nausea, etc.) Nausea and/or vomiting Difficulty urinating Difficulty breathing Worsening fatigue, dizziness, lightheadedness, blurred vision Other concerns If you are not getting better after two weeks or are noticing you are getting worse, contact our office [phone] for further advice.  We may need to adjust your medications, re-evaluate you in the office, send you to the emergency room, or see what other things we can do to help. The clinic staff is available to answer your questions during regular business hours (8:30am-5pm).  Please don't hesitate to call and ask to speak to one of our nurses for clinical concerns.    A surgeon from [facility] is always on call at the hospitals 24 hours/day If you have a medical emergency, go to the nearest emergency room or call 911.  FOLLOW UP in our office One the day of your discharge from the hospital (or the next business weekday), please call [facility] to set up or confirm an appointment to see your surgeon in the office for a follow-up appointment.  Usually it is 2-3 weeks after your surgery.   If you have skin staples at your incision(s), let the office know so we can set up a time in the office for the nurse to remove them (usually around 10 days after surgery). Make  sure that you call for appointments the day of discharge (or the next business weekday) from the hospital to ensure a convenient appointment time. IF YOU HAVE DISABILITY OR FAMILY LEAVE FORMS, BRING THEM TO THE OFFICE FOR PROCESSING.  DO NOT GIVE THEM TO YOUR DOCTOR.  [facility] [address] [place], [place],  [place]  [address] ? [phone] - Main [phone] - [place],  [phone] - Fax [email or web address]    GETTING TO GOOD BOWEL HEALTH. It is expected for your digestive tract to need a few months to get back to normal.  It is common for your bowel movements and stools to be irregular.  You will have occasional bloating and cramping that should eventually fade away.  Until you are eating solid food normally, off all pain medications, and back to regular activities; your bowels will not be normal.   Avoiding constipation The goal: ONE SOFT BOWEL MOVEMENT A DAY!    Drink plenty of fluids.  Choose water first. TAKE A FIBER SUPPLEMENT EVERY DAY THE REST OF YOUR LIFE During your first week back home, gradually add back a fiber supplement every day Experiment which form you can tolerate.   There are many forms such as powders, tablets, wafers, gummies, etc Psyllium bran (Metamucil), methylcellulose (Citrucel), Miralax or Glycolax, Benefiber, Flax Seed.  Adjust the dose week-by-week (1/2 dose/day to 6 doses a day) until you are moving your bowels 1-2 times a day.  Cut back the dose or try a different fiber product if it is giving you problems such as diarrhea or bloating. Sometimes a laxative is needed to help jump-start bowels if constipated until the fiber supplement can help regulate your bowels.  If you are tolerating eating & you are farting, it is okay to try a gentle laxative such as double dose MiraLax, prune juice, or Milk of Magnesia.  Avoid using laxatives too often. Stool softeners can sometimes help counteract the constipating effects  of narcotic pain medicines.  It can also cause diarrhea, so avoid using for too long. If you are still constipated despite taking fiber daily, eating solids, and a few doses of laxatives, call our office. Controlling diarrhea Try drinking liquids and eating bland foods for a few days to avoid stressing your intestines further. Avoid dairy products (especially milk & ice cream) for a short time.  The intestines often can lose the ability to digest lactose when stressed. Avoid foods that cause gassiness or bloating.  Typical foods include beans and other legumes, cabbage, broccoli, and dairy foods.  Avoid greasy, spicy, fast foods.  Every person has some sensitivity to other foods, so listen to your body and avoid those foods that trigger problems for you. Probiotics (such as active yogurt, Align, etc) may help repopulate the intestines and colon with normal bacteria and calm down a sensitive digestive tract Adding a fiber supplement gradually can help thicken stools by absorbing excess fluid and retrain the intestines to act more normally.  Slowly increase the dose over a few weeks.  Too much fiber too soon can backfire and cause cramping & bloating. It is okay to try and slow down diarrhea with a few doses of antidiarrheal medicines.   Bismuth subsalicylate (ex. Kayopectate, Pepto Bismol) for a few doses can help control diarrhea.  Avoid if pregnant.   Loperamide (Imodium) can slow down diarrhea.  Start with one tablet ('2mg'$ ) first.  Avoid if you are having fevers or severe pain.  ILEOSTOMY PATIENTS WILL HAVE CHRONIC DIARRHEA since their colon is not in use.    Drink plenty of liquids.  You will need to drink even more glasses of water/liquid a day to avoid getting dehydrated. Record output from your ileostomy.  Expect to empty the bag every 3-4 hours at first.  Most people with a permanent ileostomy empty their bag 4-6 times at the least.   Use antidiarrheal medicine (especially Imodium) several times  a day to avoid getting dehydrated.  Start with a dose at bedtime & breakfast.  Adjust up or down as needed.  Increase antidiarrheal medications as directed to avoid emptying the bag more than 8 times a day (every 3 hours). Work with your wound ostomy nurse to learn care for your ostomy.  See ostomy care instructions. TROUBLESHOOTING IRREGULAR BOWELS 1) Start with a soft & bland diet. No spicy, greasy, or fried foods.  2) Avoid gluten/wheat or dairy products from diet to see if symptoms improve. 3) Miralax 17gm or flax seed mixed in S[place]. water or juice-daily. May use 2-4 times a day as needed. 4) Gas-X, Phazyme, etc. as needed for gas & bloating.  5) Prilosec (omeprazole) over-the-counter as needed 6)  Consider probiotics (Align, Activa, etc) to help calm the bowels down  Call your doctor if you are getting worse or not getting better.  Sometimes further testing (cultures, endoscopy, X-ray studies, CT scans, bloodwork, etc.) may be needed to help diagnose and treat the cause of the diarrhea. [facility], [place], [place], [place], [place]  [id number] [phone] - Main.    [phone]  - Toll Free.   [phone] - Fax [email or web address]

## 2022-07-27 ENCOUNTER — Encounter (HOSPITAL_COMMUNITY): Payer: Self-pay | Admitting: Surgery

## 2022-07-27 LAB — CBC
HCT: 32.3 % — ABNORMAL LOW (ref 39.0–52.0)
Hemoglobin: 10.1 g/dL — ABNORMAL LOW (ref 13.0–17.0)
MCH: 26.4 pg (ref 26.0–34.0)
MCHC: 31.3 g/dL (ref 30.0–36.0)
MCV: 84.6 fL (ref 80.0–100.0)
Platelets: 541 10*3/uL — ABNORMAL HIGH (ref 150–400)
RBC: 3.82 MIL/uL — ABNORMAL LOW (ref 4.22–5.81)
RDW: 16.5 % — ABNORMAL HIGH (ref 11.5–15.5)
WBC: 13.8 10*3/uL — ABNORMAL HIGH (ref 4.0–10.5)
nRBC: 0.2 % (ref 0.0–0.2)

## 2022-07-27 NOTE — Progress Notes (Signed)
   Subjective/Chief Complaint: Still having lower abdominal tenderness but less Denies pain No bm yesterday but passing flatus   Objective: Vital signs in last 24 hours: Temp:  [97.5 F (36.4 C)-98.8 F (37.1 C)] 98.7 F (37.1 C) (02/24 0551) Pulse Rate:  [85-107] 85 (02/24 0551) Resp:  [16] 16 (02/24 0551) BP: (145-160)/(87-94) 145/93 (02/24 0551) SpO2:  [94 %-97 %] 95 % (02/24 0551) Last BM Date : 07/26/22  Intake/Output from previous day: 02/23 0701 - 02/24 0700 In: 1937.8 [P.O.:1209; I.V.:503.3; IV Piggyback:225.5] Out: -  Intake/Output this shift: No intake/output data recorded.  Exam: Awake and alert Abdomen is obese, soft with tenderness in the LLQ and suprapubic area  Lab Results:  Recent Labs    07/26/22 0420 07/27/22 0521  WBC 11.9* 13.8*  HGB 9.5* 10.1*  HCT 30.6* 32.3*  PLT 462* 541*   BMET Recent Labs    07/25/22 0448 07/26/22 0420  NA 138  --   K 3.0* 3.3*  CL 98  --   CO2 28  --   GLUCOSE 112*  --   BUN 12  --   CREATININE 0.78  --   CALCIUM 9.0  --    PT/INR Recent Labs    07/25/22 0448  LABPROT 13.9  INR 1.1   ABG No results for input(s): "PHART", "HCO3" in the last 72 hours.  Invalid input(s): "PCO2", "PO2"  Studies/Results: No results found.  Anti-infectives: Anti-infectives (From admission, onward)    Start     Dose/Rate Route Frequency Ordered Stop   07/26/22 0000  amoxicillin-clavulanate (AUGMENTIN) 875-125 MG tablet        1 tablet Oral 2 times daily 07/26/22 1223     07/25/22 0800  piperacillin-tazobactam (ZOSYN) IVPB 3.375 g        3.375 g 12.5 mL/hr over 240 Minutes Intravenous Every 8 hours 07/25/22 0726 07/30/22 0759       Assessment/Plan: S/p left hemicolectomy on 2/15 admitted with possible contained anastomotic leak  WBC still up Will continue IV antibiotics and repeat in the morning Not ready for discharge today    LOS: 2 days    Coralie Keens MD 07/27/2022

## 2022-07-28 ENCOUNTER — Other Ambulatory Visit: Payer: Self-pay | Admitting: Family Medicine

## 2022-07-28 ENCOUNTER — Inpatient Hospital Stay (HOSPITAL_COMMUNITY): Payer: 59

## 2022-07-28 DIAGNOSIS — F39 Unspecified mood [affective] disorder: Secondary | ICD-10-CM

## 2022-07-28 LAB — CBC
HCT: 33.4 % — ABNORMAL LOW (ref 39.0–52.0)
Hemoglobin: 10.4 g/dL — ABNORMAL LOW (ref 13.0–17.0)
MCH: 26.7 pg (ref 26.0–34.0)
MCHC: 31.1 g/dL (ref 30.0–36.0)
MCV: 85.9 fL (ref 80.0–100.0)
Platelets: 628 10*3/uL — ABNORMAL HIGH (ref 150–400)
RBC: 3.89 MIL/uL — ABNORMAL LOW (ref 4.22–5.81)
RDW: 17 % — ABNORMAL HIGH (ref 11.5–15.5)
WBC: 14.3 10*3/uL — ABNORMAL HIGH (ref 4.0–10.5)
nRBC: 0.3 % — ABNORMAL HIGH (ref 0.0–0.2)

## 2022-07-28 MED ORDER — METRONIDAZOLE 500 MG/100ML IV SOLN
500.0000 mg | Freq: Two times a day (BID) | INTRAVENOUS | Status: DC
  Administered 2022-07-28 – 2022-07-29 (×2): 500 mg via INTRAVENOUS
  Filled 2022-07-28 (×2): qty 100

## 2022-07-28 MED ORDER — IOHEXOL 300 MG/ML  SOLN
100.0000 mL | Freq: Once | INTRAMUSCULAR | Status: AC | PRN
  Administered 2022-07-28: 100 mL via INTRAVENOUS

## 2022-07-28 MED ORDER — IOHEXOL 9 MG/ML PO SOLN
500.0000 mL | ORAL | Status: AC
  Administered 2022-07-28 (×2): 500 mL via ORAL

## 2022-07-28 MED ORDER — CIPROFLOXACIN IN D5W 400 MG/200ML IV SOLN
400.0000 mg | Freq: Two times a day (BID) | INTRAVENOUS | Status: DC
  Administered 2022-07-28 – 2022-07-29 (×2): 400 mg via INTRAVENOUS
  Filled 2022-07-28 (×2): qty 200

## 2022-07-28 NOTE — Progress Notes (Signed)
   Subjective/Chief Complaint: He reports only very mild LLQ abdominal pain Continues to have BM's Tolerating PO   Objective: Vital signs in last 24 hours: Temp:  [97.7 F (36.5 C)-98.3 F (36.8 C)] 98.3 F (36.8 C) (02/25 0954) Pulse Rate:  [93-103] 103 (02/25 0954) Resp:  [16-18] 16 (02/25 0954) BP: (145-158)/(90-108) 145/90 (02/25 0954) SpO2:  [93 %-100 %] 93 % (02/25 0954) Last BM Date : 07/27/22  Intake/Output from previous day: 02/24 0701 - 02/25 0700 In: 1229.9 [P.O.:1080; IV Piggyback:149.9] Out: -  Intake/Output this shift: No intake/output data recorded.  Exam; Awake and alert Looks comfortable Abdomen soft, obese with LLQ tenderness and guarding  Lab Results:  Recent Labs    07/27/22 0521 07/28/22 0655  WBC 13.8* 14.3*  HGB 10.1* 10.4*  HCT 32.3* 33.4*  PLT 541* 628*   BMET Recent Labs    07/26/22 0420  K 3.3*   PT/INR No results for input(s): "LABPROT", "INR" in the last 72 hours. ABG No results for input(s): "PHART", "HCO3" in the last 72 hours.  Invalid input(s): "PCO2", "PO2"  Studies/Results: No results found.  Anti-infectives: Anti-infectives (From admission, onward)    Start     Dose/Rate Route Frequency Ordered Stop   07/28/22 1115  ciprofloxacin (CIPRO) IVPB 400 mg        400 mg 200 mL/hr over 60 Minutes Intravenous Every 12 hours 07/28/22 1018     07/28/22 1115  metroNIDAZOLE (FLAGYL) IVPB 500 mg        500 mg 100 mL/hr over 60 Minutes Intravenous Every 12 hours 07/28/22 1018     07/26/22 0000  amoxicillin-clavulanate (AUGMENTIN) 875-125 MG tablet        1 tablet Oral 2 times daily 07/26/22 1223     07/25/22 0800  piperacillin-tazobactam (ZOSYN) IVPB 3.375 g  Status:  Discontinued        3.375 g 12.5 mL/hr over 240 Minutes Intravenous Every 8 hours 07/25/22 0726 07/28/22 1018       Assessment/Plan: S/p left hemicolectomy on 2/15 admitted with possible anastomotic leak  WBC up further today Will change IV  antibiotics to Cipro and Flagyl Will repeat his CT scan to look for abscess, increased evidence of leak that may require intervention   LOS: 3 days    Coralie Keens MD 07/28/2022

## 2022-07-29 LAB — CBC
HCT: 33.6 % — ABNORMAL LOW (ref 39.0–52.0)
Hemoglobin: 10.2 g/dL — ABNORMAL LOW (ref 13.0–17.0)
MCH: 25.8 pg — ABNORMAL LOW (ref 26.0–34.0)
MCHC: 30.4 g/dL (ref 30.0–36.0)
MCV: 85.1 fL (ref 80.0–100.0)
Platelets: 580 10*3/uL — ABNORMAL HIGH (ref 150–400)
RBC: 3.95 MIL/uL — ABNORMAL LOW (ref 4.22–5.81)
RDW: 16.9 % — ABNORMAL HIGH (ref 11.5–15.5)
WBC: 14.2 10*3/uL — ABNORMAL HIGH (ref 4.0–10.5)
nRBC: 0.3 % — ABNORMAL HIGH (ref 0.0–0.2)

## 2022-07-29 NOTE — Discharge Summary (Signed)
Physician Discharge Summary    Patient ID: Neil Mcintyre MRN: [id number] DOB/AGE: 46-Dec-1977  46 y.o.  Patient Care Team: Ria Bush, MD as PCP - General (Family Medicine) Michael Boston, MD as Consulting Physician (General Surgery) Annamaria Helling, DO as Consulting Physician (Gastroenterology)  Admit date: 07/25/2022  Discharge date: 07/29/2022  Hospital Stay = 4 days    Discharge Diagnoses:  Principal Problem:   Diverticulitis Active Problems:   Anxiety   Primary hypertension   Obesity, morbid, BMI 40.0-49.9 ([place])   Type 2 diabetes mellitus with other specified complication ([place])   Dyslipidemia   Diverticulitis of colon with bleeding    07/18/2022         POST-OPERATIVE DIAGNOSIS:   RECURRENT COLON BLEEDING AT SPLENIC FLEXURE INCARCERATED INCISIONAL HERNIA (4X3CM)   PROCEDURE:   -EXTENDED LEFT HEMICOLECTOMY -MOBILIZATION OF SPLENIC FLEXURE OF COLON -EN BLOC RESECTION OF COLORECTAL ANASTOMOSIS -OMENTECTOMY -REDUCTION & PRIMARY REPAIR OF INCISIONAL ANASTOMOSIS -INTRAOPERATIVE ASSESSMENT OF TISSUE VASCULAR PERFUSION USING ICG (indocyanine green) IMMUNOFLUORESCENCE -TRANSVERSUS ABDOMINIS PLANE (TAP) BLOCK - BILATERAL -RIGID PROCTOSCOPY   SURGEON:  Adin Hector, MD   OR FINDINGS:    Patient had moderate adhesions.  Large swath of greater omentum incarcerated in left lower quadrant paramedian incision from prior left colectomy.  Omentum reduced removed and primary repair done.   Thick inflamed irritated colon with large swath of dense adhesions at the splenic flexure and omentum.  Prior colorectal anastomosis noted at the rectosigmoid junction.  Resection done to the proximal mid transverse colon using a dominant right middle colic artery pedicle.   No obvious metastatic disease on visceral parietal peritoneum or liver.   It is a 50m EEA anastomosis (proximal mid transverse colon connected to proximal rectum.)  It rests 15 cm from the anal  verge by rigid proctoscopy.    SURGEON:    * Surgery not found *  Consults: Case Management / Social Work  Hospital Course:   The patient underwent the surgery above.  Postoperatively, the patient gradually mobilized and advanced to a solid diet.  Pain and other symptoms were treated aggressively.  He went home.  He was worried about some ecchymosis so came to the ER Thursday night.  Mild soreness in incision.  CAT scan done raise concern of possible leak.  CT scan enema raised concern of possible small contained leak.  Clinically fine.  Patient was admitted for observation.  No decline with bowel rest.  Advanced on diet.  Some soreness at his incision only musculoskeletal.  No nausea or vomiting or loss of appetite or change in bowels.  Tolerating solid food.  Repeat scan done 3 days later showing resolution of some upper abdominal postoperative gas and some gas around his known anastomosis stable with less inflammation.  Abdominal wall gas resolved.  No fluid collection.  No obstruction or ileus.  Patient tolerating solid food without decline.  Soreness controlled with Robaxin only.  Only musculoskeletal with activity and not related to eating.  White count 13-14 without worsening progression.  No tachycardia.  No fevers.  No cellulitis or abscess.  Bowels mildly loose but not worse.  Based on the white count being stable with no clinical decline felt it was safe to transition home with close outpatient follow-up.  Plan discussed with patient and nursing.  All are in agreement.  Postoperative recommendations were discussed in detail.  They are written as well.  Discharged Condition: good  Discharge Exam: Blood pressure (!) 152/94, pulse 90, temperature 98.2  F (36.8 C), temperature source Oral, resp. rate 18, SpO2 95 %.  General: Pt awake/alert/oriented x4 in No acute distress Eyes: PERRL, normal EOM.  Sclera clear.  No icterus Neuro: CN II-XII intact w/o focal sensory/motor  deficits. Lymph: No head/neck/groin lymphadenopathy Psych:  No delerium/psychosis/paranoia HENT: Normocephalic, Mucus membranes moist.  No thrush Neck: Supple, No tracheal deviation Chest:  No chest wall pain w good excursion CV:  Pulses intact.  Regular rhythm MS: Normal AROM mjr joints.  No obvious deformity Abdomen: Obese with mild panniculus stable.  Minimal ecchymosis at this point.  Incisions clean dry intact with no cellulitis.  No all healing ridge.  No phlegmon or purulent drainage.  Soft.  Nondistended.  Nontender.  No evidence of peritonitis.  No incarcerated hernias. Ext:  SCDs BLE.  No mjr edema.  No cyanosis Skin: No petechiae / purpura   Disposition:    Follow-up [facility]. Call in 3 day(s).   Specialty: General Surgery Why: Call office Thursday to let us know how things are going.  Keep original appointment to see Dr Johney Maine on 08/07/2022 Contact information: [address] [place] [phone]                Discharge disposition: 01-Home or Self Care       Discharge Instructions     Call MD for:   Complete by: As directed    FEVER > 101.5 F  (temperatures < 101.5 F are not significant)   Call MD for:  extreme fatigue   Complete by: As directed    Call MD for:  persistant dizziness or light-headedness   Complete by: As directed    Call MD for:  persistant nausea and vomiting   Complete by: As directed    Call MD for:  redness, tenderness, or signs of infection (pain, swelling, redness, odor or green/yellow discharge around incision site)   Complete by: As directed    Call MD for:  severe uncontrolled pain   Complete by: As directed    Diet - low sodium heart healthy   Complete by: As directed    Start with a bland diet such as soups, liquids, starchy foods, low fat foods, etc. the first few days at home. Gradually advance to a solid, low-fat, high fiber diet by the  end of the first week at home.   Add a fiber supplement to your diet (Metamucil, etc) If you feel full, bloated, or constipated, stay on a full liquid or pureed/blenderized diet for a few days until you feel better and are no longer constipated.   Discharge instructions   Complete by: As directed    See Discharge Instructions If you are not getting better after two weeks or are noticing you are getting worse, contact our office [phone] for further advice.  We may need to adjust your medications, re-evaluate you in the office, send you to the emergency room, or see what other things we can do to help. The clinic staff is available to answer your questions during regular business hours (8:30am-5pm).  Please don't hesitate to call and ask to speak to one of our nurses for clinical concerns.    A surgeon from [facility] is always on call at the hospitals 24 hours/day If you have a medical emergency, go to the nearest emergency room or call 911.   Discharge wound care:   Complete by: As directed  It is good for closed incisions and even open wounds to be washed every day.  Shower every day.  Short baths are fine.  Wash the incisions and wounds clean with soap & water.    You may leave closed incisions open to air if it is dry.   You may cover the incision with clean gauze & replace it after your daily shower for comfort.   Driving Restrictions   Complete by: As directed    You may drive when: - you are no longer taking narcotic prescription pain medication - you can comfortably wear a seatbelt - you can safely make sudden turns/stops without pain.   Increase activity slowly   Complete by: As directed    Start light daily activities --- self-care, walking, climbing stairs- beginning the day after surgery.  Gradually increase activities as tolerated.  Control your pain to be active.  Stop when you are tired.  Ideally, walk several times a day, eventually an hour a day.   Most  people are back to most day-to-day activities in a few weeks.  It takes 4-6 weeks to get back to unrestricted, intense activity. If you can walk 30 minutes without difficulty, it is safe to try more intense activity such as jogging, treadmill, bicycling, low-impact aerobics, swimming, etc. Save the most intensive and strenuous activity for last (Usually 4-8 weeks after surgery) such as sit-ups, heavy lifting, contact sports, etc.  Refrain from any intense heavy lifting or straining until you are off narcotics for pain control.  You will have off days, but things should improve week-by-week. DO NOT PUSH THROUGH PAIN.  Let pain be your guide: If it hurts to do something, don't do it.   Lifting restrictions   Complete by: As directed    If you can walk 30 minutes without difficulty, it is safe to try more intense activity such as jogging, treadmill, bicycling, low-impact aerobics, swimming, etc. Save the most intensive and strenuous activity for last (Usually 4-8 weeks after surgery) such as sit-ups, heavy lifting, contact sports, etc.   Refrain from any intense heavy lifting or straining until you are off narcotics for pain control.  You will have off days, but things should improve week-by-week. DO NOT PUSH THROUGH PAIN.  Let pain be your guide: If it hurts to do something, don't do it.  Pain is your body warning you to avoid that activity for another week until the pain goes down.   May shower / Bathe   Complete by: As directed    May walk up steps   Complete by: As directed    Sexual Activity Restrictions   Complete by: As directed    You may have sexual intercourse when it is comfortable. If it hurts to do something, stop.       Allergies as of 07/29/2022   No Known Allergies      Medication List     TAKE these medications    acetaminophen 500 MG tablet Commonly known as: TYLENOL Take 1,000 mg by mouth every 6 (six) hours as needed for mild pain.   amLODipine 5 MG tablet Commonly  known as: NORVASC Take 1 tablet (5 mg total) by mouth daily.   amoxicillin-clavulanate 875-125 MG tablet Commonly known as: AUGMENTIN Take 1 tablet by mouth 2 (two) times daily.   buPROPion 150 MG 24 hr tablet Commonly known as: WELLBUTRIN XL Take 1 tablet (150 mg total) by mouth daily.   cyanocobalamin 500 MCG tablet Commonly known  as: VITAMIN B12 Take 1 tablet (500 mcg total) by mouth daily.   ferrous sulfate 325 (65 FE) MG tablet Take 1 tablet (325 mg total) by mouth daily.   methocarbamol 500 MG tablet Commonly known as: ROBAXIN Take 1 tablet (500 mg) by mouth every 8 hours as needed for muscle spasms.   metoprolol succinate 25 MG 24 hr tablet Commonly known as: TOPROL-XL Take 1 tablet (25 mg total) by mouth daily.   oxyCODONE 5 MG immediate release tablet Commonly known as: Oxy IR/ROXICODONE Take 1 - 2 tablets (5 - 10 mg total) by mouth every 6 hours as needed for breakthrough pain or severe pain.   valACYclovir 500 MG tablet Commonly known as: VALTREX Take 1 tablet (500 mg total) by mouth daily.               Discharge Care Instructions  (From admission, onward)           Start     Ordered   07/26/22 0000  Discharge wound care:       Comments: It is good for closed incisions and even open wounds to be washed every day.  Shower every day.  Short baths are fine.  Wash the incisions and wounds clean with soap & water.    You may leave closed incisions open to air if it is dry.   You may cover the incision with clean gauze & replace it after your daily shower for comfort.   07/26/22 1223            Significant Diagnostic Studies:  Results for orders placed or performed during the hospital encounter of 07/25/22 (from the past 72 hour(s))  CBC     Status: Abnormal   Collection Time: 07/27/22  5:21 AM  Result Value Ref Range   WBC 13.8 (H) 4.0 - 10.5 K/uL   RBC 3.82 (L) 4.22 - 5.81 MIL/uL   Hemoglobin 10.1 (L) 13.0 - 17.0 g/dL   HCT 32.3 (L) 39.0 -  52.0 %   MCV 84.6 80.0 - 100.0 fL   MCH 26.4 26.0 - 34.0 pg   MCHC 31.3 30.0 - 36.0 g/dL   RDW 16.5 (H) 11.5 - 15.5 %   Platelets 541 (H) 150 - 400 K/uL   nRBC 0.2 0.0 - 0.2 %    Comment: Performed at [facility], [place] [address] [place], [place] [address]  CBC     Status: Abnormal   Collection Time: 07/28/22  6:55 AM  Result Value Ref Range   WBC 14.3 (H) 4.0 - 10.5 K/uL   RBC 3.89 (L) 4.22 - 5.81 MIL/uL   Hemoglobin 10.4 (L) 13.0 - 17.0 g/dL   HCT 33.4 (L) 39.0 - 52.0 %   MCV 85.9 80.0 - 100.0 fL   MCH 26.7 26.0 - 34.0 pg   MCHC 31.1 30.0 - 36.0 g/dL   RDW 17.0 (H) 11.5 - 15.5 %   Platelets 628 (H) 150 - 400 K/uL   nRBC 0.3 (H) 0.0 - 0.2 %    Comment: Performed at [facility], [place] [address] [place], [place] [address]  CBC     Status: Abnormal   Collection Time: 07/29/22  4:00 AM  Result Value Ref Range   WBC 14.2 (H) 4.0 - 10.5 K/uL   RBC 3.95 (L) 4.22 - 5.81 MIL/uL   Hemoglobin 10.2 (L) 13.0 - 17.0 g/dL   HCT 33.6 (L) 39.0 - 52.0 %   MCV 85.1  80.0 - 100.0 fL   MCH 25.8 (L) 26.0 - 34.0 pg   MCHC 30.4 30.0 - 36.0 g/dL   RDW 16.9 (H) 11.5 - 15.5 %   Platelets 580 (H) 150 - 400 K/uL   nRBC 0.3 (H) 0.0 - 0.2 %    Comment: Performed at [facility], [place] [address], [place], [place] [address]    CT ABDOMEN PELVIS W CONTRAST  Result Date: 07/25/2022 CLINICAL DATA:  Post extended LEFT hemicolectomy on 07/18/2012, postop day 7, gas at colorectal anastomosis question delayed leak EXAM: CT ABDOMEN AND PELVIS WITH CONTRAST TECHNIQUE: Multidetector CT imaging of the abdomen and pelvis was performed using the standard protocol following bolus administration of intravenous contrast. RADIATION DOSE REDUCTION: This exam was performed according to the departmental dose-optimization program which includes automated exposure control, adjustment of the mA and/or kV according to patient size and/or use of iterative reconstruction  technique. CONTRAST:  168m OMNIPAQUE IOHEXOL 300 MG/ML SOLN IV. Rectal contrast. COMPARISON:  Earlier study of 07/25/2022 FINDINGS: Lower chest: Bibasilar atelectasis Hepatobiliary: Gallbladder and liver normal appearance Pancreas: Normal appearance Spleen: Normal appearance Adrenals/Urinary Tract: Tiny cyst inferior pole RIGHT kidney again seen, 10 mm diameter; no follow-up imaging recommended. Adrenal glands, kidneys, ureters, and bladder otherwise normal appearance Stomach/Bowel: Post LEFT hemicolectomy with sigmoid anastomosis. Mild colonic wall thickening at anastomosis. Small extraluminal gas collection seen adjacent to the anastomosis on the previous exam is smaller in size and now contains a small amount of contrast consistent with anastomotic leak. Stomach and remaining bowel loops normal appearance. Vascular/Lymphatic: Vascular structures patent.  No adenopathy. Reproductive: Unremarkable prostate gland and seminal vesicles Other: Persistent significant edema and extraluminal gas adjacent to sigmoid colon. Significant free air. Expected postsurgical changes along LEFT colon bed at site of resection. No ascites. No hernia. Musculoskeletal: Unremarkable IMPRESSION: Post LEFT hemicolectomy with sigmoid anastomosis. Small extraluminal gas collection seen adjacent to the anastomosis on the previous exam is smaller in size and now contains a small amount of contrast consistent with anastomotic leak. Persistent significant edema and extraluminal gas adjacent to the sigmoid colon as well as persistent free air. Electronically Signed   By: MLavonia DanaM.D.   On: 07/25/2022 10:52   CT ABDOMEN PELVIS W CONTRAST  Result Date: 07/25/2022 CLINICAL DATA:  47year old male with abdominal pain. History of GI bleeding requiring abdominal surgery. Postoperative day 7 status post extended left hemicolectomy, repair of anastomosis, and en bloc resection of colorectal anastomosis and omentum. Hospital discharge 3 days ago.  EXAM: CT ABDOMEN AND PELVIS WITH CONTRAST TECHNIQUE: Multidetector CT imaging of the abdomen and pelvis was performed using the standard protocol following bolus administration of intravenous contrast. RADIATION DOSE REDUCTION: This exam was performed according to the departmental dose-optimization program which includes automated exposure control, adjustment of the mA and/or kV according to patient size and/or use of iterative reconstruction technique. CONTRAST:  1046mOMNIPAQUE IOHEXOL 300 MG/ML  SOLN COMPARISON:  CTA abdomen and pelvis 06/10/2022. FINDINGS: Lower chest: Platelike bilateral lung base atelectasis. No pericardial or pleural effusion. Hepatobiliary: Pneumoperitoneum scattered along the liver margins. Otherwise negative liver and gallbladder. Pancreas: Negative. Spleen: Negative aside from adjacent pneumoperitoneum. Adrenals/Urinary Tract: Normal adrenal glands. Nonobstructed kidneys. Symmetric renal enhancement and contrast excretion on the delayed images. No hydronephrosis, and proximal ureters are normal. The left ureter passes through an area of mesenteric stranding along the distal colon but remains nonenlarged. Normal left ureterovesical junction. Unremarkable bladder. Stomach/Bowel: Mild to moderate stool ball in the rectum. Pelvic  inlet large bowel anastomosis series 2, image 70, with adjacent elongated 3-4 cm area of extraluminal gas in the mesentery series 2, image 68. Regional mesenteric edema and/or inflammation tracking along that loop. Upstream of the anastomosis the large bowel is mostly decompressed, with some feces. Transverse colon is more gas distended. And there are numerous small foci of pneumoperitoneum in the bilateral upper abdomen along the greater omentum and underlying the diaphragm. Retained stool in the right colon to the hepatic flexure. Appendix appears normal on series 2, image 61. Negative terminal ileum. Gas and fluid containing small bowel loops are mostly  decompressed in the abdomen. The stomach and duodenum are also mostly decompressed. Broad-based 7 cm area of mild to moderate mesenteric inflammation also in the left upper quadrant, former site of the splenic flexure. There is no organized or drainable fluid collection identified in the abdomen. Postoperative changes to the ventral abdominal wall, including a small volume of subcutaneous gas in the right lower abdomen series 2, image 84. Bilateral flank edema. No abdominal wall fluid collection. Vascular/Lymphatic: Major arterial structures and the portal venous system in the abdomen and pelvis appear patent. Normal caliber abdominal aorta. Mild iliofemoral atherosclerosis. No lymphadenopathy identified. Reproductive: Negative. Other: Trace presacral fluid or edema. Musculoskeletal: No acute or suspicious osseous lesion. IMPRESSION: 1. Pneumoperitoneum, greater than expected for postoperative day 7, and with a suspicious 3-4 cm focus of gas within the mesentery adjacent to the distal large bowel anastomosis. Consider anastomotic leak, see coronal images 51 through 54. Recommend Surgery consultation. 2. Edema and/or inflammation in the mesentery at the splenic flexure and along the distal colon anastomosis in #1. No organized or drainable fluid collection in the abdomen. No evidence of bowel obstruction. 3. Mild lung base atelectasis. Electronically Signed   By: Genevie Ann M.D.   On: 07/25/2022 05:52    Past Medical History:  Diagnosis Date   Anxiety    COVID-19 05/10/2021   Depression    Diabetes mellitus without complication ([place])    Diverticulitis of large intestine with perforation with bleeding 2015, 2017   Laparoscopic sigmoid colectomy 2017   Genital herpes    Hypertension    Irritability and anger    due to stress    Past Surgical History:  Procedure Laterality Date   COLON RESECTION  07/2022   robotic extended left hemicolectomy colon resection with incarcerated incisional hernia repair  ([facility]io Bach)   COLONOSCOPY W/ POLYPECTOMY  09/2014   diverticulosis ([place])   COLONOSCOPY WITH PROPOFOL N/A 06/11/2022   2 small polyps not removed, diverticulosis, healthy anastomosis, non-bleeding int hem [name], [name])   CYSTOSCOPY W/ RETROGRADES Bilateral 08/16/2015   [name], MD   CYSTOSCOPY WITH STENT PLACEMENT Bilateral 08/16/2015   [name], MD   LAPAROSCOPIC SIGMOID COLECTOMY N/A 08/16/2015   recurrent diverticulitis; [name], MD   PROCTOSCOPY N/A 07/18/2022   Procedure: RIGID PROCTOSCOPY;  Surgeon: Michael Boston, MD;  Location: [facility];  Service: General;  Laterality: N/A;   REFRACTIVE SURGERY  1999   LASIK    Social History   Socioeconomic History   Marital status: Married    Spouse name: Not on file   Number of children: Not on file   Years of education: Not on file   Highest education level: Not on file  Occupational History   Not on file  Tobacco Use   Smoking status: Never   Smokeless tobacco: Former    Types: Chew, Snuff  Vaping Use   Vaping  Use: Former  Substance and Sexual Activity   Alcohol use: Yes    Alcohol/week: 1.0 standard drink of alcohol    Types: 1 Cans of beer per week    Comment: socially   Drug use: No    Comment: some in HS   Sexual activity: Yes    Birth control/protection: None  Other Topics Concern   Not on file  Social History Narrative   Lives with wife, 2 children and mother   Occ: Leisure centre manager guard   Edu: tech school   Activity: active at work   Diet: on keto diet   Social Determinants of Health   Financial Resource Strain: Not on file  Food Insecurity: No Food Insecurity (07/25/2022)   Hunger Vital Sign    Worried About Running Out of Food in the Last Year: Never true    Ran Out of Food in the Last Year: Never true  Transportation Needs: No Transportation Needs (07/25/2022)   PRAPARE - Transportation    Lack of Transportation (Medical): No    Lack of Transportation (Non-Medical): No  Physical  Activity: Not on file  Stress: Not on file  Social Connections: Not on file  Intimate Partner Violence: Not At Risk (07/25/2022)   Humiliation, Afraid, Rape, and Kick questionnaire    Fear of Current or Ex-Partner: No    Emotionally Abused: No    Physically Abused: No    Sexually Abused: No    Family History  Adopted: Yes  Family history unknown: Yes    Current Facility-Administered Medications  Medication Dose Route Frequency Provider Last Rate Last Admin   acetaminophen (TYLENOL) tablet 325-650 mg  325-650 mg Oral Q6H PRN Michael Boston, MD   650 mg at 07/29/22 0547   alum & mag hydroxide-simeth (MAALOX/MYLANTA) 200-200-20 MG/5ML suspension 30 mL  30 mL Oral Q6H PRN Michael Boston, MD   30 mL at 07/25/22 1709   ciprofloxacin (CIPRO) IVPB 400 mg  400 mg Intravenous Q12H Coralie Keens, MD 200 mL/hr at 07/29/22 0107 400 mg at 07/29/22 0107   diphenhydrAMINE (BENADRYL) 12.5 MG/5ML elixir 12.5 mg  12.5 mg Oral Q6H PRN Michael Boston, MD       Or   diphenhydrAMINE (BENADRYL) injection 12.5 mg  12.5 mg Intravenous Q6H PRN Michael Boston, MD       enoxaparin (LOVENOX) injection 40 mg  40 mg Subcutaneous Q24H Michael Boston, MD   40 mg at 07/28/22 1133   HYDROmorphone (DILAUDID) injection 0.5-2 mg  0.5-2 mg Intravenous Q2H PRN Michael Boston, MD   1 mg at 07/25/22 1258   lip balm (CARMEX) ointment   Topical BID Michael Boston, MD   Given at 07/28/22 2206   magic mouthwash  15 mL Oral QID PRN Michael Boston, MD       menthol-cetylpyridinium (CEPACOL) lozenge 3 mg  1 lozenge Oral PRN Michael Boston, MD       methocarbamol (ROBAXIN) 1,000 mg in dextrose 5 % 100 mL IVPB  1,000 mg Intravenous Q6H PRN Michael Boston, MD       methocarbamol (ROBAXIN) tablet 1,000 mg  1,000 mg Oral Q6H PRN Michael Boston, MD   1,000 mg at 07/29/22 0138   metoprolol tartrate (LOPRESSOR) injection 5 mg  5 mg Intravenous Q6H PRN Michael Boston, MD       metroNIDAZOLE (FLAGYL) IVPB 500 mg  500 mg Intravenous Q12H Coralie Keens, MD 100 mL/hr at 07/29/22 0215 500 mg at 07/29/22 0215   ondansetron (ZOFRAN-ODT) disintegrating tablet 4  mg  4 mg Oral Q6H PRN Michael Boston, MD       Or   ondansetron [facility]) injection 4 mg  4 mg Intravenous Q6H PRN Michael Boston, MD       phenol (CHLORASEPTIC) mouth spray 2 spray  2 spray Mouth/Throat PRN Michael Boston, MD       prochlorperazine (COMPAZINE) tablet 10 mg  10 mg Oral Q6H PRN Michael Boston, MD       Or   prochlorperazine (COMPAZINE) injection 5-10 mg  5-10 mg Intravenous Q6H PRN Michael Boston, MD       simethicone (MYLICON) chewable tablet 40 mg  40 mg Oral Q6H PRN Michael Boston, MD         No Known Allergies  Signed:   Adin Hector, MD, FACS, MASCRS Esophageal, Gastrointestinal & Colorectal Surgery Robotic and Minimally Invasive Surgery  [facility] [address] [place] [id number], [place], [place] [address] [phone] Fax [phone] Main  CONTACT INFORMATION:  Weekday (9AM-5PM): Call [facility] main office at [phone]  Weeknight (5PM-9AM) or Weekend/Holiday: Check [email or web address] (password " [email or web address]") for General Surgery [facility] coverage  (Please, do not use SecureChat as it is not reliable communication to reach operating surgeons for immediate patient care given surgeries/outpatient duties/clinic/cross-coverage/off post-call which would lead to a delay in care.  Epic staff messaging available for outptient concerns, but may not be answered for 48 hours or more).     07/29/2022, 8:22 AM

## 2022-07-29 NOTE — Progress Notes (Signed)
Discharge instructions given to patient and all questions were answered.  

## 2022-07-30 ENCOUNTER — Telehealth: Payer: Self-pay

## 2022-07-30 LAB — CULTURE, BLOOD (ROUTINE X 2)
Culture: NO GROWTH
Culture: NO GROWTH
Special Requests: ADEQUATE

## 2022-07-30 NOTE — Transitions of Care (Post Inpatient/ED Visit) (Unsigned)
   07/30/2022  Name: Neil Mcintyre MRN: [id number] DOB: [birth year]  Today's TOC FU Call Status: Today's TOC FU Call Status:: Unsuccessul Call (1st Attempt) Unsuccessful Call (1st Attempt) Date: 07/30/22  Attempted to reach the patient regarding the most recent Inpatient/ED visit.  Follow Up Plan: Additional outreach attempts will be made to reach the patient to complete the Transitions of Care (Post Inpatient/ED visit) call.  Wife asked that I call back later- pt not at home  T[place]en [place] [phone]

## 2022-07-31 NOTE — Transitions of Care (Post Inpatient/ED Visit) (Signed)
   07/31/2022  Name: Neil Mcintyre MRN: [id number] DOB: [birth year]  Today's TOC FU Call Status: Today's TOC FU Call Status:: Successful TOC FU Call Competed Unsuccessful Call (1st Attempt) Date: 07/30/22 [facility] FU Call Complete Date: 07/31/22  Transition Care Management Follow-up Telephone Call Date of Discharge: 07/29/22 Discharge Facility: [facility]) Type of Discharge: Inpatient Admission Primary Inpatient Discharge Diagnosis:: Diverticulitis How have you been since you were released from the hospital?: Better Any questions or concerns?: No  Items Reviewed: Did you receive and understand the discharge instructions provided?: Yes Medications obtained and verified?: Yes (Medications Reviewed) Any new allergies since your discharge?: No Dietary orders reviewed?: NA Do you have support at home?: Yes People in Home: significant other  Home Care and Equipment/Supplies: [place] Ordered?: No Any new equipment or medical supplies ordered?: No  Functional Questionnaire: Do you need assistance with bathing/showering or dressing?: No Do you need assistance with meal preparation?: No Do you need assistance with eating?: No Do you have difficulty maintaining continence: No Do you need assistance with getting out of bed/getting out of a chair/moving?: No Do you have difficulty managing or taking your medications?: No  Folllow up appointments reviewed: PCP Follow-up appointment confirmed?: No (specialist) MD Provider Line [phone] Given: Yes [facility] Follow-up appointment confirmed?: Yes Date of Specialist follow-up appointment?: 08/07/22 Follow-Up Specialty Provider:: Dr Johney Maine Do you need transportation to your follow-up appointment?: No Do you understand care options if your condition(s) worsen?: Yes-patient verbalized understanding    [place] LPN [facility] Direct Dial [phone]

## 2022-09-18 ENCOUNTER — Ambulatory Visit: Payer: 59 | Admitting: Family Medicine

## 2022-10-25 ENCOUNTER — Other Ambulatory Visit: Payer: Self-pay | Admitting: Family Medicine

## 2022-10-25 DIAGNOSIS — F39 Unspecified mood [affective] disorder: Secondary | ICD-10-CM

## 2022-10-25 NOTE — Telephone Encounter (Signed)
Noted  

## 2022-10-25 NOTE — Telephone Encounter (Signed)
E-scribed refill.  Plz schedule CPE and fasting lab visits to prevent delays in future refills.  

## 2022-10-25 NOTE — Telephone Encounter (Signed)
Patient scheduled or follow up on 7/2

## 2022-10-25 NOTE — Telephone Encounter (Signed)
LVMTCB and sched

## 2022-10-28 ENCOUNTER — Ambulatory Visit
Admission: EM | Admit: 2022-10-28 | Discharge: 2022-10-28 | Disposition: A | Discharge status: Home / Self Care | Payer: 59 | Attending: Emergency Medicine | Admitting: Emergency Medicine

## 2022-10-28 DIAGNOSIS — Z1152 Encounter for screening for COVID-19: Secondary | ICD-10-CM | POA: Diagnosis not present

## 2022-10-28 DIAGNOSIS — B9789 Other viral agents as the cause of diseases classified elsewhere: Secondary | ICD-10-CM | POA: Diagnosis not present

## 2022-10-28 DIAGNOSIS — K5730 Diverticulosis of large intestine without perforation or abscess without bleeding: Secondary | ICD-10-CM | POA: Insufficient documentation

## 2022-10-28 DIAGNOSIS — J069 Acute upper respiratory infection, unspecified: Secondary | ICD-10-CM | POA: Insufficient documentation

## 2022-10-28 NOTE — Discharge Instructions (Signed)
Your COVID test is pending.    Take Tylenol as needed for fever or discomfort.  Rest and keep yourself hydrated.    Follow-up with your primary care provider if your symptoms are not improving.     

## 2022-10-28 NOTE — ED Triage Notes (Signed)
Patient to Urgent Care with complaints of nasal congestion/ cough/ temp 99.4. Symptoms started yesterday.  Reports son and wife are both sick with same symptoms.   Has taken dayquil and tylenol today/ nyquil last night.

## 2022-10-28 NOTE — ED Provider Notes (Signed)
[name]    CSN: [id number] Arrival date & time: 10/28/22  1750      History   Chief Complaint Chief Complaint  Patient presents with   URI    HPI FILBERTO FLUCKIGER is a 47 y.o. male.  Patient presents with 1 day history of congestion, sore throat, cough.  Treating with Dayquil and Nyquil.  No fever, rash, shortness of breath, vomiting, diarrhea, or other symptoms.  His wife and son have similar symptoms.  Patient's medical history includes diabetes, hypertension, morbid obesity seasonal allergies.  The history is provided by the patient and medical records.    Past Medical History:  Diagnosis Date   Anxiety    COVID-19 05/10/2021   Depression    Diabetes mellitus without complication (HCC)    Diverticulitis of large intestine with perforation with bleeding 2015, 2017   Laparoscopic sigmoid colectomy 2017   Genital herpes    Hypertension    Irritability and anger    due to stress    Patient Active Problem List   Diagnosis Date Noted   Diverticulitis 07/25/2022   Diverticulitis of colon with bleeding 07/18/2022   Diabetes mellitus type 2, noninsulin dependent (HCC) 07/02/2022   Engages in vaping 07/02/2022   Colonic hemorrhage 07/02/2022   Incarcerated incisional hernia 07/02/2022   Diverticulosis of colon 07/02/2022   Vitamin B12 deficiency 06/20/2022   Acute blood loss anemia 06/12/2022   Iron deficiency anemia 06/12/2022   Hypokalemia 06/09/2022   Lower GI bleed 06/09/2022   Left-sided headache 02/25/2022   Seasonal allergic rhinitis 09/07/2019   Enlargement of right palatine tonsil 09/07/2019   Genital herpes    Weak urinary stream 09/04/2017   Mood disorder (HCC) 02/21/2017   Health maintenance examination 10/30/2016   Type 2 diabetes mellitus with other specified complication (HCC) 10/30/2016   Dyslipidemia 10/30/2016   Obesity, morbid, BMI 40.0-49.9 (HCC) 07/29/2016   Anxiety 02/21/2015   Primary hypertension 02/21/2015    Past Surgical  History:  Procedure Laterality Date   COLON RESECTION  07/2022   robotic extended left hemicolectomy colon resection with incarcerated incisional hernia repair ([place])   COLONOSCOPY W/ POLYPECTOMY  09/2014   diverticulosis ([place])   COLONOSCOPY WITH PROPOFOL N/A 06/11/2022   2 small polyps not removed, diverticulosis, healthy anastomosis, non-bleeding int hem Timothy Lasso, Viviann Spare)   CYSTOSCOPY W/ RETROGRADES Bilateral 08/16/2015   Hildred Laser, MD   CYSTOSCOPY WITH STENT PLACEMENT Bilateral 08/16/2015   Hildred Laser, MD   LAPAROSCOPIC SIGMOID COLECTOMY N/A 08/16/2015   recurrent diverticulitis; Ricarda Frame, MD   PROCTOSCOPY N/A 07/18/2022   Procedure: RIGID PROCTOSCOPY;  Surgeon: Karie Soda, MD;  Location: [facility];  Service: General;  Laterality: N/A;   REFRACTIVE SURGERY  1999   LASIK       Home Medications    Prior to Admission medications   Medication Sig Start Date End Date Taking? Authorizing Provider  acetaminophen (TYLENOL) 500 MG tablet Take 1,000 mg by mouth every 6 (six) hours as needed for mild pain.    [provider]  amLODipine (NORVASC) 5 MG tablet Take 1 tablet (5 mg total) by mouth daily. 02/25/22   Eustaquio Boyden, MD  amoxicillin-clavulanate (AUGMENTIN) 875-125 MG tablet Take 1 tablet by mouth 2 (two) times daily. Patient not taking: Reported on 10/28/2022 07/26/22   Karie Soda, MD  buPROPion (WELLBUTRIN XL) 150 MG 24 hr tablet TAKE 1 TABLET BY MOUTH EVERY DAY 10/25/22   Eustaquio Boyden, MD  cyanocobalamin (VITAMIN B12) 500  MCG tablet Take 1 tablet (500 mcg total) by mouth daily. 06/20/22   Eustaquio Boyden, MD  ferrous sulfate 325 (65 FE) MG tablet Take 1 tablet (325 mg total) by mouth daily. Patient not taking: Reported on 07/08/2022 06/13/22 07/13/22  Marrion Coy, MD  methocarbamol (ROBAXIN) 500 MG tablet Take 1 tablet (500 mg) by mouth every 8 hours as needed for muscle spasms. Patient not taking: Reported on 10/28/2022 07/18/22   Karie Soda, MD  metoprolol succinate (TOPROL-XL) 25 MG 24 hr tablet Take 1 tablet (25 mg total) by mouth daily. 02/25/22   Eustaquio Boyden, MD  oxyCODONE (OXY IR/ROXICODONE) 5 MG immediate release tablet Take 1 - 2 tablets (5 - 10 mg total) by mouth every 6 hours as needed for breakthrough pain or severe pain. Patient not taking: Reported on 10/28/2022 07/22/22   Karie Soda, MD  valACYclovir (VALTREX) 500 MG tablet Take 1 tablet (500 mg total) by mouth daily. 05/24/22   Eustaquio Boyden, MD    Family History Family History  Adopted: Yes  Family history unknown: Yes    Social History Social History   Tobacco Use   Smoking status: Never   Smokeless tobacco: Former    Types: Chew, Snuff  Vaping Use   Vaping Use: Every day  Substance Use Topics   Alcohol use: Yes    Alcohol/week: 1.0 standard drink of alcohol    Types: 1 Cans of beer per week    Comment: socially   Drug use: No    Comment: some in HS     Allergies   Patient has no known allergies.   Review of Systems Review of Systems  Constitutional:  Negative for chills and fever.  HENT:  Positive for congestion and sore throat. Negative for ear pain.   Respiratory:  Positive for cough. Negative for shortness of breath.   Cardiovascular:  Negative for chest pain and palpitations.  Gastrointestinal:  Negative for diarrhea and vomiting.     Physical Exam Triage Vital Signs ED Triage Vitals  Enc Vitals Group     BP 10/28/22 1844 (!) 155/89     Pulse Rate 10/28/22 1837 (!) 122     Resp 10/28/22 1837 18     Temp 10/28/22 1837 98.3 F (36.8 C)     Temp src --      SpO2 10/28/22 1837 94 %     Weight --      Height --      Head Circumference --      Peak Flow --      Pain Score 10/28/22 1842 0     Pain Loc --      Pain Edu? --      Excl. in GC? --    No data found.  Updated Vital Signs BP (!) 155/89   Pulse (!) 122   Temp 98.3 F (36.8 C)   Resp 18   SpO2 94%   Visual Acuity Right Eye Distance:    Left Eye Distance:   Bilateral Distance:    Right Eye Near:   Left Eye Near:    Bilateral Near:     Physical Exam Vitals and nursing note reviewed.  Constitutional:      General: He is not in acute distress.    Appearance: He is well-developed. He is obese. He is not ill-appearing.  HENT:     Right Ear: Tympanic membrane normal.     Left Ear: Tympanic membrane normal.     Nose:  Congestion present.     Mouth/Throat:     Mouth: Mucous membranes are moist.     Pharynx: Oropharynx is clear.  Cardiovascular:     Rate and Rhythm: Normal rate and regular rhythm.     Heart sounds: Normal heart sounds.  Pulmonary:     Effort: Pulmonary effort is normal. No respiratory distress.     Breath sounds: Normal breath sounds.  Musculoskeletal:     Cervical back: Neck supple.  Skin:    General: Skin is warm and dry.  Neurological:     Mental Status: He is alert.  Psychiatric:        Mood and Affect: Mood normal.        Behavior: Behavior normal.      UC Treatments / Results  Labs (all labs ordered are listed, but only abnormal results are displayed) Labs Reviewed  SARS CORONAVIRUS 2 (TAT 6-24 HRS)    EKG   Radiology No results found.  Procedures Procedures (including critical care time)  Medications Ordered in UC Medications - No data to display  Initial Impression / Assessment and Plan / UC Course  I have reviewed the triage vital signs and the nursing notes.  Pertinent labs & imaging results that were available during my care of the patient were reviewed by me and considered in my medical decision making (see chart for details).    Viral URI.  Per patient request, COVID pending.  Discussed symptomatic treatment including Tylenol, rest, hydration.  Instructed patient to follow up with his PCP if his symptoms are not improving.  He agrees to plan of care.   Final Clinical Impressions(s) / UC Diagnoses   Final diagnoses:  Viral URI     Discharge Instructions       Your COVID test is pending.    Take Tylenol as needed for fever or discomfort.  Rest and keep yourself hydrated.    Follow-up with your primary care provider if your symptoms are not improving.         ED Prescriptions   None    PDMP not reviewed this encounter.   Mickie Bail, NP 10/28/22 1919

## 2022-10-30 LAB — SARS CORONAVIRUS 2 (TAT 6-24 HRS): SARS Coronavirus 2: NEGATIVE

## 2022-12-03 ENCOUNTER — Ambulatory Visit: Payer: 59 | Admitting: Family Medicine

## 2022-12-27 ENCOUNTER — Other Ambulatory Visit: Payer: Self-pay | Admitting: Family Medicine

## 2022-12-31 NOTE — Telephone Encounter (Signed)
Needs annual physical appointment for additional refills.  Request denied.

## 2023-03-08 ENCOUNTER — Other Ambulatory Visit: Payer: Self-pay | Admitting: Family Medicine

## 2023-03-08 ENCOUNTER — Encounter: Payer: Self-pay | Admitting: Family Medicine

## 2023-03-09 ENCOUNTER — Emergency Department (HOSPITAL_COMMUNITY): Payer: 59

## 2023-03-09 ENCOUNTER — Emergency Department (HOSPITAL_COMMUNITY)
Admission: EM | Admit: 2023-03-09 | Discharge: 2023-03-09 | Disposition: A | Discharge status: Home / Self Care | Payer: 59 | Attending: Emergency Medicine | Admitting: Emergency Medicine

## 2023-03-09 ENCOUNTER — Other Ambulatory Visit: Payer: Self-pay

## 2023-03-09 ENCOUNTER — Encounter (HOSPITAL_COMMUNITY): Payer: Self-pay

## 2023-03-09 DIAGNOSIS — K55069 Acute infarction of intestine, part and extent unspecified: Secondary | ICD-10-CM | POA: Insufficient documentation

## 2023-03-09 DIAGNOSIS — Z79899 Other long term (current) drug therapy: Secondary | ICD-10-CM | POA: Insufficient documentation

## 2023-03-09 DIAGNOSIS — E119 Type 2 diabetes mellitus without complications: Secondary | ICD-10-CM | POA: Diagnosis not present

## 2023-03-09 DIAGNOSIS — D72829 Elevated white blood cell count, unspecified: Secondary | ICD-10-CM | POA: Diagnosis not present

## 2023-03-09 DIAGNOSIS — I10 Essential (primary) hypertension: Secondary | ICD-10-CM | POA: Diagnosis not present

## 2023-03-09 DIAGNOSIS — R1032 Left lower quadrant pain: Secondary | ICD-10-CM | POA: Diagnosis present

## 2023-03-09 LAB — CBC WITH DIFFERENTIAL/PLATELET
Abs Immature Granulocytes: 0.15 10*3/uL — ABNORMAL HIGH (ref 0.00–0.07)
Basophils Absolute: 0.1 10*3/uL (ref 0.0–0.1)
Basophils Relative: 1 %
Eosinophils Absolute: 0.2 10*3/uL (ref 0.0–0.5)
Eosinophils Relative: 2 %
HCT: 50.2 % (ref 39.0–52.0)
Hemoglobin: 15.8 g/dL (ref 13.0–17.0)
Immature Granulocytes: 1 %
Lymphocytes Relative: 12 %
Lymphs Abs: 1.5 10*3/uL (ref 0.7–4.0)
MCH: 26.7 pg (ref 26.0–34.0)
MCHC: 31.5 g/dL (ref 30.0–36.0)
MCV: 84.9 fL (ref 80.0–100.0)
Monocytes Absolute: 1.6 10*3/uL — ABNORMAL HIGH (ref 0.1–1.0)
Monocytes Relative: 12 %
Neutro Abs: 9.5 10*3/uL — ABNORMAL HIGH (ref 1.7–7.7)
Neutrophils Relative %: 72 %
Platelets: 303 10*3/uL (ref 150–400)
RBC: 5.91 MIL/uL — ABNORMAL HIGH (ref 4.22–5.81)
RDW: 17.4 % — ABNORMAL HIGH (ref 11.5–15.5)
WBC: 13.1 10*3/uL — ABNORMAL HIGH (ref 4.0–10.5)
nRBC: 0 % (ref 0.0–0.2)

## 2023-03-09 LAB — URINALYSIS, ROUTINE W REFLEX MICROSCOPIC
Bilirubin Urine: NEGATIVE
Glucose, UA: NEGATIVE mg/dL
Hgb urine dipstick: NEGATIVE
Ketones, ur: NEGATIVE mg/dL
Leukocytes,Ua: NEGATIVE
Nitrite: NEGATIVE
Protein, ur: NEGATIVE mg/dL
Specific Gravity, Urine: 1.017 (ref 1.005–1.030)
pH: 5 (ref 5.0–8.0)

## 2023-03-09 LAB — COMPREHENSIVE METABOLIC PANEL
ALT: 17 U/L (ref 0–44)
AST: 12 U/L — ABNORMAL LOW (ref 15–41)
Albumin: 3.8 g/dL (ref 3.5–5.0)
Alkaline Phosphatase: 70 U/L (ref 38–126)
Anion gap: 9 (ref 5–15)
BUN: 11 mg/dL (ref 6–20)
CO2: 25 mmol/L (ref 22–32)
Calcium: 9.2 mg/dL (ref 8.9–10.3)
Chloride: 103 mmol/L (ref 98–111)
Creatinine, Ser: 0.7 mg/dL (ref 0.61–1.24)
GFR, Estimated: >60 mL/min (ref >60)
Glucose, Bld: 107 mg/dL — ABNORMAL HIGH (ref 70–99)
Potassium: 3.8 mmol/L (ref 3.5–5.1)
Sodium: 137 mmol/L (ref 135–145)
Total Bilirubin: 1 mg/dL (ref 0.3–1.2)
Total Protein: 7.2 g/dL (ref 6.5–8.1)

## 2023-03-09 LAB — LIPASE, BLOOD: Lipase: 34 U/L (ref 11–51)

## 2023-03-09 LAB — I-STAT CG4 LACTIC ACID, ED
Lactic Acid, Venous: 0.7 mmol/L (ref 0.5–1.9)
Lactic Acid, Venous: 0.5 mmol/L (ref 0.5–1.9)

## 2023-03-09 MED ORDER — METOPROLOL SUCCINATE ER 25 MG PO TB24: 25.0000 mg | ORAL_TABLET | Freq: Every day | ORAL | 1 refills | Status: DC

## 2023-03-09 MED ORDER — AMOXICILLIN-POT CLAVULANATE 875-125 MG PO TABS
1.0000 | ORAL_TABLET | Freq: Once | ORAL | Status: AC
  Administered 2023-03-09: 1 via ORAL
  Filled 2023-03-09: qty 1

## 2023-03-09 MED ORDER — AMLODIPINE BESYLATE 5 MG PO TABS: 5.0000 mg | ORAL_TABLET | Freq: Every day | ORAL | 1 refills | Status: DC

## 2023-03-09 MED ORDER — IOHEXOL 300 MG/ML  SOLN
100.0000 mL | Freq: Once | INTRAMUSCULAR | Status: AC | PRN
  Administered 2023-03-09: 100 mL via INTRAVENOUS

## 2023-03-09 MED ORDER — MORPHINE SULFATE (PF) 4 MG/ML IV SOLN
4.0000 mg | Freq: Once | INTRAVENOUS | Status: AC
  Administered 2023-03-09: 4 mg via INTRAVENOUS
  Filled 2023-03-09: qty 1

## 2023-03-09 MED ORDER — AMLODIPINE BESYLATE 5 MG PO TABS
5.0000 mg | ORAL_TABLET | Freq: Once | ORAL | Status: AC
  Administered 2023-03-09: 5 mg via ORAL
  Filled 2023-03-09: qty 1

## 2023-03-09 MED ORDER — METOPROLOL SUCCINATE ER 25 MG PO TB24: 25.0000 mg | ORAL_TABLET | Freq: Every day | ORAL | Status: DC

## 2023-03-09 MED ORDER — AMOXICILLIN-POT CLAVULANATE 875-125 MG PO TABS: 1.0000 | ORAL_TABLET | Freq: Two times a day (BID) | ORAL | 0 refills | Status: DC

## 2023-03-09 MED ORDER — HYDROCODONE-ACETAMINOPHEN 5-325 MG PO TABS: 1.0000 | ORAL_TABLET | Freq: Four times a day (QID) | ORAL | 0 refills | Status: AC | PRN

## 2023-03-09 NOTE — ED Triage Notes (Signed)
Patient reports left sided abdominal pain x 1 week. Patient reports constipation x 4 days, able to pass gas. Denies nausea, vomiting, and fevers. Hx of bowel resection.  Patient also has not been able to afford his HTN medications. BP at time of arrival 200/113.

## 2023-03-09 NOTE — ED Provider Notes (Signed)
[place] EMERGENCY DEPARTMENT AT [facility] Provider Note   CSN: [id number] Arrival date & time: 03/09/23  1727     History  Chief Complaint  Patient presents with   Abdominal Pain    Neil Mcintyre is a 47 y.o. male history of bowel resection due to diverticulitis perforation, diabetes, anxiety, depression, hypertension presented with left lower quadrant pain for the past week.  Patient is still having bowel movements and can pass gas and denies hematochezia or melena but states this pain feels very similar to when he had diverticulitis.  Patient denies fevers, nausea vomiting, chest pain, shortness of breath.  Patient also states has been out of his amlodipine metoprolol for the past few days and realized that his blood pressure is elevated however denies vision changes, headaches, numbness/tingling, new onset weakness.  Patient denies constipation/diarrhea, dysuria.  Home Medications Prior to Admission medications   Medication Sig Start Date End Date Taking? Authorizing Provider  amLODipine (NORVASC) 5 MG tablet Take 1 tablet (5 mg total) by mouth daily. 02/25/22  Yes Eustaquio Boyden, MD  amLODipine (NORVASC) 5 MG tablet Take 1 tablet (5 mg total) by mouth daily. 03/09/23 04/08/23 Yes Riot Waterworth, Beverly Gust, PA-C  amoxicillin-clavulanate (AUGMENTIN) 875-125 MG tablet Take 1 tablet by mouth every 12 (twelve) hours. 03/09/23  Yes Netta Corrigan, PA-C  buPROPion (WELLBUTRIN XL) 150 MG 24 hr tablet TAKE 1 TABLET BY MOUTH EVERY DAY 10/25/22  Yes Eustaquio Boyden, MD  EXCEDRIN MIGRAINE [phone] MG tablet Take 1-2 tablets by mouth every 6 (six) hours as needed for headache or migraine.   Yes [provider]  HYDROcodone-acetaminophen (NORCO) 5-325 MG tablet Take 1 tablet by mouth every 6 (six) hours as needed for up to 5 days for moderate pain. 03/09/23 03/14/23 Yes Taneah Masri, Beverly Gust, PA-C  metoprolol succinate (TOPROL-XL) 25 MG 24 hr tablet Take 1 tablet (25 mg total) by mouth  daily. 02/25/22  Yes Eustaquio Boyden, MD  metoprolol succinate (TOPROL-XL) 25 MG 24 hr tablet Take 1 tablet (25 mg total) by mouth daily. 03/09/23 05/08/23 Yes Shamika Pedregon, Beverly Gust, PA-C  MOTRIN IB 200 MG tablet Take 200-400 mg by mouth every 6 (six) hours as needed for mild pain or headache.   Yes [provider]  valACYclovir (VALTREX) 500 MG tablet Take 1 tablet (500 mg total) by mouth daily. 05/24/22  Yes Eustaquio Boyden, MD  amoxicillin-clavulanate (AUGMENTIN) 875-125 MG tablet Take 1 tablet by mouth 2 (two) times daily. Patient not taking: Reported on 10/28/2022 07/26/22   Karie Soda, MD  cyanocobalamin (VITAMIN B12) 500 MCG tablet Take 1 tablet (500 mcg total) by mouth daily. Patient not taking: Reported on 03/09/2023 06/20/22   Eustaquio Boyden, MD  ferrous sulfate 325 (65 FE) MG tablet Take 1 tablet (325 mg total) by mouth daily. Patient not taking: Reported on 03/09/2023 06/13/22 03/09/23  Marrion Coy, MD  methocarbamol (ROBAXIN) 500 MG tablet Take 1 tablet (500 mg) by mouth every 8 hours as needed for muscle spasms. Patient not taking: Reported on 03/09/2023 07/18/22   Karie Soda, MD  oxyCODONE (OXY IR/ROXICODONE) 5 MG immediate release tablet Take 1 - 2 tablets (5 - 10 mg total) by mouth every 6 hours as needed for breakthrough pain or severe pain. Patient not taking: Reported on 10/28/2022 07/22/22   Karie Soda, MD      Allergies    Patient has no known allergies.    Review of Systems   Review of Systems  Gastrointestinal:  Positive  for abdominal pain.    Physical Exam Updated Vital Signs BP (!) 178/109   Pulse 95   Temp 98.5 F (36.9 C) (Oral)   Resp 19   Ht 5\' 7"  (1.702 m)   Wt 112 kg   SpO2 92%   BMI 38.69 kg/m  Physical Exam Vitals reviewed.  Constitutional:      General: He is not in acute distress. HENT:     Head: Normocephalic and atraumatic.  Eyes:     Extraocular Movements: Extraocular movements intact.     Conjunctiva/sclera: Conjunctivae  normal.     Pupils: Pupils are equal, round, and reactive to light.  Cardiovascular:     Rate and Rhythm: Regular rhythm. Tachycardia present.     Pulses: Normal pulses.     Heart sounds: Normal heart sounds.     Comments: 2+ bilateral radial/dorsalis pedis pulses with slightly increased rate Pulmonary:     Effort: Pulmonary effort is normal. No respiratory distress.     Breath sounds: Normal breath sounds.  Abdominal:     Palpations: Abdomen is soft.     Tenderness: There is abdominal tenderness in the left lower quadrant. There is guarding. There is no rebound.  Musculoskeletal:        General: Normal range of motion.     Cervical back: Normal range of motion and neck supple.     Comments: 5 out of 5 bilateral grip/leg extension strength  Skin:    General: Skin is warm and dry.     Capillary Refill: Capillary refill takes less than 2 seconds.  Neurological:     General: No focal deficit present.     Mental Status: He is alert and oriented to person, place, and time.     Comments: Sensation intact in all 4 limbs  Psychiatric:        Mood and Affect: Mood normal.     ED Results / Procedures / Treatments   Labs (all labs ordered are listed, but only abnormal results are displayed) Labs Reviewed  CBC WITH DIFFERENTIAL/PLATELET - Abnormal; Notable for the following components:      Result Value   WBC 13.1 (*)    RBC 5.91 (*)    RDW 17.4 (*)    Neutro Abs 9.5 (*)    Monocytes Absolute 1.6 (*)    Abs Immature Granulocytes 0.15 (*)    All other components within normal limits  COMPREHENSIVE METABOLIC PANEL - Abnormal; Notable for the following components:   Glucose, Bld 107 (*)    AST 12 (*)    All other components within normal limits  LIPASE, BLOOD  URINALYSIS, ROUTINE W REFLEX MICROSCOPIC  I-STAT CG4 LACTIC ACID, ED  I-STAT CG4 LACTIC ACID, ED    EKG None  Radiology CT ABDOMEN PELVIS W CONTRAST  Result Date: 03/09/2023 CLINICAL DATA:  Left lower quadrant  abdominal pain.  Constipation. EXAM: CT ABDOMEN AND PELVIS WITH CONTRAST TECHNIQUE: Multidetector CT imaging of the abdomen and pelvis was performed using the standard protocol following bolus administration of intravenous contrast. RADIATION DOSE REDUCTION: This exam was performed according to the departmental dose-optimization program which includes automated exposure control, adjustment of the mA and/or kV according to patient size and/or use of iterative reconstruction technique. CONTRAST:  OMNIPAQUE IOHEXOL 300 MG/ML  SOLN COMPARISON:  07/28/2022 FINDINGS: Lower chest: No acute abnormality. Hepatobiliary: Hepatic steatosis. Unremarkable gallbladder and biliary tree. Pancreas: Unremarkable. Spleen: Unremarkable. Adrenals/Urinary Tract: Normal adrenal glands. No urinary calculi or hydronephrosis. Nondistended bladder. Stomach/Bowel:  Postoperative change of partial colectomy with sigmoid colon anastomosis. Small amount of residual stranding about the anastomosis likely related to fat necrosis. Additional residual stranding in the left upper quadrant near the splenic flexure is also likely related to fat necrosis. Colonic diverticulosis without diverticulitis. Inflammatory stranding and trace fluid about the small bowel in the left hemiabdomen. This appears to be centered around a 3.2 cm focus of fat (series 7/67). This is favored to represent omental infarct with focal enteritis considered less likely. Normal stomach and appendix. Vascular/Lymphatic: No significant vascular findings are present. No enlarged abdominal or pelvic lymph nodes. Reproductive: Stable. Other: Small volume free fluid in the left hemiabdomen. No free intraperitoneal air. Musculoskeletal: No acute fracture. IMPRESSION: 1. Findings favored to represent omental infarct in the left lower abdomen. 2. Postoperative change of partial colectomy with sigmoid colon anastomosis. Residual stranding about the anastomosis as well as in the left  upper quadrant near the splenic flexure likely related to chronic fat necrosis. 3. Hepatic steatosis. Electronically Signed   By: Minerva Fester M.D.   On: 03/09/2023 22:43    Procedures Procedures    Medications Ordered in ED Medications  amoxicillin-clavulanate (AUGMENTIN) 875-125 MG per tablet 1 tablet (has no administration in time range)  amLODipine (NORVASC) tablet 5 mg (has no administration in time range)  metoprolol succinate (TOPROL-XL) 24 hr tablet 25 mg (has no administration in time range)  morphine (PF) 4 MG/ML injection 4 mg (4 mg Intravenous Given 03/09/23 2037)  iohexol (OMNIPAQUE) 300 MG/ML solution 100 mL (100 mLs Intravenous Contrast Given 03/09/23 2146)    ED Course/ Medical Decision Making/ A&P                                 Medical Decision Making Amount and/or Complexity of Data Reviewed Labs: ordered. Radiology: ordered.  Risk Prescription drug management.   Rolin Barry 47 y.o. presented today for abdominal pain. Working DDx that I considered at this time includes, but not limited to, gastroenteritis, colitis, small bowel obstruction, appendicitis, cholecystitis, hepatobiliary pathology, gastritis, PUD, ACS, aortic dissection pancreatitis, nephrolithiasis, AAA, UTI, pyelonephritis, testicular torsion.  R/o DDx: gastroenteritis, colitis, small bowel obstruction, appendicitis, cholecystitis, hepatobiliary pathology, gastritis, PUD, ACS, aortic dissection pancreatitis, nephrolithiasis, AAA, UTI, pyelonephritis, testicular torsion: These are considered less likely due to history of present illness, physical exam, labs/imaging findings.  Review of prior external notes: 10/28/2022 ED  Unique Tests and My Interpretation:  CBC with differential: Leukocytosis 13.1 CMP: Unremarkable Lipase: Unremarkable UA: Unremarkable Lactic acid: Negative EKG: 94 sinus, no ST elevations or depressions noted, no A-fib CT Abd/Pelvis with contrast: omental  infarct  Discussion with Independent Historian: None  Discussion of Management of Tests:  Tsuei, MD General Surgery  Risk: Medium: prescription drug management  Risk Stratification Score: none  Plan: On exam patient was in no acute distress but mildly tachycardic.  Patient did have tenderness and guarding to left lower quadrant and states this feels very similar to when he had diverticulitis.  Patient not endorsing infectious symptoms and the rest of his physical exam is unremarkable including a neurologic exam.  Patient was hypertensive at 200/113 when I evaluated him and states this to be as he has not been able to take his blood pressure meds.  Patient neuroexam was unremarkable and patient is not endorsing any headaches or vision changes and so we agreed to forego CT scan at this time.  Abdominal labs ordered  and CT scan ordered as well.  Patient given morphine for his pain.  Patient's labs are reassuring however patient does have leukocytosis of 13.1.  CT scan does show omental infarct in the left lower quadrant most likely causing patient's pain.  General surgery be consulted and EKG will be obtained to rule out A-fib.  I talked to general surgery and they recommend Augmentin, norco, with follow-up with his general surgeon to remain hydrated.  Patient will be given his prescription of blood pressure meds and refills prescription as well to help with his blood pressure.  I prescribed the Augmentin as well.  Patient was given return precautions. Patient stable for discharge at this time.  Patient verbalized understanding of plan.  This chart was dictated using voice recognition software.  Despite best efforts to proofread,  errors can occur which can change the documentation meaning.         Final Clinical Impression(s) / ED Diagnoses Final diagnoses:  Uncontrolled hypertension  Omental infarction [facility])    Rx / DC Orders ED Discharge Orders          Ordered     amoxicillin-clavulanate (AUGMENTIN) 875-125 MG tablet  Every 12 hours        03/09/23 2332    HYDROcodone-acetaminophen (NORCO) 5-325 MG tablet  Every 6 hours PRN        03/09/23 2332    metoprolol succinate (TOPROL-XL) 25 MG 24 hr tablet  Daily        03/09/23 2332    amLODipine (NORVASC) 5 MG tablet  Daily        03/09/23 2332              Remi Deter 03/09/23 2333    Alvira Monday, MD 03/10/23 2219

## 2023-03-09 NOTE — Discharge Instructions (Signed)
Please follow-up with Dr. Michaell Cowing in regards to recent symptoms and ER visit.  Today your CT scan shows you have an omental infarct that is causing her pain.  Please take the Augmentin as prescribed for you remain hydrated.  You may take Tylenol every 6 hours needed for pain however pain not controlled by Tylenol you may use the Norco prescribed for you.  I have also refilled your blood pressure meds.  Please follow-up with your primary care provider for long-term management of your hypertension.  If symptoms change or worsen return to ER.

## 2023-03-18 ENCOUNTER — Ambulatory Visit: Payer: 59 | Admitting: Family Medicine

## 2023-03-18 ENCOUNTER — Encounter: Payer: Self-pay | Admitting: Family Medicine

## 2023-03-18 VITALS — BP 172/94 | HR 100 | Temp 98.3°F | Ht 67.0 in | Wt 269.2 lb

## 2023-03-18 DIAGNOSIS — E785 Hyperlipidemia, unspecified: Secondary | ICD-10-CM

## 2023-03-18 DIAGNOSIS — E1169 Type 2 diabetes mellitus with other specified complication: Secondary | ICD-10-CM | POA: Diagnosis not present

## 2023-03-18 DIAGNOSIS — F39 Unspecified mood [affective] disorder: Secondary | ICD-10-CM

## 2023-03-18 DIAGNOSIS — I10 Essential (primary) hypertension: Secondary | ICD-10-CM | POA: Diagnosis not present

## 2023-03-18 DIAGNOSIS — Z23 Encounter for immunization: Secondary | ICD-10-CM | POA: Diagnosis not present

## 2023-03-18 DIAGNOSIS — Z7984 Long term (current) use of oral hypoglycemic drugs: Secondary | ICD-10-CM

## 2023-03-18 DIAGNOSIS — N529 Male erectile dysfunction, unspecified: Secondary | ICD-10-CM | POA: Insufficient documentation

## 2023-03-18 DIAGNOSIS — K55069 Acute infarction of intestine, part and extent unspecified: Secondary | ICD-10-CM | POA: Insufficient documentation

## 2023-03-18 LAB — POCT GLYCOSYLATED HEMOGLOBIN (HGB A1C): Hemoglobin A1C: 6.6 % — AB (ref 4.0–5.6)

## 2023-03-18 MED ORDER — METOPROLOL SUCCINATE ER 25 MG PO TB24: 25.0000 mg | ORAL_TABLET | Freq: Every day | ORAL | 2 refills | Status: DC

## 2023-03-18 MED ORDER — ATORVASTATIN CALCIUM 20 MG PO TABS: 20.0000 mg | ORAL_TABLET | Freq: Every day | ORAL | 2 refills | Status: DC

## 2023-03-18 MED ORDER — SILDENAFIL CITRATE 100 MG PO TABS
50.0000 mg | ORAL_TABLET | Freq: Every day | ORAL | 3 refills | Status: DC | PRN
Start: 2023-03-18 — End: 2024-03-17

## 2023-03-18 MED ORDER — AMLODIPINE BESYLATE 5 MG PO TABS: 5.0000 mg | ORAL_TABLET | Freq: Every day | ORAL | 2 refills | Status: DC

## 2023-03-18 MED ORDER — BUPROPION HCL ER (XL) 150 MG PO TB24
150.0000 mg | ORAL_TABLET | Freq: Every day | ORAL | 2 refills | Status: DC
Start: 2023-03-18 — End: 2024-02-16

## 2023-03-18 NOTE — Patient Instructions (Addendum)
Flu shot today  Request diabetic eye exam next eye exam.  Try metformin 500mg  once daily with breakfast or dinner. Update me with effect.  Continue current BP medicines. Monitor blood pressures at home, let me know if consistently >140/90 to titrate bp medicines.  Start atorvastatin 20mg  daily for cholesterol control in diabetes.  Trial viagra 50-100mg  once daily as needed for relations.  Return in 3 months for physical (fasting).

## 2023-03-18 NOTE — Assessment & Plan Note (Signed)
Overdue for FLP - however not fasting today.  He will return in 3 months fasting for CPE will check labs at that time. The ASCVD Risk score (Arnett DK, et al., 2019) failed to calculate for the following reasons:   Cannot find a previous HDL lab   Cannot find a previous total cholesterol lab

## 2023-03-18 NOTE — Assessment & Plan Note (Signed)
Encourage healthy diet and lifestyle choices to affect sustainable weight loss.

## 2023-03-18 NOTE — Assessment & Plan Note (Signed)
By CT - treated with augmentin abx and pain medication. Symptoms largely resolved. Keep gen surg f/u already scheduled next month

## 2023-03-18 NOTE — Progress Notes (Addendum)
Ph: [phone] Fax: [phone]   Patient ID: Neil Mcintyre, male    DOB: [birth year], 47 y.o.   MRN: [id number]  This visit was conducted in person.  BP (!) 172/94 (BP Location: Right Arm, Cuff Size: Large)   Pulse 100   Temp 98.3 F (36.8 C) (Oral)   Ht 5\' 7"  (1.702 m)   Wt 269 lb 4 oz (122.1 kg)   SpO2 93%   BMI 42.17 kg/m    CC: ER f/u visit  Subjective:   HPI: Neil Mcintyre is a 47 y.o. male presenting on 03/18/2023 for Hospitalization Follow-up (Seen on 03/09/23 at [facility] ED, dx omental infarction; uncontrolled HTN.  ) and Medical Management of Chronic Issues (Here for DM f/u.)   New job - started working at [employer] this past week.   Recent ER evaluation for LLQ abdominal pain found to have omental infarct.  He had prior hospitalization for bowel resection due to diverticulitis perforation 07/2022 (recurrent colon bleeding at splenic flexure as well as incarcerated incisional hernia - extended left hemicolectomy, omentectomy, primary repair of incisional anastomosis).  Also found to have marked hypertension 200/110s due to missing medications due to cost/unemployment.  Hospital records reviewed. Med rec performed.  Labs showed WBC 13, contrasted CT abd/pelvis imaging showed omental infarct LLQ and postop changes of partial colectomy with sigmoid anastomosis, as well as incidental hepatic steatosis.  Treated in ER with augmentin as well as morphine and regular antihypertensives.  General surgery was consulted - rec oral abx, pain medication, good hydration status, and f/u with general surgeon.   Overall feeling better since ER visit.  Never fever.  Bowels moving regularly.   Home health not set up.  Other follow up appointments scheduled: gen surgery Dr Michaell Cowing 04/15/2023  HTN - Compliant with current antihypertensive regimen of Toprol XL 25mg  daily, amlodipine 5mg  daily. Does check blood pressures at home: well controlled at home when on BP meds, hasn't had a  chance to check at home this week. No low blood pressure readings or symptoms of dizziness/syncope. Denies HA, vision changes, CP/tightness, SOB, leg swelling.   Chronic loose stools with daily metamucil fiber intake.   ED - ongoing difficulty for months, trouble maintaining erection. Has not tried   DM - does not regularly check sugars. Compliant with antihyperglycemic regimen which includes: diet controlled. Denies low sugars or hypoglycemic symptoms. Denies paresthesias, blurry vision. Last diabetic eye exam DUE ([facility]). Glucometer brand: trouble affording. Last foot exam: today. DSME: deferred to discuss next visit. Lab Results  Component Value Date   HGBA1C 6.6 (A) 03/18/2023   Diabetic Foot Exam - Simple   Simple Foot Form Diabetic Foot exam was performed with the following findings: Yes 03/18/2023  8:34 AM  Visual Inspection No deformities, no ulcerations, no other skin breakdown bilaterally: Yes Sensation Testing Intact to touch and monofilament testing bilaterally: Yes Pulse Check Posterior Tibialis and Dorsalis pulse intact bilaterally: Yes Comments No claudication    No results found for: "MICROALBUR", "[id number]"      Relevant past medical, surgical, family and social history reviewed and updated as indicated. Interim medical history since our last visit reviewed. Allergies and medications reviewed and updated. Outpatient Medications Prior to Visit  Medication Sig Dispense Refill   amoxicillin-clavulanate (AUGMENTIN) 875-125 MG tablet Take 1 tablet by mouth every 12 (twelve) hours. 14 tablet 0   EXCEDRIN MIGRAINE 250-250-65 MG tablet Take 1-2 tablets by mouth every 6 (six) hours as needed for headache  or migraine.     MOTRIN IB 200 MG tablet Take 200-400 mg by mouth every 6 (six) hours as needed for mild pain or headache.     valACYclovir (VALTREX) 500 MG tablet Take 1 tablet (500 mg total) by mouth daily. 90 tablet 1   amLODipine (NORVASC) 5 MG tablet Take 1  tablet (5 mg total) by mouth daily. 30 tablet 1   buPROPion (WELLBUTRIN XL) 150 MG 24 hr tablet TAKE 1 TABLET BY MOUTH EVERY DAY 90 tablet 0   cyanocobalamin (VITAMIN B12) 500 MCG tablet Take 1 tablet (500 mcg total) by mouth daily.     methocarbamol (ROBAXIN) 500 MG tablet Take 1 tablet (500 mg) by mouth every 8 hours as needed for muscle spasms. 20 tablet 1   metoprolol succinate (TOPROL-XL) 25 MG 24 hr tablet Take 1 tablet (25 mg total) by mouth daily. 30 tablet 1   oxyCODONE (OXY IR/ROXICODONE) 5 MG immediate release tablet Take 1 - 2 tablets (5 - 10 mg total) by mouth every 6 hours as needed for breakthrough pain or severe pain. 30 tablet 0   ferrous sulfate 325 (65 FE) MG tablet Take 1 tablet (325 mg total) by mouth daily. (Patient not taking: Reported on 03/09/2023) 30 tablet 0   No facility-administered medications prior to visit.     Per HPI unless specifically indicated in ROS section below Review of Systems  Objective:  BP (!) 172/94 (BP Location: Right Arm, Cuff Size: Large)   Pulse 100   Temp 98.3 F (36.8 C) (Oral)   Ht 5\' 7"  (1.702 m)   Wt 269 lb 4 oz (122.1 kg)   SpO2 93%   BMI 42.17 kg/m   Wt Readings from Last 3 Encounters:  03/18/23 269 lb 4 oz (122.1 kg)  03/09/23 247 lb (112 kg)  07/22/22 265 lb 14 oz (120.6 kg)      Physical Exam Vitals and nursing note reviewed.  Constitutional:      Appearance: Normal appearance. He is not ill-appearing.  Eyes:     Extraocular Movements: Extraocular movements intact.     Conjunctiva/sclera: Conjunctivae normal.     Pupils: Pupils are equal, round, and reactive to light.  Cardiovascular:     Rate and Rhythm: Normal rate and regular rhythm.     Pulses: Normal pulses.     Heart sounds: Normal heart sounds. No murmur heard. Pulmonary:     Effort: Pulmonary effort is normal. No respiratory distress.     Breath sounds: Normal breath sounds. No wheezing, rhonchi or rales.  Musculoskeletal:     Right lower leg: No edema.      Left lower leg: No edema.     Comments: See HPI for foot exam if done  Skin:    General: Skin is warm and dry.     Findings: No rash.  Neurological:     Mental Status: He is alert.  Psychiatric:        Mood and Affect: Mood normal.        Behavior: Behavior normal.       Results for orders placed or performed in visit on 03/18/23  POCT glycosylated hemoglobin (Hb A1C)  Result Value Ref Range   Hemoglobin A1C 6.6 (A) 4.0 - 5.6 %   HbA1c POC (<> result, manual entry)     HbA1c, POC (prediabetic range)     HbA1c, POC (controlled diabetic range)     Lab Results  Component Value Date   CHOL 221 (  H) 04/14/2019   HDL 54.40 04/14/2019   LDLCALC 135 (H) 04/14/2019   TRIG 158.0 (H) 04/14/2019   CHOLHDL 4 04/14/2019   Assessment & Plan:   Problem List Items Addressed This Visit     Primary hypertension    Chronic, remaining elevated despite current regimen (recently restarted after he had been off meds for a week due to running out).  I asked him to start monitoring BP at home, keep log (provided today) and update [place] with home readings in 1-2 weeks to titrate antihypertensive accordingly. Consider adding ACEI/ARB in diabetic.       Relevant Medications   sildenafil (VIAGRA) 100 MG tablet   atorvastatin (LIPITOR) 20 MG tablet   amLODipine (NORVASC) 5 MG tablet   metoprolol succinate (TOPROL-XL) 25 MG 24 hr tablet   Obesity, morbid, BMI 40.0-49.9 (HCC)    Encourage healthy diet and lifestyle choices to affect sustainable weight loss.       Type 2 diabetes mellitus with other specified complication (HCC) - Primary    Chronic, diet controlled. Start metformin 500mg  once daily, reviewed side effects to monitor for. Consider GLP1RA in future.       Relevant Medications   atorvastatin (LIPITOR) 20 MG tablet   Other Relevant Orders   POCT glycosylated hemoglobin (Hb A1C) (Completed)   Dyslipidemia associated with type 2 diabetes mellitus (HCC)    Overdue for FLP - however  not fasting today.  He will return in 3 months fasting for CPE will check labs at that time. The ASCVD Risk score (Arnett DK, et al., 2019) failed to calculate for the following reasons:   Cannot find a previous HDL lab   Cannot find a previous total cholesterol lab       Relevant Medications   atorvastatin (LIPITOR) 20 MG tablet   Mood disorder (HCC)    Refilled wellbutrin XL, discussed interaction with metoprolol      Relevant Medications   buPROPion (WELLBUTRIN XL) 150 MG 24 hr tablet   Omental infarction (HCC)    By CT - treated with augmentin abx and pain medication. Symptoms largely resolved. Keep gen surg f/u already scheduled next month       Erectile dysfunction    Anticipate vasculogenic, also may be med related.  Desires trial of Viagra. He can start with 50 mg dose, and increase to 100 mg if necessary. The method of use 1 hour prior to anticipated intercourse is explained. He should not use any more than one tablet in a 24 hour period. The side effects of possible headache, flushing, dyspepsia have been explained. Advised to seek urgent medical care if he develops priapism. The patient is not taking nitrates. I have counseled him that taking Viagra with nitrates of any form can cause life threatening drops in blood pressure.  Viagra serum concentrations can be increased by the following: cimetidine, erythromycin, itraconazole or ketoconazole.        Other Visit Diagnoses     Encounter for immunization       Relevant Orders   Flu vaccine trivalent PF, 6mos and older(Flulaval,Afluria,Fluarix,Fluzone) (Completed)        Meds ordered this encounter  Medications   sildenafil (VIAGRA) 100 MG tablet    Sig: Take 0.5-1 tablets (50-100 mg total) by mouth daily as needed for erectile dysfunction.    Dispense:  9 tablet    Refill:  3   atorvastatin (LIPITOR) 20 MG tablet    Sig: Take 1 tablet (20 mg total)  by mouth daily.    Dispense:  90 tablet    Refill:  2    amLODipine (NORVASC) 5 MG tablet    Sig: Take 1 tablet (5 mg total) by mouth daily.    Dispense:  90 tablet    Refill:  2   buPROPion (WELLBUTRIN XL) 150 MG 24 hr tablet    Sig: Take 1 tablet (150 mg total) by mouth daily.    Dispense:  90 tablet    Refill:  2   metoprolol succinate (TOPROL-XL) 25 MG 24 hr tablet    Sig: Take 1 tablet (25 mg total) by mouth daily.    Dispense:  90 tablet    Refill:  2    Orders Placed This Encounter  Procedures   Flu vaccine trivalent PF, 6mos and older(Flulaval,Afluria,Fluarix,Fluzone)   POCT glycosylated hemoglobin (Hb A1C)    Patient Instructions  Flu shot today  Request diabetic eye exam next eye exam.  Try metformin 500mg  once daily with breakfast or dinner. Update me with effect.  Continue current BP medicines. Monitor blood pressures at home, let me know if consistently >140/90 to titrate bp medicines.  Start atorvastatin 20mg  daily for cholesterol control in diabetes.  Trial viagra 50-100mg  once daily as needed for relations.  Return in 3 months for physical (fasting).   Follow up plan: Return in about 3 months (around 06/18/2023), or if symptoms worsen or fail to improve, for annual exam, come fasting.  Eustaquio Boyden, MD

## 2023-03-18 NOTE — Assessment & Plan Note (Signed)
Chronic, diet controlled. Start metformin 500mg  once daily, reviewed side effects to monitor for. Consider GLP1RA in future.

## 2023-03-18 NOTE — Assessment & Plan Note (Signed)
Refilled wellbutrin XL, discussed interaction with metoprolol

## 2023-03-18 NOTE — Assessment & Plan Note (Signed)
Chronic, remaining elevated despite current regimen (recently restarted after he had been off meds for a week due to running out).  I asked him to start monitoring BP at home, keep log (provided today) and update [place] with home readings in 1-2 weeks to titrate antihypertensive accordingly. Consider adding ACEI/ARB in diabetic.

## 2023-03-18 NOTE — Assessment & Plan Note (Signed)
Anticipate vasculogenic, also may be med related.  Desires trial of Viagra. He can start with 50 mg dose, and increase to 100 mg if necessary. The method of use 1 hour prior to anticipated intercourse is explained. He should not use any more than one tablet in a 24 hour period. The side effects of possible headache, flushing, dyspepsia have been explained. Advised to seek urgent medical care if he develops priapism. The patient is not taking nitrates. I have counseled him that taking Viagra with nitrates of any form can cause life threatening drops in blood pressure.  Viagra serum concentrations can be increased by the following: cimetidine, erythromycin, itraconazole or ketoconazole.

## 2023-08-04 ENCOUNTER — Other Ambulatory Visit: Payer: Self-pay | Admitting: Family Medicine

## 2023-08-13 ENCOUNTER — Ambulatory Visit
Admission: EM | Admit: 2023-08-13 | Discharge: 2023-08-13 | Disposition: A | Discharge status: Home / Self Care | Attending: Emergency Medicine | Admitting: Emergency Medicine

## 2023-08-13 ENCOUNTER — Ambulatory Visit: Payer: Self-pay | Admitting: Family Medicine

## 2023-08-13 DIAGNOSIS — L03031 Cellulitis of right toe: Secondary | ICD-10-CM

## 2023-08-13 MED ORDER — SULFAMETHOXAZOLE-TRIMETHOPRIM 800-160 MG PO TABS
1.0000 | ORAL_TABLET | Freq: Two times a day (BID) | ORAL | 0 refills | Status: AC
Start: 2023-08-13 — End: 2023-08-20

## 2023-08-13 NOTE — Discharge Instructions (Addendum)
 [facility] [address] [place], [place] [address] Phone: [phone]   Take the Bactrim as directed.    Follow up with a podiatrist.

## 2023-08-13 NOTE — ED Triage Notes (Signed)
 Patient to Urgent Care with complaints of right sided, ingrown toenail pain. States he cut the ingrown toenail out approx 2 weeks ago. Reports 2-3 days of worsening pain. Purulent drainage around the cuticle. Area very painful and tender.  Has been draining the area at night.

## 2023-08-13 NOTE — ED Provider Notes (Signed)
 Neil Mcintyre    CSN: [id number] Arrival date & time: 08/13/23  1727      History   Chief Complaint Chief Complaint  Patient presents with   Toe Pain    HPI Neil Mcintyre is a 48 y.o. male.  Patient presents with 2-week history of pain, swelling, redness of his right great toe beside his toenail due to an ingrown toenail.  He has previously drained small amounts of pus from the area after he cut out an ingrown toenail 2 weeks ago.  No fever, numbness, weakness.  His medical history includes diabetes.  The history is provided by the patient and medical records.    Past Medical History:  Diagnosis Date   Anxiety    COVID-19 05/10/2021   Depression    Diabetes mellitus without complication (HCC)    Diverticulitis of large intestine with perforation with bleeding 2015, 2017   Laparoscopic sigmoid colectomy 2017   Genital herpes    Hypertension    Irritability and anger    due to stress    Patient Active Problem List   Diagnosis Date Noted   Omental infarction (HCC) 03/18/2023   Erectile dysfunction 03/18/2023   Diverticulitis of colon with bleeding 07/18/2022   Engages in vaping 07/02/2022   Colonic hemorrhage 07/02/2022   Incarcerated incisional hernia 07/02/2022   Vitamin B12 deficiency 06/20/2022   Iron deficiency anemia 06/12/2022   Lower GI bleed 06/09/2022   Left-sided headache 02/25/2022   Seasonal allergic rhinitis 09/07/2019   Enlargement of right palatine tonsil 09/07/2019   Genital herpes    Weak urinary stream 09/04/2017   Mood disorder (HCC) 02/21/2017   Health maintenance examination 10/30/2016   Type 2 diabetes mellitus with other specified complication (HCC) 10/30/2016   Dyslipidemia associated with type 2 diabetes mellitus (HCC) 10/30/2016   Obesity, morbid, BMI 40.0-49.9 (HCC) 07/29/2016   Anxiety 02/21/2015   Primary hypertension 02/21/2015    Past Surgical History:  Procedure Laterality Date   COLON RESECTION  07/2022   robotic  extended left hemicolectomy colon resection with incarcerated incisional hernia repair ([place])   COLONOSCOPY W/ POLYPECTOMY  09/2014   diverticulosis ([place])   COLONOSCOPY WITH PROPOFOL N/A 06/11/2022   2 small polyps not removed, diverticulosis, healthy anastomosis, non-bleeding int hem Timothy Lasso, Viviann Spare)   CYSTOSCOPY W/ RETROGRADES Bilateral 08/16/2015   Hildred Laser, MD   CYSTOSCOPY WITH STENT PLACEMENT Bilateral 08/16/2015   Hildred Laser, MD   LAPAROSCOPIC SIGMOID COLECTOMY N/A 08/16/2015   recurrent diverticulitis; Ricarda Frame, MD   PROCTOSCOPY N/A 07/18/2022   Procedure: RIGID PROCTOSCOPY;  Surgeon: Karie Soda, MD;  Location: [facility];  Service: General;  Laterality: N/A;   REFRACTIVE SURGERY  1999   LASIK       Home Medications    Prior to Admission medications   Medication Sig Start Date End Date Taking? Authorizing Provider  sulfamethoxazole-trimethoprim (BACTRIM DS) 800-160 MG tablet Take 1 tablet by mouth 2 (two) times daily for 7 days. 08/13/23 08/20/23 Yes Mickie Bail, NP  amLODipine (NORVASC) 5 MG tablet Take 1 tablet (5 mg total) by mouth daily. 03/18/23 04/17/23  Eustaquio Boyden, MD  atorvastatin (LIPITOR) 20 MG tablet Take 1 tablet (20 mg total) by mouth daily. 03/18/23   Eustaquio Boyden, MD  buPROPion (WELLBUTRIN XL) 150 MG 24 hr tablet Take 1 tablet (150 mg total) by mouth daily. 03/18/23   Eustaquio Boyden, MD  EXCEDRIN MIGRAINE [phone] MG tablet Take 1-2 tablets by mouth every 6 (six)  hours as needed for headache or migraine.    [provider]  metoprolol succinate (TOPROL-XL) 25 MG 24 hr tablet Take 1 tablet (25 mg total) by mouth daily. 03/18/23   Eustaquio Boyden, MD  MOTRIN IB 200 MG tablet Take 200-400 mg by mouth every 6 (six) hours as needed for mild pain or headache.    [provider]  sildenafil (VIAGRA) 100 MG tablet Take 0.5-1 tablets (50-100 mg total) by mouth daily as needed for erectile dysfunction. 03/18/23    Eustaquio Boyden, MD  valACYclovir (VALTREX) 500 MG tablet TAKE 1 TABLET (500 MG TOTAL) BY MOUTH DAILY. 08/05/23   Eustaquio Boyden, MD    Family History Family History  Adopted: Yes  Family history unknown: Yes    Social History Social History   Tobacco Use   Smoking status: Never   Smokeless tobacco: Former    Types: Chew, Snuff  Vaping Use   Vaping status: Former  Substance Use Topics   Alcohol use: Yes    Alcohol/week: 1.0 standard drink of alcohol    Types: 1 Cans of beer per week    Comment: socially   Drug use: No    Comment: some in HS     Allergies   Patient has no known allergies.   Review of Systems Review of Systems  Constitutional:  Negative for chills and fever.  Musculoskeletal:  Negative for arthralgias, gait problem and joint swelling.  Skin:  Positive for color change and wound.  Neurological:  Negative for weakness and numbness.     Physical Exam Triage Vital Signs ED Triage Vitals  Encounter Vitals Group     BP 08/13/23 1745 135/83     Systolic BP Percentile --      Diastolic BP Percentile --      Pulse Rate 08/13/23 1745 94     Resp 08/13/23 1745 18     Temp 08/13/23 1745 98.2 F (36.8 C)     Temp src --      SpO2 08/13/23 1745 97 %     Weight --      Height --      Head Circumference --      Peak Flow --      Pain Score 08/13/23 1749 3     Pain Loc --      Pain Education --      Exclude from Growth Chart --    No data found.  Updated Vital Signs BP 135/83   Pulse 94   Temp 98.2 F (36.8 C)   Resp 18   SpO2 97%   Visual Acuity Right Eye Distance:   Left Eye Distance:   Bilateral Distance:    Right Eye Near:   Left Eye Near:    Bilateral Near:     Physical Exam Constitutional:      General: He is not in acute distress. HENT:     Mouth/Throat:     Mouth: Mucous membranes are moist.  Cardiovascular:     Rate and Rhythm: Normal rate and regular rhythm.  Pulmonary:     Effort: Pulmonary effort is normal. No  respiratory distress.  Musculoskeletal:        General: No deformity. Normal range of motion.  Skin:    General: Skin is warm and dry.     Capillary Refill: Capillary refill takes less than 2 seconds.     Findings: Erythema present.     Comments: Paronychia of right great toe.  No  indication for I&D at this time as no apparent accumulation of drainage.  Neurological:     General: No focal deficit present.     Mental Status: He is alert.     Sensory: No sensory deficit.     Motor: No weakness.     Gait: Gait normal.      UC Treatments / Results  Labs (all labs ordered are listed, but only abnormal results are displayed) Labs Reviewed - No data to display  EKG   Radiology No results found.  Procedures Procedures (including critical care time)  Medications Ordered in UC Medications - No data to display  Initial Impression / Assessment and Plan / UC Course  I have reviewed the triage vital signs and the nursing notes.  Pertinent labs & imaging results that were available during my care of the patient were reviewed by me and considered in my medical decision making (see chart for details).    Paronychia of right great toe.  According to his chart, patient is diabetic.  Treating with Bactrim.  No indication for I&D at this time.  Instructed patient to follow-up with a podiatrist.  Contact information for [facility] provided.  Education provided on paronychia.  He agrees to plan of care.  Final Clinical Impressions(s) / UC Diagnoses   Final diagnoses:  Paronychia of great toe of right foot     Discharge Instructions      [facility] [address], [place], [place] [address] Phone: [phone]   Take the Bactrim as directed.    Follow up with a podiatrist.         ED Prescriptions     Medication Sig Dispense Auth. Provider   sulfamethoxazole-trimethoprim (BACTRIM DS) 800-160 MG tablet Take 1 tablet by mouth 2 (two) times daily for 7  days. 14 tablet Mickie Bail, NP      PDMP not reviewed this encounter.   Mickie Bail, NP 08/13/23 1820

## 2023-08-13 NOTE — Telephone Encounter (Signed)
  Chief Complaint: toe pain Symptoms: toe pain and drainage Frequency: onset 2 days ago Pertinent Negatives: Patient denies fever, redness Disposition: [] ED /[] Urgent Care (no appt availability in office) / [] Appointment(In office/virtual)/ [x]  [facility]/ [] Home Care/ [x] Refused Recommended Disposition /[] [facility]/ []  Follow-up with PCP Additional Notes:  Removed ingrown toenail couple days ago. Now has pus drainage and very painful, pain "off the chart". Pain 6, with pressure up to 11/10. Office visit advised. Patient is unable to schedule office visit due to work schedule. Patient is requesting early morning or late evening appointment. No pcp availability, checked other offices for early/late appointments but none were acceptable to patient. Patient disconnected due to work emergency. Routing for follow up.   Copied from CRM [id number]. Topic: Clinical - Red Word Triage >> Aug 13, 2023  1:00 PM Jon Gills C wrote: [facility] that prompted transfer to Nurse Triage: Patient called in stating that he is in very bad pain in his toe Reason for Disposition  Yellow pus seen in skin around toenail (cuticle area), or pus seen under toenail  Answer Assessment - Initial Assessment Questions 1. LOCATION: "Which toe?"      Right foot great toe  2. APPEARANCE: "What does it look like?"      Pus drainage 3. ONSET: "When did it start?"      Couple days 4. PAIN: "Is there any pain?" If Yes, ask: "How bad is the pain?"   (Scale 1-10; or mild, moderate, severe)    - NONE (0): none     - MILD (1-3): doesn't interfere with normal activities    - MODERATE (4-7): interferes with normal activities or awakens from sleep    - SEVERE (8-10): excruciating pain, unable to do any normal activities     Severe 5. REDNESS: "Is there any redness of the skin?" If Yes, ask: "How much of the toe is red?"     No  Protocols used: Toenail - Ingrown-A-AH

## 2023-08-13 NOTE — Telephone Encounter (Signed)
 Called patient advised we could schedule for next available or he could go to [facility] today. Patient stated he was unable to speak right now at work and thanked me for the call.

## 2023-08-13 NOTE — Telephone Encounter (Signed)
 I can't see today - would offer appt tomorrow for preferred pt time if available, or Friday with me at 4:30pm however if having severe pain would recommend UCC today.

## 2023-08-19 ENCOUNTER — Encounter: Payer: Self-pay | Admitting: Podiatry

## 2023-08-19 ENCOUNTER — Ambulatory Visit: Admitting: Podiatry

## 2023-08-19 DIAGNOSIS — L600 Ingrowing nail: Secondary | ICD-10-CM

## 2023-08-19 NOTE — Progress Notes (Signed)
   No chief complaint on file.   Subjective: Patient presents today for evaluation of pain to the medial border right great toe. Patient is concerned for possible ingrown nail.  It is very sensitive to touch.  Patient presents today for further treatment and evaluation.  Past Medical History:  Diagnosis Date   Anxiety    COVID-19 05/10/2021   Depression    Diabetes mellitus without complication (HCC)    Diverticulitis of large intestine with perforation with bleeding 2015, 2017   Laparoscopic sigmoid colectomy 2017   Genital herpes    Hypertension    Irritability and anger    due to stress    Past Surgical History:  Procedure Laterality Date   COLON RESECTION  07/2022   robotic extended left hemicolectomy colon resection with incarcerated incisional hernia repair ([place])   COLONOSCOPY W/ POLYPECTOMY  09/2014   diverticulosis ([place])   COLONOSCOPY WITH PROPOFOL N/A 06/11/2022   2 small polyps not removed, diverticulosis, healthy anastomosis, non-bleeding int hem Timothy Lasso, Viviann Spare)   CYSTOSCOPY W/ RETROGRADES Bilateral 08/16/2015   Hildred Laser, MD   CYSTOSCOPY WITH STENT PLACEMENT Bilateral 08/16/2015   Hildred Laser, MD   LAPAROSCOPIC SIGMOID COLECTOMY N/A 08/16/2015   recurrent diverticulitis; Ricarda Frame, MD   PROCTOSCOPY N/A 07/18/2022   Procedure: RIGID PROCTOSCOPY;  Surgeon: Karie Soda, MD;  Location: [facility];  Service: General;  Laterality: N/A;   REFRACTIVE SURGERY  1999   LASIK    No Known Allergies  Objective:  General: Well developed, nourished, in no acute distress, alert and oriented x3   Dermatology: Skin is warm, dry and supple bilateral.  Medial border right great toe is tender with evidence of an ingrowing nail. Pain on palpation noted to the border of the nail fold. The remaining nails appear unremarkable at this time.   Vascular: DP and PT pulses palpable.  No clinical evidence of vascular compromise  Neruologic: Grossly intact via light  touch bilateral.  Musculoskeletal: No pedal deformity noted  Assesement: #1 Paronychia with ingrowing nail medial border right great toe  Plan of Care:  -Patient evaluated.  -Discussed treatment alternatives and plan of care. Explained nail avulsion procedure and post procedure course to patient. -Patient opted for permanent partial nail avulsion of the ingrown portion of the nail.  -Prior to procedure, local anesthesia infiltration utilized using 3 ml of a 50:50 mixture of 2% plain lidocaine and 0.5% plain marcaine in a normal hallux block fashion and a betadine prep performed.  -Partial permanent nail avulsion with chemical matrixectomy performed using 3x30sec applications of phenol followed by alcohol flush.  -Light dressing applied.  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 Felecia Shelling, DPM    [address] [place], [place] [address]                Office [phone]  Fax [phone]

## 2023-09-09 ENCOUNTER — Ambulatory Visit (INDEPENDENT_AMBULATORY_CARE_PROVIDER_SITE_OTHER): Admitting: Podiatry

## 2023-09-09 ENCOUNTER — Encounter: Payer: Self-pay | Admitting: Podiatry

## 2023-09-09 DIAGNOSIS — L600 Ingrowing nail: Secondary | ICD-10-CM

## 2023-09-09 MED ORDER — DOXYCYCLINE HYCLATE 100 MG PO TABS: 100.0000 mg | ORAL_TABLET | Freq: Two times a day (BID) | ORAL | 0 refills | Status: DC

## 2023-09-09 NOTE — Progress Notes (Signed)
   Chief Complaint  Patient presents with   Nail Problem    "It's doing alright."    Subjective: 48 y.o. male presents today status post permanent nail avulsion procedure of the medial border right great toe that was performed on 08/19/2023.  Overall the patient states that he feels significantly better but he does have some continued scant drainage to the area.  Intermittent tenderness as well..   Past Medical History:  Diagnosis Date   Anxiety    COVID-19 05/10/2021   Depression    Diabetes mellitus without complication (HCC)    Diverticulitis of large intestine with perforation with bleeding 2015, 2017   Laparoscopic sigmoid colectomy 2017   Genital herpes    Hypertension    Irritability and anger    due to stress    Objective: Neurovascular status intact.  Skin is warm, dry and supple. Nail and respective nail fold appears to be healing appropriately however there is some scant serous drainage from the nail matricectomy site.  No maceration.  No malodor or purulence.  Assessment: #1 s/p partial permanent nail matrixectomy medial border right great toe   Plan of care: #1 patient was evaluated  #2 light debridement of the periungual debris was performed to the border of the respective toe and nail plate using a tissue nipper. #3  Prescription for doxycycline 100 mg twice daily x 7 days sent to the pharmacy to clear up any low-grade infection within the area  #4 patient is to return to clinic on a PRN basis.  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 Felecia Shelling, DPM    [address] [place], [place] [address]                Office [phone]  Fax [phone]

## 2024-01-11 ENCOUNTER — Ambulatory Visit
Admission: EM | Admit: 2024-01-11 | Discharge: 2024-01-11 | Disposition: A | Discharge status: Home / Self Care | Attending: Emergency Medicine | Admitting: Emergency Medicine

## 2024-01-11 DIAGNOSIS — Z23 Encounter for immunization: Secondary | ICD-10-CM

## 2024-01-11 DIAGNOSIS — S61012A Laceration without foreign body of left thumb without damage to nail, initial encounter: Secondary | ICD-10-CM | POA: Diagnosis not present

## 2024-01-11 MED ORDER — CEPHALEXIN 500 MG PO CAPS: 500.0000 mg | ORAL_CAPSULE | Freq: Three times a day (TID) | ORAL | 0 refills | Status: AC

## 2024-01-11 MED ORDER — TETANUS-DIPHTH-ACELL PERTUSSIS 5-2.5-18.5 LF-MCG/0.5 IM SUSY
0.5000 mL | PREFILLED_SYRINGE | Freq: Once | INTRAMUSCULAR | Status: AC
  Administered 2024-01-11: 0.5 mL via INTRAMUSCULAR

## 2024-01-11 NOTE — ED Triage Notes (Signed)
 Patient to Urgent Care with complaints of a laceration present to his left thumb.  Incident occurred this morning when cutting pepperoni with a butcher knife.   TDAP unknown.

## 2024-01-11 NOTE — Discharge Instructions (Addendum)
 Your tetanus was updated today.    Take the cephalexin  as directed.    Keep your wound clean and dry.  Wash it gently twice a day with soap and water .  Then apply an antibiotic cream and bandage.    Your stitches need to be taken out in 7 to 10 days.  Follow up right away if you see signs of infection, such as redness, pus-like drainage, fever, or other concerning symptoms.

## 2024-01-11 NOTE — ED Provider Notes (Signed)
 [facility]    CSN: [id number] Arrival date & time: 01/11/24  1132      History   Chief Complaint Chief Complaint  Patient presents with   Laceration    HPI Neil Mcintyre is a 48 y.o. male.  Patient presents with a laceration on his left thumb that occurred this morning when he was slicing pepperoni with a butcher knife and accidentally cut his thumb.  Bleeding controlled with direct pressure.  Bandage applied.  No numbness or weakness.  Tetanus unknown.  The history is provided by the patient and medical records.    Past Medical History:  Diagnosis Date   Anxiety    COVID-19 05/10/2021   Depression    Diabetes mellitus without complication (HCC)    Diverticulitis of large intestine with perforation with bleeding 2015, 2017   Laparoscopic sigmoid colectomy 2017   Genital herpes    Hypertension    Irritability and anger    due to stress    Patient Active Problem List   Diagnosis Date Noted   Omental infarction (HCC) 03/18/2023   Erectile dysfunction 03/18/2023   Diverticulitis of colon with bleeding 07/18/2022   Engages in vaping 07/02/2022   Colonic hemorrhage 07/02/2022   Incarcerated incisional hernia 07/02/2022   Vitamin B12 deficiency 06/20/2022   Iron  deficiency anemia 06/12/2022   Lower GI bleed 06/09/2022   Left-sided headache 02/25/2022   Seasonal allergic rhinitis 09/07/2019   Enlargement of right palatine tonsil 09/07/2019   Genital herpes    Weak urinary stream 09/04/2017   Mood disorder (HCC) 02/21/2017   Health maintenance examination 10/30/2016   Type 2 diabetes mellitus with other specified complication (HCC) 10/30/2016   Dyslipidemia associated with type 2 diabetes mellitus (HCC) 10/30/2016   Obesity, morbid, BMI 40.0-49.9 (HCC) 07/29/2016   Anxiety 02/21/2015   Primary hypertension 02/21/2015    Past Surgical History:  Procedure Laterality Date   COLON RESECTION  07/2022   robotic extended left hemicolectomy colon resection  with incarcerated incisional hernia repair ([place])   COLONOSCOPY W/ POLYPECTOMY  09/2014   diverticulosis ([place])   COLONOSCOPY WITH PROPOFOL  N/A 06/11/2022   2 small polyps not removed, diverticulosis, healthy anastomosis, non-bleeding int hem Gaither, Elspeth)   CYSTOSCOPY W/ RETROGRADES Bilateral 08/16/2015   Redell Lynwood Napoleon, MD   CYSTOSCOPY WITH STENT PLACEMENT Bilateral 08/16/2015   Redell Lynwood Napoleon, MD   LAPAROSCOPIC SIGMOID COLECTOMY N/A 08/16/2015   recurrent diverticulitis; Carlin Pastel, MD   PROCTOSCOPY N/A 07/18/2022   Procedure: RIGID PROCTOSCOPY;  Surgeon: Sheldon Elspeth, MD;  Location: [facility];  Service: General;  Laterality: N/A;   REFRACTIVE SURGERY  1999   LASIK       Home Medications    Prior to Admission medications   Medication Sig Start Date End Date Taking? Authorizing Provider  cephALEXin  (KEFLEX ) 500 MG capsule Take 1 capsule (500 mg total) by mouth 3 (three) times daily for 5 days. 01/11/24 01/16/24 Yes Corlis Burnard DEL, NP  amLODipine  (NORVASC ) 5 MG tablet Take 1 tablet (5 mg total) by mouth daily. 03/18/23 04/17/23  Rilla Baller, MD  atorvastatin  (LIPITOR) 20 MG tablet Take 1 tablet (20 mg total) by mouth daily. 03/18/23   Rilla Baller, MD  buPROPion  (WELLBUTRIN  XL) 150 MG 24 hr tablet Take 1 tablet (150 mg total) by mouth daily. 03/18/23   Rilla Baller, MD  doxycycline  (VIBRA -TABS) 100 MG tablet Take 1 tablet (100 mg total) by mouth 2 (two) times daily. Patient not taking: Reported on 01/11/2024  09/09/23   Janit Thresa HERO, DPM  EXCEDRIN MIGRAINE 250-250-65 MG tablet Take 1-2 tablets by mouth every 6 (six) hours as needed for headache or migraine.    [provider]  metoprolol  succinate (TOPROL -XL) 25 MG 24 hr tablet Take 1 tablet (25 mg total) by mouth daily. 03/18/23   Rilla Baller, MD  MOTRIN  IB 200 MG tablet Take 200-400 mg by mouth every 6 (six) hours as needed for mild pain or headache.    [provider]  sildenafil   (VIAGRA ) 100 MG tablet Take 0.5-1 tablets (50-100 mg total) by mouth daily as needed for erectile dysfunction. 03/18/23   Rilla Baller, MD  valACYclovir  (VALTREX ) 500 MG tablet TAKE 1 TABLET (500 MG TOTAL) BY MOUTH DAILY. 08/05/23   Rilla Baller, MD    Family History Family History  Adopted: Yes  Family history unknown: Yes    Social History Social History   Tobacco Use   Smoking status: Never   Smokeless tobacco: Former    Types: Chew, Snuff  Vaping Use   Vaping status: Former  Substance Use Topics   Alcohol use: Not Currently    Alcohol/week: 1.0 standard drink of alcohol    Types: 1 Cans of beer per week    Comment: socially   Drug use: No    Comment: some in HS     Allergies   Patient has no known allergies.   Review of Systems Review of Systems  Constitutional:  Negative for chills and fever.  Musculoskeletal:  Negative for arthralgias and joint swelling.  Skin:  Positive for wound. Negative for color change.  Neurological:  Negative for weakness and numbness.     Physical Exam Triage Vital Signs ED Triage Vitals [01/11/24 1144]  Encounter Vitals Group     BP      Girls Systolic BP Percentile      Girls Diastolic BP Percentile      Boys Systolic BP Percentile      Boys Diastolic BP Percentile      Pulse Rate (!) 102     Resp 18     Temp 97.9 F (36.6 C)     Temp src      SpO2 97 %     Weight      Height      Head Circumference      Peak Flow      Pain Score      Pain Loc      Pain Education      Exclude from Growth Chart    No data found.  Updated Vital Signs BP (!) 155/90   Pulse (!) 102   Temp 97.9 F (36.6 C)   Resp 18   SpO2 97%   Visual Acuity Right Eye Distance:   Left Eye Distance:   Bilateral Distance:    Right Eye Near:   Left Eye Near:    Bilateral Near:     Physical Exam Constitutional:      General: He is not in acute distress. HENT:     Mouth/Throat:     Mouth: Mucous membranes are moist.   Cardiovascular:     Rate and Rhythm: Normal rate and regular rhythm.  Pulmonary:     Effort: Pulmonary effort is normal. No respiratory distress.  Musculoskeletal:        General: No deformity. Normal range of motion.  Skin:    General: Skin is warm and dry.     Capillary Refill: Capillary refill  takes less than 2 seconds.     Findings: Lesion present.     Comments: 2 cm laceration of left thumb. Bleeding controlled.   Neurological:     General: No focal deficit present.     Mental Status: He is alert.     Sensory: No sensory deficit.     Motor: No weakness.      UC Treatments / Results  Labs (all labs ordered are listed, but only abnormal results are displayed) Labs Reviewed - No data to display  EKG   Radiology No results found.  Procedures Laceration Repair  Date/Time: 01/11/2024 12:30 PM  Performed by: Corlis Burnard DEL, NP Authorized by: Corlis Burnard DEL, NP   Consent:    Consent obtained:  Verbal   Consent given by:  Patient   Risks discussed:  Infection, pain, poor cosmetic result and poor wound healing Universal protocol:    Procedure explained and questions answered to patient or proxy's satisfaction: yes   Anesthesia:    Anesthesia method:  Local infiltration   Local anesthetic:  Lidocaine  1% w/o epi Laceration details:    Location:  Finger   Finger location:  L thumb   Length (cm):  2   Depth (mm):  2 Pre-procedure details:    Preparation:  Patient was prepped and draped in usual sterile fashion Exploration:    Hemostasis achieved with:  Direct pressure   Imaging outcome: foreign body not noted     Wound exploration: wound explored through full range of motion and entire depth of wound visualized   Treatment:    Area cleansed with:  Povidone-iodine   Amount of cleaning:  Standard   Irrigation solution:  Sterile water    Irrigation method:  Syringe   Visualized foreign bodies/material removed: no   Skin repair:    Repair method:  Sutures   Suture  size:  4-0   Suture material:  Nylon   Suture technique:  Simple interrupted   Number of sutures:  4 Approximation:    Approximation:  Close Repair type:    Repair type:  Simple Post-procedure details:    Dressing:  Antibiotic ointment and non-adherent dressing   Procedure completion:  Tolerated well, no immediate complications  (including critical care time)  Medications Ordered in UC Medications  Tdap (BOOSTRIX ) injection 0.5 mL (0.5 mLs Intramuscular Given 01/11/24 1229)    Initial Impression / Assessment and Plan / UC Course  I have reviewed the triage vital signs and the nursing notes.  Pertinent labs & imaging results that were available during my care of the patient were reviewed by me and considered in my medical decision making (see chart for details).    Laceration of left thumb.  4 sutures.  Tetanus updated.  Prophylactically treating today with 5-day course of cephalexin .  Wound care instructions and signs of infection discussed.  Education provided on laceration care.  Instructed patient to return here for suture removal in 7 to 10 days.  Instructed him to return right away if he notes signs of infection.  He agrees to plan of care.  Final Clinical Impressions(s) / UC Diagnoses   Final diagnoses:  Laceration of left thumb without foreign body without damage to nail, initial encounter     Discharge Instructions      Your tetanus was updated today.    Take the cephalexin  as directed.    Keep your wound clean and dry.  Wash it gently twice a day with soap and water .  Then  apply an antibiotic cream and bandage.    Your stitches need to be taken out in 7 to 10 days.  Follow up right away if you see signs of infection, such as redness, pus-like drainage, fever, or other concerning symptoms.        ED Prescriptions     Medication Sig Dispense Auth. Provider   cephALEXin  (KEFLEX ) 500 MG capsule Take 1 capsule (500 mg total) by mouth 3 (three) times daily for 5  days. 15 capsule Corlis Burnard DEL, NP      PDMP not reviewed this encounter.   Corlis Burnard DEL, NP 01/11/24 [phone]

## 2024-01-18 ENCOUNTER — Encounter: Payer: Self-pay | Admitting: Emergency Medicine

## 2024-01-18 ENCOUNTER — Ambulatory Visit: Admission: EM | Admit: 2024-01-18 | Discharge: 2024-01-18 | Disposition: A | Discharge status: Home / Self Care

## 2024-01-18 DIAGNOSIS — Z4802 Encounter for removal of sutures: Secondary | ICD-10-CM | POA: Diagnosis not present

## 2024-01-18 NOTE — ED Triage Notes (Signed)
 Patient here for sutures removal . Patient had for 4 sutures ine fell out so 3 sutures removed. Vices no others complaintd.

## 2024-02-05 ENCOUNTER — Encounter (HOSPITAL_COMMUNITY): Payer: Self-pay

## 2024-02-05 ENCOUNTER — Other Ambulatory Visit: Payer: Self-pay

## 2024-02-05 ENCOUNTER — Emergency Department (HOSPITAL_COMMUNITY)
Admission: EM | Admit: 2024-02-05 | Discharge: 2024-02-05 | Disposition: A | Discharge status: Home / Self Care | Attending: Emergency Medicine | Admitting: Emergency Medicine

## 2024-02-05 ENCOUNTER — Emergency Department (HOSPITAL_COMMUNITY)

## 2024-02-05 DIAGNOSIS — I10 Essential (primary) hypertension: Secondary | ICD-10-CM | POA: Diagnosis not present

## 2024-02-05 DIAGNOSIS — R1011 Right upper quadrant pain: Secondary | ICD-10-CM | POA: Diagnosis present

## 2024-02-05 DIAGNOSIS — E119 Type 2 diabetes mellitus without complications: Secondary | ICD-10-CM | POA: Diagnosis not present

## 2024-02-05 DIAGNOSIS — Z8616 Personal history of COVID-19: Secondary | ICD-10-CM | POA: Diagnosis not present

## 2024-02-05 DIAGNOSIS — Z87891 Personal history of nicotine dependence: Secondary | ICD-10-CM | POA: Insufficient documentation

## 2024-02-05 DIAGNOSIS — Z79899 Other long term (current) drug therapy: Secondary | ICD-10-CM | POA: Insufficient documentation

## 2024-02-05 DIAGNOSIS — K55069 Acute infarction of intestine, part and extent unspecified: Secondary | ICD-10-CM

## 2024-02-05 LAB — LIPASE, BLOOD: Lipase: 25 U/L (ref 11–51)

## 2024-02-05 LAB — COMPREHENSIVE METABOLIC PANEL WITH GFR
ALT: 18 U/L (ref 0–44)
AST: 14 U/L — ABNORMAL LOW (ref 15–41)
Albumin: 3.7 g/dL (ref 3.5–5.0)
Alkaline Phosphatase: 90 U/L (ref 38–126)
Anion gap: 11 (ref 5–15)
BUN: 12 mg/dL (ref 6–20)
CO2: 24 mmol/L (ref 22–32)
Calcium: 9.2 mg/dL (ref 8.9–10.3)
Chloride: 101 mmol/L (ref 98–111)
Creatinine, Ser: 0.75 mg/dL (ref 0.61–1.24)
GFR, Estimated: >60 mL/min (ref >60)
Glucose, Bld: 112 mg/dL — ABNORMAL HIGH (ref 70–99)
Potassium: 3.9 mmol/L (ref 3.5–5.1)
Sodium: 136 mmol/L (ref 135–145)
Total Bilirubin: 1.6 mg/dL — ABNORMAL HIGH (ref 0.0–1.2)
Total Protein: 6.6 g/dL (ref 6.5–8.1)

## 2024-02-05 LAB — CBC
HCT: 50.3 % (ref 39.0–52.0)
Hemoglobin: 15.9 g/dL (ref 13.0–17.0)
MCH: 28.3 pg (ref 26.0–34.0)
MCHC: 31.6 g/dL (ref 30.0–36.0)
MCV: 89.7 fL (ref 80.0–100.0)
Platelets: 258 K/uL (ref 150–400)
RBC: 5.61 MIL/uL (ref 4.22–5.81)
RDW: 14.7 % (ref 11.5–15.5)
WBC: 15.6 K/uL — ABNORMAL HIGH (ref 4.0–10.5)
nRBC: 0 % (ref 0.0–0.2)

## 2024-02-05 LAB — URINALYSIS, ROUTINE W REFLEX MICROSCOPIC
Bilirubin Urine: NEGATIVE
Glucose, UA: NEGATIVE mg/dL
Hgb urine dipstick: NEGATIVE
Ketones, ur: 5 mg/dL — AB
Leukocytes,Ua: NEGATIVE
Nitrite: NEGATIVE
Protein, ur: NEGATIVE mg/dL
Specific Gravity, Urine: 1.025 (ref 1.005–1.030)
pH: 5 (ref 5.0–8.0)

## 2024-02-05 MED ORDER — IOHEXOL 300 MG/ML  SOLN
100.0000 mL | Freq: Once | INTRAMUSCULAR | Status: AC | PRN
  Administered 2024-02-05: 100 mL via INTRAVENOUS

## 2024-02-05 MED ORDER — OXYCODONE-ACETAMINOPHEN 5-325 MG PO TABS: 1.0000 | ORAL_TABLET | ORAL | 0 refills | Status: DC | PRN

## 2024-02-05 MED ORDER — MORPHINE SULFATE (PF) 4 MG/ML IV SOLN
6.0000 mg | Freq: Once | INTRAVENOUS | Status: AC
  Administered 2024-02-05: 6 mg via INTRAVENOUS
  Filled 2024-02-05: qty 2

## 2024-02-05 NOTE — Discharge Instructions (Signed)
 Omental infarction is a rare cause of abdominal pain that usually gets better on its own with rest and supportive care. Most people recover without surgery or other procedures if the diagnosis is confirmed by imaging and there are no complications.  What to do at home:   Rest: Take it easy and avoid strenuous activity until your pain improves.  Hydration: Drink plenty of fluids, such as water  or clear liquids, to stay well hydrated. This helps your body recover and prevents dehydration, especially if you have had nausea or vomiting.  Pain control: Oxycodone  5 mg orally every 4-6 hours as needed  Diet: Eat small, light meals as tolerated. If you feel nauseated, stick to bland foods and clear liquids until your appetite returns.  When to seek medical attention:  Return to the emergency department or contact your doctor right away if you have:  Persistent or worsening nausea or vomiting that prevents you from keeping fluids down  Severe or increasing abdominal pain that does not improve with rest and pain medicine  Fever (temperature above 100.29F or 38C)  New symptoms such as chills, confusion, or trouble breathing These symptoms could mean that your condition is getting worse or that you have developed a complication that needs prompt medical attention. Most people with omental infarction recover fully with conservative care. Follow up with your healthcare provider as instructed for any recommended check-ups or imaging.   If you have any questions or concerns, do not hesitate to contact your healthcare team.

## 2024-02-05 NOTE — ED Provider Notes (Signed)
 I saw and evaluated the patient, reviewed the resident's note and I agree with the findings and plan.      Patient here complaining of abdominal pain which started yesterday.  History of bowel leakage in the past.  On exam, he has tenderness to his epigastric area.  Labs and abdominal CT pending at this time   Dasie Faden, MD 02/05/24 [phone]

## 2024-02-05 NOTE — ED Notes (Signed)
 Patient transported to CT

## 2024-02-05 NOTE — ED Triage Notes (Signed)
 Pt c/o upper abdominal pain that started yesterday. Pt states he had part of his large intestine removed a few years ago and had leakage issues and this feels like that now. Denies any n/v/d.

## 2024-02-05 NOTE — ED Provider Notes (Signed)
 [place] EMERGENCY DEPARTMENT AT [facility] Provider Note  CSN: [id number] Arrival date & time: 02/05/24 1805  Chief Complaint(s) Abdominal Pain  HPI Neil Mcintyre is a 47 y.o. male with past medical history of diverticulitis with prior resolvent and colon resection, omental infarction, colonic hemorrhage came to the ER with chief complaint of right upper quadrant abdominal pain, worse with movement and deep inspiration.  Pain is localized, producible with deep palpation and has been progressively worsening since yesterday.  He denies nausea vomiting, diarrhea, urinary symptoms but reports irregular bowel habits since his surgeries (colon resection).  No fever or systemic symptoms are reported.   Past Medical History Past Medical History:  Diagnosis Date   Anxiety    COVID-19 05/10/2021   Depression    Diabetes mellitus without complication (HCC)    Diverticulitis of large intestine with perforation with bleeding 2015, 2017   Laparoscopic sigmoid colectomy 2017   Genital herpes    Hypertension    Irritability and anger    due to stress   Patient Active Problem List   Diagnosis Date Noted   Omental infarction (HCC) 03/18/2023   Erectile dysfunction 03/18/2023   Diverticulitis of colon with bleeding 07/18/2022   Engages in vaping 07/02/2022   Colonic hemorrhage 07/02/2022   Incarcerated incisional hernia 07/02/2022   Vitamin B12 deficiency 06/20/2022   Iron  deficiency anemia 06/12/2022   Lower GI bleed 06/09/2022   Left-sided headache 02/25/2022   Seasonal allergic rhinitis 09/07/2019   Enlargement of right palatine tonsil 09/07/2019   Genital herpes    Weak urinary stream 09/04/2017   Mood disorder (HCC) 02/21/2017   Health maintenance examination 10/30/2016   Type 2 diabetes mellitus with other specified complication (HCC) 10/30/2016   Dyslipidemia associated with type 2 diabetes mellitus (HCC) 10/30/2016   Obesity, morbid, BMI 40.0-49.9 (HCC) 07/29/2016    Anxiety 02/21/2015   Primary hypertension 02/21/2015   Home Medication(s) Prior to Admission medications   Medication Sig Start Date End Date Taking? Authorizing Provider  amLODipine  (NORVASC ) 5 MG tablet Take 1 tablet (5 mg total) by mouth daily. 03/18/23 04/17/23  Rilla Baller, MD  atorvastatin  (LIPITOR) 20 MG tablet Take 1 tablet (20 mg total) by mouth daily. 03/18/23   Rilla Baller, MD  buPROPion  (WELLBUTRIN  XL) 150 MG 24 hr tablet Take 1 tablet (150 mg total) by mouth daily. 03/18/23   Rilla Baller, MD  doxycycline  (VIBRA -TABS) 100 MG tablet Take 1 tablet (100 mg total) by mouth 2 (two) times daily. Patient not taking: Reported on 01/11/2024 09/09/23   Janit Thresa HERO, DPM  EXCEDRIN MIGRAINE 250-250-65 MG tablet Take 1-2 tablets by mouth every 6 (six) hours as needed for headache or migraine.    [provider]  metoprolol  succinate (TOPROL -XL) 25 MG 24 hr tablet Take 1 tablet (25 mg total) by mouth daily. 03/18/23   Rilla Baller, MD  MOTRIN  IB 200 MG tablet Take 200-400 mg by mouth every 6 (six) hours as needed for mild pain or headache.    [provider]  sildenafil  (VIAGRA ) 100 MG tablet Take 0.5-1 tablets (50-100 mg total) by mouth daily as needed for erectile dysfunction. 03/18/23   Rilla Baller, MD  valACYclovir  (VALTREX ) 500 MG tablet TAKE 1 TABLET (500 MG TOTAL) BY MOUTH DAILY. 08/05/23   Rilla Baller, MD  Past Surgical History Past Surgical History:  Procedure Laterality Date   COLON RESECTION  07/2022   robotic extended left hemicolectomy colon resection with incarcerated incisional hernia repair ([place])   COLONOSCOPY W/ POLYPECTOMY  09/2014   diverticulosis ([place])   COLONOSCOPY WITH PROPOFOL  N/A 06/11/2022   2 small polyps not removed, diverticulosis, healthy anastomosis, non-bleeding int hem [place], Elspeth)    CYSTOSCOPY W/ RETROGRADES Bilateral 08/16/2015   Redell Lynwood Napoleon, MD   CYSTOSCOPY WITH STENT PLACEMENT Bilateral 08/16/2015   Redell Lynwood Napoleon, MD   LAPAROSCOPIC SIGMOID COLECTOMY N/A 08/16/2015   recurrent diverticulitis; Carlin Pastel, MD   PROCTOSCOPY N/A 07/18/2022   Procedure: RIGID PROCTOSCOPY;  Surgeon: Sheldon Elspeth, MD;  Location: [facility];  Service: General;  Laterality: N/A;   REFRACTIVE SURGERY  1999   LASIK   Family History Family History  Adopted: Yes  Family history unknown: Yes    Social History Social History   Tobacco Use   Smoking status: Never   Smokeless tobacco: Former    Types: Chew, Snuff  Vaping Use   Vaping status: Former  Substance Use Topics   Alcohol use: Not Currently    Alcohol/week: 1.0 standard drink of alcohol    Types: 1 Cans of beer per week    Comment: socially   Drug use: No    Comment: some in HS   Allergies Patient has no known allergies.  Review of Systems A thorough review of systems was obtained and all systems are negative except as noted in the HPI and PMH.   Physical Exam Vital Signs  I have reviewed the triage vital signs BP (!) 183/92   Pulse 99   Temp 99.4 F (37.4 C) (Oral)   Resp 18   SpO2 99%  General: Alert, no acute distress.  HEENT: Soft, supple, with full range of motion.  Cardiac: Regular rate and rhythm, normal S1 and S2 Lungs: Clear to auscultation bilaterally in all fields, with no wheezes or crackles appreciated.  Abdomen: Localized abdominal pain in the right upper quadrant, moderate tenderness on palpation (RUQ) Back: No CVA tenderness. Extremities: Warm and well-perfused, with no cyanosis, clubbing, or edema. 2+ pulses in all four distal extremities. Neurological: Alert and oriented 4. No focal deficits. Skin: No rashes noted.   ED Results and Treatments Labs (all labs ordered are listed, but only abnormal results are displayed) Labs Reviewed  COMPREHENSIVE METABOLIC PANEL WITH GFR  - Abnormal; Notable for the following components:      Result Value   Glucose, Bld 112 (*)    AST 14 (*)    Total Bilirubin 1.6 (*)    All other components within normal limits  CBC - Abnormal; Notable for the following components:   WBC 15.6 (*)    All other components within normal limits  URINALYSIS, ROUTINE W REFLEX MICROSCOPIC - Abnormal; Notable for the following components:   Ketones, ur 5 (*)    All other components within normal limits  LIPASE, BLOOD  Radiology CT ABDOMEN PELVIS W CONTRAST Result Date: 02/05/2024 CLINICAL DATA:  Abdominal pain. EXAM: CT ABDOMEN AND PELVIS WITH CONTRAST TECHNIQUE: Multidetector CT imaging of the abdomen and pelvis was performed using the standard protocol following bolus administration of intravenous contrast. RADIATION DOSE REDUCTION: This exam was performed according to the departmental dose-optimization program which includes automated exposure control, adjustment of the mA and/or kV according to patient size and/or use of iterative reconstruction technique. CONTRAST:  OMNIPAQUE  IOHEXOL  300 MG/ML  SOLN COMPARISON:  CT dated 03/09/2023. FINDINGS: Lower chest: Minimal bibasilar atelectasis. Partial right middle lobe consolidation with air bronchogram likely atelectasis. Pneumonia is not excluded. No intra-abdominal free air.  Trace perihepatic fluid. Hepatobiliary: Probable fatty liver. No biliary dilatation. The gallbladder is unremarkable. Pancreas: Unremarkable. No pancreatic ductal dilatation or surrounding inflammatory changes. Spleen: Normal in size without focal abnormality. Adrenals/Urinary Tract: The adrenal glands unremarkable. There is no hydronephrosis on either side. The visualized ureters appear unremarkable. The urinary bladder is collapsed. Stomach/Bowel: Postsurgical changes of sigmoid colon with anastomotic staple  line. There is no bowel obstruction or active inflammation. The appendix is normal. Vascular/Lymphatic: The abdominal aorta and IVC unremarkable. No portal venous gas. There is no adenopathy. Reproductive: The prostate and seminal vesicles are grossly unremarkable. Other: Small areas of old infarct in the omentum in the left upper abdomen also present on the prior CT. There has been interval development of an area of fat stranding and inflammation anterior to the left lobe of the liver and above the hepatic flexure consistent with new omental infarct. No drainable fluid collection. Musculoskeletal: No acute or significant osseous findings. IMPRESSION: 1. New omental infarct anterior to the left lobe of the liver and about the hepatic flexure. No drainable fluid collection. 2. No bowel obstruction. Normal appendix. 3. Partial right middle lobe consolidation with air bronchogram likely atelectasis. Pneumonia is not excluded. Electronically Signed   By: Vanetta Chou M.D.   On: 02/05/2024 21:23    Pertinent labs & imaging results that were available during my care of the patient were reviewed by me and considered in my medical decision making (see MDM for details).  Medications Ordered in ED Medications  iohexol  (OMNIPAQUE ) 300 MG/ML solution 100 mL (100 mLs Intravenous Contrast Given 02/05/24 2059)  morphine  (PF) 4 MG/ML injection 6 mg (6 mg Intravenous Given 02/05/24 2131)                                                                                                                                     Procedures Procedures  (including critical care time)  Medical Decision Making / ED Course    Medical Decision Making:   JATAVIUS ELLENWOOD is a 48 y.o. male with past medical history of diverticulitis with prior resolvent and colon resection, omental infarction, colonic hemorrhage came to the ER with chief complaint of right upper quadrant abdominal pain, worse with movement and deep inspiration.  Differential includes biliary colic, cholecystitis, omental infarction, hepatic pathology, postsurgical adhesion or GERD related pain.  Given prior polyp surgery, careful evaluation for postoperative complications or obstructive process is needed.  Plan: - CBC, CMP, lipase - Abdominal pelvis CT scan with contrast:  . New omental infarct anterior to the left lobe of the liver and  about the hepatic flexure. No drainable fluid collection.  2. No bowel obstruction. Normal appendix.  3. Partial right middle lobe consolidation with air bronchogram  likely atelectasis. Pneumonia is not excluded.  - morphine  for pain management - DC the patient with instruction and Oxycodone    Additional history obtained: -Additional history obtained from spouse -External records from outside source obtained and reviewed including: Chart review including previous notes, labs, imaging, consultation notes including    Lab Tests: -I ordered, reviewed, and interpreted labs.   The pertinent results include:   Labs Reviewed  COMPREHENSIVE METABOLIC PANEL WITH GFR - Abnormal; Notable for the following components:      Result Value   Glucose, Bld 112 (*)    AST 14 (*)    Total Bilirubin 1.6 (*)    All other components within normal limits  CBC - Abnormal; Notable for the following components:   WBC 15.6 (*)    All other components within normal limits  URINALYSIS, ROUTINE W REFLEX MICROSCOPIC - Abnormal; Notable for the following components:   Ketones, ur 5 (*)    All other components within normal limits  LIPASE, BLOOD    Notable for WBC: 18  EKG   EKG Interpretation Date/Time:    Ventricular Rate:    PR Interval:    QRS Duration:    QT Interval:    QTC Calculation:   R Axis:      Text Interpretation:           Imaging Studies ordered: I ordered imaging studies including Abd/Pelvis CT:   New omental infarct anterior to the left lobe of the liver and  about the hepatic flexure. No  drainable fluid collection.   I independently visualized the following imaging with scope of interpretation limited to determining acute life threatening conditions related to emergency care; findings noted above I agree with the radiologist interpretation If any imaging was obtained with contrast I closely monitored patient for any possible adverse reaction a/w contrast administration in the emergency department   Medicines ordered and prescription drug management: Meds ordered this encounter  Medications   iohexol  (OMNIPAQUE ) 300 MG/ML solution 100 mL   morphine  (PF) 4 MG/ML injection 6 mg    -I have reviewed the patients home medicines and have made adjustments as needed   Consultations Obtained: I requested consultation with the surgery,  and discussed lab and imaging findings as well as pertinent plan - they recommend: patient does not need admission    Cardiac Monitoring: The patient was maintained on a cardiac monitor.  I personally viewed and interpreted the cardiac monitored which showed an underlying rhythm of: normal sinus rhythm Continuous pulse oximetry interpreted by myself, 99% on room air.     Reevaluation: After the interventions noted above, I reevaluated the patient and found that they have improved  Co morbidities that complicate the patient evaluation  Past Medical History:  Diagnosis Date   Anxiety    COVID-19 05/10/2021   Depression    Diabetes mellitus without complication (HCC)    Diverticulitis of large intestine with perforation with bleeding 2015, 2017   Laparoscopic sigmoid colectomy 2017   Genital  herpes    Hypertension    Irritability and anger    due to stress      Dispostion: Disposition decision including need for hospitalization was considered, and patient discharged from emergency department.    Final Clinical Impression(s) / ED Diagnoses Final diagnoses:  None        Bernadine Manos, MD 02/05/24 2202    Dasie Faden, MD 02/06/24 2252

## 2024-02-06 ENCOUNTER — Telehealth (HOSPITAL_COMMUNITY): Payer: Self-pay

## 2024-02-06 ENCOUNTER — Telehealth: Payer: Self-pay

## 2024-02-06 MED ORDER — OXYCODONE-ACETAMINOPHEN 5-325 MG PO TABS
1.0000 | ORAL_TABLET | Freq: Four times a day (QID) | ORAL | 0 refills | Status: AC | PRN
Start: 2024-02-06 — End: 2024-02-13

## 2024-02-06 NOTE — Telephone Encounter (Signed)
 Patient called in to say the [facility] where [facility] said they could not get it through insurance, the paitent called his insurance company and they satted that [facility] [address] [place]. Messaged with provider DELENA Medicus, he [facility] it [facility] [address] [facility]. Patient is aware that it has been sent

## 2024-02-06 NOTE — Telephone Encounter (Signed)
 I was asked to resend the patient's Percocet prescription to his pharmacy who presented yesterday with omental infarction as the pharmacy did not receive this.  Will send prescription.

## 2024-02-14 ENCOUNTER — Other Ambulatory Visit: Payer: Self-pay | Admitting: Family Medicine

## 2024-02-14 DIAGNOSIS — F39 Unspecified mood [affective] disorder: Secondary | ICD-10-CM

## 2024-02-16 MED ORDER — METOPROLOL SUCCINATE ER 25 MG PO TB24: 25.0000 mg | ORAL_TABLET | Freq: Every day | ORAL | 0 refills | Status: DC

## 2024-02-16 MED ORDER — BUPROPION HCL ER (XL) 150 MG PO TB24
150.0000 mg | ORAL_TABLET | Freq: Every day | ORAL | 0 refills | Status: DC
Start: 2024-02-16 — End: 2024-03-17

## 2024-02-16 MED ORDER — ATORVASTATIN CALCIUM 20 MG PO TABS: 20.0000 mg | ORAL_TABLET | Freq: Every day | ORAL | 0 refills | Status: DC

## 2024-02-16 NOTE — Telephone Encounter (Signed)
 Called pt and pt stated that he will call office when he get home

## 2024-02-16 NOTE — Telephone Encounter (Signed)
 Please schedule CPE or OV for more refills.

## 2024-03-17 ENCOUNTER — Ambulatory Visit: Admitting: Family Medicine

## 2024-03-17 ENCOUNTER — Encounter: Payer: Self-pay | Admitting: Family Medicine

## 2024-03-17 VITALS — BP 144/90 | HR 91 | Temp 99.0°F | Ht 67.0 in | Wt 295.1 lb

## 2024-03-17 DIAGNOSIS — I10 Essential (primary) hypertension: Secondary | ICD-10-CM | POA: Diagnosis not present

## 2024-03-17 DIAGNOSIS — E1169 Type 2 diabetes mellitus with other specified complication: Secondary | ICD-10-CM

## 2024-03-17 DIAGNOSIS — K635 Polyp of colon: Secondary | ICD-10-CM

## 2024-03-17 DIAGNOSIS — H5789 Other specified disorders of eye and adnexa: Secondary | ICD-10-CM

## 2024-03-17 DIAGNOSIS — K55069 Acute infarction of intestine, part and extent unspecified: Secondary | ICD-10-CM

## 2024-03-17 DIAGNOSIS — F39 Unspecified mood [affective] disorder: Secondary | ICD-10-CM | POA: Diagnosis not present

## 2024-03-17 DIAGNOSIS — R0683 Snoring: Secondary | ICD-10-CM

## 2024-03-17 MED ORDER — TRULICITY 1.5 MG/0.5ML ~~LOC~~ SOAJ: 1.5000 mg | SUBCUTANEOUS | 3 refills | Status: AC

## 2024-03-17 MED ORDER — ATORVASTATIN CALCIUM 20 MG PO TABS: 20.0000 mg | ORAL_TABLET | Freq: Every day | ORAL | 3 refills | Status: AC

## 2024-03-17 MED ORDER — BUPROPION HCL ER (XL) 150 MG PO TB24: 150.0000 mg | ORAL_TABLET | Freq: Every day | ORAL | 3 refills | Status: AC

## 2024-03-17 MED ORDER — TRULICITY 0.75 MG/0.5ML ~~LOC~~ SOAJ: 0.7500 mg | SUBCUTANEOUS | 0 refills | Status: AC

## 2024-03-17 MED ORDER — VALACYCLOVIR HCL 500 MG PO TABS: 500.0000 mg | ORAL_TABLET | Freq: Every day | ORAL | 3 refills | Status: AC

## 2024-03-17 MED ORDER — AMLODIPINE BESYLATE 5 MG PO TABS: 5.0000 mg | ORAL_TABLET | Freq: Every day | ORAL | 3 refills | Status: AC

## 2024-03-17 MED ORDER — METOPROLOL SUCCINATE ER 25 MG PO TB24: 25.0000 mg | ORAL_TABLET | Freq: Every day | ORAL | 3 refills | Status: AC

## 2024-03-17 NOTE — Patient Instructions (Addendum)
 I will refer you to [facility] clinic GI in [place] to discuss repeat colonoscopy.   BP was too high - start monitoring at home with BP log sheet provided today Your goal blood pressure is <140/90. Work on low salt/sodium diet - goal <2 grams (2,000mg ) per day. Eat a diet high in fruits/vegetables and whole grains. Look into mediterranean and DASH diet. Goal activity is 167min/wk of moderate intensity exercise.  This can be split into 30 minute chunks.  If you are not at this level, you can start with smaller 10-15 min increments and slowly build up activity. Look at www.heart.org for more resources   For weight loss -check with insurance on coverage for Wegovy, Zepbound or Saxenda for weight loss.  Return in 3 months for physical. Let me know sooner if BP staying consistently >140/90

## 2024-03-17 NOTE — Assessment & Plan Note (Addendum)
 Chronic, traditionally diet controlled however concern with significant weight gain noted.  Will return for POC A1c - scheduled lab appt. Patient is interested in [place]. Reviewed mechanism of action of medication as well as side effects and adverse events to watch for including nausea, diarrhea, constipation, pancreatitis. No fmhx medullary thyroid  cancer or MEN2. Discussed titration schedule for medication. Reviewed importance of strength training to prevent muscle loss. Will start Trulicity 0.75mg  weekly x1 month then increase to 1.5mg  weekly.

## 2024-03-17 NOTE — Progress Notes (Unsigned)
 Ph: [phone] Fax: [phone]   Patient ID: Neil Mcintyre, male    DOB: 1976/05/07, 48 y.o.   MRN: [id number]  This visit was conducted in person.  BP (!) 144/90 (BP Location: Left Arm)   Pulse 91   Temp 99 F (37.2 C) (Oral)   Ht 5' 7 (1.702 m)   Wt 295 lb 2 oz (133.9 kg)   SpO2 94%   BMI 46.22 kg/m   BP Readings from Last 3 Encounters:  03/17/24 (!) 144/90  02/05/24 (!) 185/106  01/18/24 (!) 137/92  160s/100 on repeat  testing  CC: discuss meds  Subjective:   HPI: Neil Mcintyre is a 48 y.o. male presenting on 03/17/2024 for Medication Management (Pt here for med management)   Last seen 03/2023. In interim seen at ER with abdominal pain, found ot have ometal infarction 02/2024 - ER records reviewed. Had similar issue last year with hospitalization for bowel resection due to diverticulitis perforation 07/2022 (recurrent colon bleeding at splenic flexure as well as incarcerated incisional hernia - extended left hemicolectomy, omentectomy, primary repair of incisional anastomosis).   HTN - Compliant with current antihypertensive regimen of amlodipine  5mg  daily, Toprol  XL 25 mg daily. Does not regularly check blood pressures at home. No low blood pressure readings or symptoms of dizziness/syncope. Denies HA, vision changes, CP/tightness, SOB, leg swelling.    DM - overdue for recheck of this. He would be interested in weekly injectable medication. He is adopted so unsure fmhx MTC or MEN2.  Lab Results  Component Value Date   HGBA1C 6.6 (A) 03/18/2023   Notes weight gain of 30 lbs in the past year, notes worse since surgery. Trouble losing weight. Has not used medications.   Feels he's sleeping well, restorative sleep, no daytime fatigue. + loud snoring without witnessed apnea or PNdyspnea.  ESS = 5 STOP-BANG = 4  High stress recently at work - this has improved with new employee.  Works at [employer].   Colonoscopy 06/11/2022 - 2 small polyps not removed,  diverticulosis, healthy anastomosis, non-bleeding int hem [place]) - rec rpt colonoscopy 1 year.      Relevant past medical, surgical, family and social history reviewed and updated as indicated. Interim medical history since our last visit reviewed. Allergies and medications reviewed and updated. Outpatient Medications Prior to Visit  Medication Sig Dispense Refill   EXCEDRIN MIGRAINE 250-250-65 MG tablet Take 1-2 tablets by mouth every 6 (six) hours as needed for headache or migraine.     MOTRIN  IB 200 MG tablet Take 200-400 mg by mouth every 6 (six) hours as needed for mild pain or headache.     amLODipine  (NORVASC ) 5 MG tablet TAKE 1 TABLET (5 MG TOTAL) BY MOUTH DAILY. 90 tablet 0   atorvastatin  (LIPITOR) 20 MG tablet Take 1 tablet (20 mg total) by mouth daily. 90 tablet 0   buPROPion  (WELLBUTRIN  XL) 150 MG 24 hr tablet Take 1 tablet (150 mg total) by mouth daily. 90 tablet 0   metoprolol  succinate (TOPROL -XL) 25 MG 24 hr tablet Take 1 tablet (25 mg total) by mouth daily. 90 tablet 0   sildenafil  (VIAGRA ) 100 MG tablet Take 0.5-1 tablets (50-100 mg total) by mouth daily as needed for erectile dysfunction. 9 tablet 3   valACYclovir  (VALTREX ) 500 MG tablet TAKE 1 TABLET (500 MG TOTAL) BY MOUTH DAILY. 90 tablet 1   doxycycline  (VIBRA -TABS) 100 MG tablet Take 1 tablet (100 mg total) by mouth 2 (two) times daily. (  Patient not taking: Reported on 03/17/2024) 14 tablet 0   No facility-administered medications prior to visit.     Per HPI unless specifically indicated in ROS section below Review of Systems  Objective:  BP (!) 144/90 (BP Location: Left Arm)   Pulse 91   Temp 99 F (37.2 C) (Oral)   Ht 5' 7 (1.702 m)   Wt 295 lb 2 oz (133.9 kg)   SpO2 94%   BMI 46.22 kg/m   Wt Readings from Last 3 Encounters:  03/17/24 295 lb 2 oz (133.9 kg)  03/18/23 269 lb 4 oz (122.1 kg)  03/09/23 247 lb (112 kg)      Physical Exam Vitals and nursing note reviewed.  Constitutional:       Appearance: Normal appearance. He is not ill-appearing.  HENT:     Head: Normocephalic and atraumatic.     Mouth/Throat:     Mouth: Mucous membranes are moist.     Pharynx: Oropharynx is clear. No oropharyngeal exudate or posterior oropharyngeal erythema.  Eyes:     Extraocular Movements: Extraocular movements intact.     Conjunctiva/sclera:     Right eye: Right conjunctiva is injected. No exudate.    Left eye: Left conjunctiva is not injected. No exudate.    Pupils: Pupils are equal, round, and reactive to light.     Comments: Bulbar > palpebral conjunctival injection with limbic sparing to right eye  Neck:     Thyroid : No thyroid  mass or thyromegaly.     Comments:  Neck circ 56cm  Cardiovascular:     Rate and Rhythm: Normal rate and regular rhythm.     Pulses: Normal pulses.     Heart sounds: Normal heart sounds. No murmur heard. Pulmonary:     Effort: Pulmonary effort is normal. No respiratory distress.     Breath sounds: Normal breath sounds. No wheezing, rhonchi or rales.  Musculoskeletal:     Cervical back: Normal range of motion and neck supple. No rigidity.     Right lower leg: No edema.     Left lower leg: No edema.  Lymphadenopathy:     Cervical: No cervical adenopathy.  Skin:    General: Skin is warm and dry.     Findings: No rash.  Neurological:     Mental Status: He is alert.  Psychiatric:        Mood and Affect: Mood normal.        Behavior: Behavior normal.       Results for orders placed or performed during the hospital encounter of 02/05/24  Lipase, blood   Collection Time: 02/05/24  7:56 PM  Result Value Ref Range   Lipase 25 11 - 51 U/L  Comprehensive metabolic panel   Collection Time: 02/05/24  7:56 PM  Result Value Ref Range   Sodium 136 135 - 145 mmol/L   Potassium 3.9 3.5 - 5.1 mmol/L   Chloride 101 98 - 111 mmol/L   CO2 24 22 - 32 mmol/L   Glucose, Bld 112 (H) 70 - 99 mg/dL   BUN 12 6 - 20 mg/dL   Creatinine, Ser 9.24 0.61 - 1.24 mg/dL    Calcium  9.2 8.9 - 10.3 mg/dL   Total Protein 6.6 6.5 - 8.1 g/dL   Albumin 3.7 3.5 - 5.0 g/dL   AST 14 (L) 15 - 41 U/L   ALT 18 0 - 44 U/L   Alkaline Phosphatase 90 38 - 126 U/L   Total Bilirubin 1.6 (H) 0.0 -  1.2 mg/dL   GFR, Estimated >39 >39 mL/min   Anion gap 11 5 - 15  CBC   Collection Time: 02/05/24  7:56 PM  Result Value Ref Range   WBC 15.6 (H) 4.0 - 10.5 K/uL   RBC 5.61 4.22 - 5.81 MIL/uL   Hemoglobin 15.9 13.0 - 17.0 g/dL   HCT 49.6 60.9 - 47.9 %   MCV 89.7 80.0 - 100.0 fL   MCH 28.3 26.0 - 34.0 pg   MCHC 31.6 30.0 - 36.0 g/dL   RDW 85.2 88.4 - 84.4 %   Platelets 258 150 - 400 K/uL   nRBC 0.0 0.0 - 0.2 %  Urinalysis, Routine w reflex microscopic -Urine, Clean Catch   Collection Time: 02/05/24  8:42 PM  Result Value Ref Range   Color, Urine YELLOW YELLOW   APPearance CLEAR CLEAR   Specific Gravity, Urine 1.025 1.005 - 1.030   pH 5.0 5.0 - 8.0   Glucose, UA NEGATIVE NEGATIVE mg/dL   Hgb urine dipstick NEGATIVE NEGATIVE   Bilirubin Urine NEGATIVE NEGATIVE   Ketones, ur 5 (A) NEGATIVE mg/dL   Protein, ur NEGATIVE NEGATIVE mg/dL   Nitrite NEGATIVE NEGATIVE   Leukocytes,Ua NEGATIVE NEGATIVE   Lab Results  Component Value Date   TSH 1.17 11/04/2017    Assessment & Plan:   Problem List Items Addressed This Visit     Type 2 diabetes mellitus with other specified complication (HCC)   Chronic, traditionally diet controlled however concern with significant weight gain noted.  Will return for POC A1c - scheduled lab appt. Patient is interested in Gueydan. Reviewed mechanism of action of medication as well as side effects and adverse events to watch for including nausea, diarrhea, constipation, pancreatitis. No fmhx medullary thyroid  cancer or MEN2. Discussed titration schedule for medication. Reviewed importance of strength training to prevent muscle loss. Will start Trulicity 0.75mg  weekly x1 month then increase to 1.5mg  weekly.       Relevant Medications    atorvastatin  (LIPITOR) 20 MG tablet   Dulaglutide (TRULICITY) 0.75 MG/0.5ML SOAJ   Dulaglutide (TRULICITY) 1.5 MG/0.5ML SOAJ (Start on 04/14/2024)   Other Relevant Orders   POCT glycosylated hemoglobin (Hb A1C)   Mood disorder   Relevant Medications   buPROPion  (WELLBUTRIN  XL) 150 MG 24 hr tablet   Other Visit Diagnoses       Polyp of colon, unspecified part of colon, unspecified type    -  Primary   Relevant Orders   Ambulatory referral to Gastroenterology        Meds ordered this encounter  Medications   amLODipine  (NORVASC ) 5 MG tablet    Sig: Take 1 tablet (5 mg total) by mouth daily.    Dispense:  90 tablet    Refill:  3   atorvastatin  (LIPITOR) 20 MG tablet    Sig: Take 1 tablet (20 mg total) by mouth daily.    Dispense:  90 tablet    Refill:  3   buPROPion  (WELLBUTRIN  XL) 150 MG 24 hr tablet    Sig: Take 1 tablet (150 mg total) by mouth daily.    Dispense:  90 tablet    Refill:  3   metoprolol  succinate (TOPROL -XL) 25 MG 24 hr tablet    Sig: Take 1 tablet (25 mg total) by mouth daily.    Dispense:  90 tablet    Refill:  3   valACYclovir  (VALTREX ) 500 MG tablet    Sig: Take 1 tablet (500 mg total) by mouth daily.  Dispense:  90 tablet    Refill:  3   Dulaglutide (TRULICITY) 0.75 MG/0.5ML SOAJ    Sig: Inject 0.75 mg into the skin once a week.    Dispense:  2 mL    Refill:  0   Dulaglutide (TRULICITY) 1.5 MG/0.5ML SOAJ    Sig: Inject 1.5 mg into the skin once a week.    Dispense:  2 mL    Refill:  3    Orders Placed This Encounter  Procedures   Ambulatory referral to Gastroenterology    Referral Priority:   Routine    Referral Type:   Consultation    Referral Reason:   Specialty Services Required    Number of Visits Requested:   1   POCT glycosylated hemoglobin (Hb A1C)    Standing Status:   Future    Expiration Date:   04/17/2024    Patient Instructions  I will refer you to [facility] in [place] to discuss repeat colonoscopy.   BP was  too high - start monitoring at home with BP log sheet provided today Your goal blood pressure is <140/90. Work on low salt/sodium diet - goal <2 grams (2,000mg ) per day. Eat a diet high in fruits/vegetables and whole grains. Look into mediterranean and DASH diet. Goal activity is 193min/wk of moderate intensity exercise.  This can be split into 30 minute chunks.  If you are not at this level, you can start with smaller 10-15 min increments and slowly build up activity. Look at www.heart.org for more resources   For weight loss -check with insurance on coverage for Wegovy, Zepbound or Saxenda for weight loss.  Return in 3 months for physical. Let me know sooner if BP staying consistently >140/90  Follow up plan: Return in about 3 months (around 06/17/2024) for annual exam, prior fasting for blood work.  Anton Blas, MD

## 2024-03-18 ENCOUNTER — Encounter: Payer: Self-pay | Admitting: Family Medicine

## 2024-03-18 DIAGNOSIS — R0683 Snoring: Secondary | ICD-10-CM | POA: Insufficient documentation

## 2024-03-18 DIAGNOSIS — K635 Polyp of colon: Secondary | ICD-10-CM | POA: Insufficient documentation

## 2024-03-18 DIAGNOSIS — H5789 Other specified disorders of eye and adnexa: Secondary | ICD-10-CM | POA: Insufficient documentation

## 2024-03-18 NOTE — Assessment & Plan Note (Signed)
 Recent recurrent omental infarct s/p ER eval, treated with pain control

## 2024-03-18 NOTE — Assessment & Plan Note (Signed)
 Suspect possible allergic reaction as this tends to happen at same time every afternoon.  Recommend regularly using lubricating eye drops, could try patanol or other antihistamine allergy eye drop.  If not improving, rec f/u with eye doctor - he has upcoming appointment.

## 2024-03-18 NOTE — Assessment & Plan Note (Signed)
 Loud snoring with increased neck circumference. Negative ESS screen however screens high risk with STOP-BANG and in HTN//DM hx.  Reviewed possible OSA diagnosis and effect on cardiac and pulmonary health, as well as could contribute to trouble losing weight and difficult to control hypertension.  Agrees to proceed with HST which I will order through [facility].

## 2024-03-18 NOTE — Assessment & Plan Note (Signed)
 Chronic, BP remains above goal.  This is despite 2 drug regimen. I asked him to start monitoring BP at home, BP log sheet provided, let me know if >140/90 for med titration

## 2024-03-18 NOTE — Assessment & Plan Note (Addendum)
 2 polyps noted to descending colon on colonoscopy during hospitalization 06/2022 for diverticular bleed. He actually underwent left colon resection (hemicolectomy) on 07/2022 - from mid transverse colon including descending colon) so may not need repeat colonoscopy.  I will ask him to contact [facility] [place]) to see when next colonoscopy is due [phone] )

## 2024-03-18 NOTE — Assessment & Plan Note (Addendum)
 Chronic, stable period on wellbutrin  - continue.

## 2024-03-18 NOTE — Assessment & Plan Note (Signed)
 Weight gain noted. Encouraged healthy diet and lifestyle choices.  Start GLP1RA as per above in h/o diabetes .

## 2024-03-31 ENCOUNTER — Other Ambulatory Visit

## 2024-03-31 DIAGNOSIS — E1169 Type 2 diabetes mellitus with other specified complication: Secondary | ICD-10-CM

## 2024-03-31 LAB — POCT GLYCOSYLATED HEMOGLOBIN (HGB A1C): Hemoglobin A1C: 6.8 % — AB (ref 4.0–5.6)

## 2024-04-08 ENCOUNTER — Ambulatory Visit: Payer: Self-pay | Admitting: Family Medicine

## 2024-04-15 ENCOUNTER — Encounter: Payer: Self-pay | Admitting: Family Medicine

## 2024-04-24 ENCOUNTER — Encounter (INDEPENDENT_AMBULATORY_CARE_PROVIDER_SITE_OTHER): Payer: Self-pay | Admitting: Family Medicine

## 2024-04-24 DIAGNOSIS — R209 Unspecified disturbances of skin sensation: Secondary | ICD-10-CM

## 2024-04-28 DIAGNOSIS — R209 Unspecified disturbances of skin sensation: Secondary | ICD-10-CM | POA: Diagnosis not present

## 2024-04-28 MED ORDER — VITAMIN B-12 1000 MCG PO TABS: 1000.0000 ug | ORAL_TABLET | Freq: Every day | ORAL | Status: AC

## 2024-04-28 NOTE — Telephone Encounter (Signed)
 Copied from CRM #[id number]. Topic: Clinical - Medical Advice >> Apr 28, 2024 12:07 PM Alexandria E wrote: Reason for CRM: Patient is having erectile dysfunction issues along with not have any sensitivity. Patient put a message in his MyChart on 11/22 without a response yet. Offered to make an OV for the patient, but denied as his schedule does not allow for this. Patient did say he is not experiencing pain.

## 2024-04-28 NOTE — Telephone Encounter (Signed)
Please see the MyChart message reply(ies) for my assessment and plan.  The patient gave consent for this Medical Advice Message and is aware that it may result in a bill to their insurance company as well as the possibility that this may result in a co-payment or deductible. They are an established patient, but are not seeking medical advice exclusively about a problem treated during an in person or video visit in the last 7 days. I did not recommend an in person or video visit within 7 days of my reply.  I spent a total of 6 minutes cumulative time within 7 days through MyChart messaging Eustaquio Boyden, MD

## 2024-05-03 ENCOUNTER — Telehealth: Payer: Self-pay

## 2024-05-03 DIAGNOSIS — R0683 Snoring: Secondary | ICD-10-CM

## 2024-05-03 NOTE — Telephone Encounter (Signed)
 Would hold open in case pt calls back to schedule

## 2024-05-03 NOTE — Telephone Encounter (Signed)
 Ok to close referral or do you want me to reach out to patient?

## 2024-05-04 ENCOUNTER — Encounter: Payer: Self-pay | Admitting: Family Medicine

## 2024-05-06 NOTE — Telephone Encounter (Signed)
 Called and spoke to pt , he stated that he no longer wants to do the at home sleep study at home and would rather go in to have it done at a facility in [place].

## 2024-05-07 NOTE — Addendum Note (Signed)
 Addended by: RILLA BALLER on: 05/07/2024 07:10 AM   Modules accepted: Orders

## 2024-05-07 NOTE — Telephone Encounter (Signed)
 May close HST referral order.  I have placed new referral to [facility] in [place].

## 2024-05-11 ENCOUNTER — Ambulatory Visit: Admitting: Internal Medicine

## 2024-05-12 NOTE — Telephone Encounter (Signed)
 This has been addressed. He has pulm appt next week.

## 2024-05-18 ENCOUNTER — Encounter: Payer: Self-pay | Admitting: Sleep Medicine

## 2024-05-18 ENCOUNTER — Ambulatory Visit: Admitting: Sleep Medicine

## 2024-05-18 VITALS — BP 126/80 | HR 95 | Temp 98.1°F | Ht 66.25 in | Wt 277.8 lb

## 2024-05-18 DIAGNOSIS — I10 Essential (primary) hypertension: Secondary | ICD-10-CM

## 2024-05-18 DIAGNOSIS — G4733 Obstructive sleep apnea (adult) (pediatric): Secondary | ICD-10-CM

## 2024-05-18 NOTE — Progress Notes (Signed)
 Name:Neil Mcintyre MRN: [id number] DOB: 03/06/1976   CHIEF COMPLAINT:  EXCESSIVE DAYTIME SLEEPINESS   HISTORY OF PRESENT ILLNESS: Neil Mcintyre is a 48 y.o. w/ a h/o HTN, hyperlipidemia, morbid obesity and depression who present for c/o loud snoring, witnessed apnea and occasional daytime sleepiness which has been present for several years. Reports nocturnal awakenings due to nocturia, however does not have difficulty falling back to sleep. Reports significant weight changes. Admits to dry mouth. Denies morning headaches, RLS symptoms, dream enactment, cataplexy, hypnagogic or hypnapompic hallucinations. Denies a family history of sleep apnea. Reports occasional drowsy driving. Drinks 1 cup of coffee daily, occasional alcohol use, former smoker, denies illicit drug use.   Bedtime 9-11 pm Sleep onset 10 mins Rise time 6:30 am   EPWORTH SLEEP SCORE     05/18/2024    8:00 AM  Results of the Epworth flowsheet  Sitting and reading 0  Watching TV 0  Sitting, inactive in a public place (e.g. a theatre or a meeting) 0  As a passenger in a car for an hour without a break 0  Lying down to rest in the afternoon when circumstances permit 0  Sitting and talking to someone 0  Sitting quietly after a lunch without alcohol 0  In a car, while stopped for a few minutes in traffic 0  Total score 0      PAST MEDICAL HISTORY :   has a past medical history of Anxiety, COVID-19 (05/10/2021), Depression, Diabetes mellitus without complication (HCC), Diverticulitis of colon with bleeding (07/18/2022), Diverticulitis of large intestine with perforation with bleeding (2015, 2017), Genital herpes, Hypertension, and Irritability and anger.  has a past surgical history that includes Colonoscopy w/ polypectomy (09/2014); Laparoscopic sigmoid colectomy (N/A, 08/16/2015); Cystoscopy w/ retrogrades (Bilateral, 08/16/2015); Cystoscopy with stent placement (Bilateral, 08/16/2015); Refractive surgery (1999);  Colonoscopy with propofol  (N/A, 06/11/2022); Proctoscopy (N/A, 07/18/2022); and Colon resection (07/2022). Prior to Admission medications  Medication Sig Start Date End Date Taking? Authorizing Provider  amLODipine  (NORVASC ) 5 MG tablet Take 1 tablet (5 mg total) by mouth daily. 03/17/24 05/18/24 Yes Rilla Baller, MD  atorvastatin  (LIPITOR) 20 MG tablet Take 1 tablet (20 mg total) by mouth daily. 03/17/24  Yes Rilla Baller, MD  buPROPion  (WELLBUTRIN  XL) 150 MG 24 hr tablet Take 1 tablet (150 mg total) by mouth daily. 03/17/24  Yes Rilla Baller, MD  cyanocobalamin  (VITAMIN B12) 1000 MCG tablet Take 1 tablet (1,000 mcg total) by mouth daily. 04/28/24  Yes Rilla Baller, MD  Dulaglutide  (TRULICITY ) 0.75 MG/0.5ML SOAJ Inject 0.75 mg into the skin once a week. 03/17/24  Yes Rilla Baller, MD  Dulaglutide  (TRULICITY ) 1.5 MG/0.5ML SOAJ Inject 1.5 mg into the skin once a week. 04/14/24  Yes Rilla Baller, MD  EXCEDRIN MIGRAINE 250-250-65 MG tablet Take 1-2 tablets by mouth every 6 (six) hours as needed for headache or migraine.   Yes [provider]  metoprolol  succinate (TOPROL -XL) 25 MG 24 hr tablet Take 1 tablet (25 mg total) by mouth daily. 03/17/24  Yes Rilla Baller, MD  MOTRIN  IB 200 MG tablet Take 200-400 mg by mouth every 6 (six) hours as needed for mild pain or headache.   Yes [provider]  valACYclovir  (VALTREX ) 500 MG tablet Take 1 tablet (500 mg total) by mouth daily. 03/17/24  Yes Rilla Baller, MD   Allergies[1]  FAMILY HISTORY:  He was adopted. Family history is unknown by patient. SOCIAL HISTORY:  reports that he has never smoked. He  has quit using smokeless tobacco.  His smokeless tobacco use included chew and snuff. He reports that he does not currently use alcohol after a past usage of about 1.0 standard drink of alcohol per week. He reports that he does not use drugs.   Review of Systems:  Gen:  Denies  fever, sweats, chills  weight loss  HEENT: Denies blurred vision, double vision, ear pain, eye pain, hearing loss, nose bleeds, sore throat Cardiac:  No dizziness, chest pain or heaviness, chest tightness,edema, No JVD Resp:   No cough, -sputum production, -shortness of breath,-wheezing, -hemoptysis,  Gi: Denies swallowing difficulty, stomach pain, nausea or vomiting, diarrhea, constipation, bowel incontinence Gu:  Denies bladder incontinence, burning urine Ext:   Denies Joint pain, stiffness or swelling Skin: Denies  skin rash, easy bruising or bleeding or hives Endoc:  Denies polyuria, polydipsia , polyphagia or weight change Psych:   Denies depression, insomnia or hallucinations  Other:  All other systems negative  VITAL SIGNS: BP 126/80   Pulse 95   Temp 98.1 F (36.7 C)   Ht 5' 6.25 (1.683 m)   Wt 277 lb 12.8 oz (126 kg)   SpO2 94%   BMI 44.50 kg/m    Physical Examination:   General Appearance: No distress  EYES PERRLA, EOM intact.   NECK Supple, No JVD Pulmonary: normal breath sounds, No wheezing.  CardiovascularNormal S1,S2.  No m/r/g.   Abdomen: Benign, Soft, non-tender. Skin:   warm, no rashes, no ecchymosis  Extremities: normal, no cyanosis, clubbing. Neuro:without focal findings,  speech normal  PSYCHIATRIC: Mood, affect within normal limits.   ASSESSMENT AND PLAN  OSA I suspect that OSA is likely present due to clinical presentation. Discussed the consequences of untreated sleep apnea. Advised not to drive drowsy for safety of patient and others. Will complete further evaluation with a home sleep study and follow up to review results.    HTN Stable, on current management. Following with PCP.   Morbid obesity Counseled patient on diet and lifestyle modification.    MEDICATION ADJUSTMENTS/LABS AND TESTS ORDERED: Recommend Sleep Study   Patient  satisfied with Plan of action and management. All questions answered  Follow up to review HST results and treatment plan.   I  spent a total of 48 minutes reviewing chart data, face-to-face evaluation with the patient, counseling and coordination of care as detailed above.    Gwendolin Briel, M.D.  Sleep Medicine [facility]           [1] No Known Allergies

## 2024-05-18 NOTE — Patient Instructions (Signed)
 SABRA

## 2024-05-26 ENCOUNTER — Encounter: Payer: Self-pay | Admitting: Family Medicine

## 2024-06-06 ENCOUNTER — Encounter: Payer: Self-pay | Admitting: Family Medicine

## 2024-06-06 DIAGNOSIS — R3912 Poor urinary stream: Secondary | ICD-10-CM

## 2024-06-06 DIAGNOSIS — N529 Male erectile dysfunction, unspecified: Secondary | ICD-10-CM

## 2024-06-07 NOTE — Telephone Encounter (Signed)
 Copied from CRM [id number]. Topic: Clinical - Medical Advice >> Jun 07, 2024 11:15 AM Avram MATSU wrote: Reason for CRM: patient is following up regarding his message in [place]. Please advise [phone]

## 2024-06-10 NOTE — Telephone Encounter (Signed)
 Patient viewed mychart message.   Last read by Neil Mcintyre at 8:41AM on 06/09/2024.

## 2024-06-17 ENCOUNTER — Telehealth: Payer: Self-pay | Admitting: Sleep Medicine

## 2024-06-17 NOTE — Telephone Encounter (Signed)
 Noted. NFN

## 2024-06-17 NOTE — Telephone Encounter (Signed)
 Dr. Jess saw the patient on 05/18/24 and placed an order for in lab sleep study. The order was sent to [facility] and we have received a note from them letting us  know the patients insurance denied and in lab sleep study. There was an order placed for HST and it was sent to [facility] the patient CXL the order with them. [facility] has requested an order for HST which I have sent maybe the can work it out with the patient so he can at least do the HST  Patients comments along the way After talking with a co-worker. They did the at home sleep study and did not have a good testing. And had to go in and get it done. Can we just get it done right the first time. I rather just go in and get it done at the sleep study place.   I cannot afford a 275 bill right now. I can barely afford to feed my family and on top of it all our heat went out. So you can see our situation isnt good right now. If I get a bill I wont be able to pay it. My family comes first. If I die for sleep apnea then oh well no one can say I did not put my family first
